# Patient Record
Sex: Female | Born: 1960 | Race: Black or African American | Hispanic: No | Marital: Married | Smoking: Never smoker
Health system: Southern US, Community
[De-identification: ages and names within clinical notes are randomized; demographics above are authoritative.]

## PROBLEM LIST (undated history)

## (undated) DIAGNOSIS — H269 Unspecified cataract: Secondary | ICD-10-CM

## (undated) DIAGNOSIS — R32 Unspecified urinary incontinence: Secondary | ICD-10-CM

## (undated) DIAGNOSIS — M1990 Unspecified osteoarthritis, unspecified site: Secondary | ICD-10-CM

## (undated) DIAGNOSIS — L509 Urticaria, unspecified: Secondary | ICD-10-CM

## (undated) DIAGNOSIS — R4789 Other speech disturbances: Secondary | ICD-10-CM

## (undated) DIAGNOSIS — T7840XA Allergy, unspecified, initial encounter: Secondary | ICD-10-CM

## (undated) DIAGNOSIS — N949 Unspecified condition associated with female genital organs and menstrual cycle: Secondary | ICD-10-CM

## (undated) DIAGNOSIS — E669 Obesity, unspecified: Secondary | ICD-10-CM

## (undated) DIAGNOSIS — N938 Other specified abnormal uterine and vaginal bleeding: Secondary | ICD-10-CM

## (undated) DIAGNOSIS — N63 Unspecified lump in unspecified breast: Secondary | ICD-10-CM

## (undated) DIAGNOSIS — F40240 Claustrophobia: Secondary | ICD-10-CM

## (undated) DIAGNOSIS — H532 Diplopia: Secondary | ICD-10-CM

## (undated) DIAGNOSIS — N925 Other specified irregular menstruation: Secondary | ICD-10-CM

## (undated) DIAGNOSIS — I10 Essential (primary) hypertension: Secondary | ICD-10-CM

## (undated) HISTORY — PX: WISDOM TOOTH EXTRACTION: SHX21

## (undated) HISTORY — DX: Unspecified osteoarthritis, unspecified site: M19.90

## (undated) HISTORY — DX: Allergy, unspecified, initial encounter: T78.40XA

## (undated) HISTORY — PX: COLONOSCOPY: SHX174

## (undated) HISTORY — DX: Claustrophobia: F40.240

## (undated) HISTORY — DX: Obesity, unspecified: E66.9

## (undated) HISTORY — DX: Unspecified condition associated with female genital organs and menstrual cycle: N94.9

## (undated) HISTORY — DX: Other specified irregular menstruation: N92.5

## (undated) HISTORY — DX: Other specified abnormal uterine and vaginal bleeding: N93.8

## (undated) HISTORY — DX: Urticaria, unspecified: L50.9

## (undated) HISTORY — DX: Unspecified cataract: H26.9

## (undated) HISTORY — DX: Essential (primary) hypertension: I10

## (undated) HISTORY — DX: Other speech disturbances: R47.89

## (undated) HISTORY — DX: Diplopia: H53.2

## (undated) HISTORY — DX: Unspecified lump in unspecified breast: N63.0

## (undated) HISTORY — PX: BREAST BIOPSY: SHX20

---

## 2000-08-11 ENCOUNTER — Other Ambulatory Visit: Admission: RE | Admit: 2000-08-11 | Discharge: 2000-08-11 | Payer: Self-pay | Admitting: Obstetrics and Gynecology

## 2000-08-18 ENCOUNTER — Encounter: Payer: Self-pay | Admitting: Obstetrics and Gynecology

## 2000-08-18 ENCOUNTER — Ambulatory Visit (HOSPITAL_COMMUNITY): Admission: RE | Admit: 2000-08-18 | Discharge: 2000-08-18 | Payer: Self-pay | Admitting: Obstetrics and Gynecology

## 2001-08-17 ENCOUNTER — Other Ambulatory Visit: Admission: RE | Admit: 2001-08-17 | Discharge: 2001-08-17 | Payer: Self-pay | Admitting: Obstetrics and Gynecology

## 2001-10-01 ENCOUNTER — Encounter: Admission: RE | Admit: 2001-10-01 | Discharge: 2001-10-01 | Payer: Self-pay | Admitting: Obstetrics and Gynecology

## 2001-10-01 ENCOUNTER — Encounter: Payer: Self-pay | Admitting: Obstetrics and Gynecology

## 2002-08-20 ENCOUNTER — Other Ambulatory Visit: Admission: RE | Admit: 2002-08-20 | Discharge: 2002-08-20 | Payer: Self-pay | Admitting: Obstetrics and Gynecology

## 2003-02-14 ENCOUNTER — Ambulatory Visit (HOSPITAL_COMMUNITY): Admission: RE | Admit: 2003-02-14 | Discharge: 2003-02-14 | Payer: Self-pay | Admitting: Cardiology

## 2003-06-12 ENCOUNTER — Encounter: Payer: Self-pay | Admitting: Obstetrics and Gynecology

## 2003-06-12 ENCOUNTER — Encounter: Admission: RE | Admit: 2003-06-12 | Discharge: 2003-06-12 | Payer: Self-pay | Admitting: Obstetrics and Gynecology

## 2003-12-22 ENCOUNTER — Other Ambulatory Visit: Admission: RE | Admit: 2003-12-22 | Discharge: 2003-12-22 | Payer: Self-pay | Admitting: Obstetrics and Gynecology

## 2005-07-01 ENCOUNTER — Encounter: Admission: RE | Admit: 2005-07-01 | Discharge: 2005-07-01 | Payer: Self-pay | Admitting: Obstetrics and Gynecology

## 2005-07-18 ENCOUNTER — Other Ambulatory Visit: Admission: RE | Admit: 2005-07-18 | Discharge: 2005-07-18 | Payer: Self-pay | Admitting: Obstetrics and Gynecology

## 2005-07-27 ENCOUNTER — Ambulatory Visit (HOSPITAL_COMMUNITY): Admission: RE | Admit: 2005-07-27 | Discharge: 2005-07-27 | Payer: Self-pay | Admitting: Obstetrics and Gynecology

## 2006-04-10 ENCOUNTER — Ambulatory Visit: Payer: Self-pay | Admitting: Family Medicine

## 2006-11-08 ENCOUNTER — Encounter: Admission: RE | Admit: 2006-11-08 | Discharge: 2006-11-08 | Payer: Self-pay | Admitting: Obstetrics and Gynecology

## 2007-05-09 ENCOUNTER — Ambulatory Visit: Payer: Self-pay | Admitting: Family Medicine

## 2007-05-09 LAB — CONVERTED CEMR LAB
ALT: 16 units/L (ref 0–40)
Basophils Absolute: 0 10*3/uL (ref 0.0–0.1)
Basophils Relative: 0.5 % (ref 0.0–1.0)
CO2: 26 meq/L (ref 19–32)
Calcium: 9.6 mg/dL (ref 8.4–10.5)
Cholesterol: 210 mg/dL (ref 0–200)
Eosinophils Absolute: 0.1 10*3/uL (ref 0.0–0.6)
Eosinophils Relative: 2.2 % (ref 0.0–5.0)
GFR calc Af Amer: 76 mL/min
GFR calc non Af Amer: 63 mL/min
HCT: 38.9 % (ref 36.0–46.0)
HDL: 92.5 mg/dL (ref >39.0)
Hemoglobin: 13 g/dL (ref 12.0–15.0)
Lymphocytes Relative: 33.1 % (ref 12.0–46.0)
MCHC: 33.5 g/dL (ref 30.0–36.0)
Monocytes Absolute: 0.5 10*3/uL (ref 0.2–0.7)
Neutro Abs: 3.4 10*3/uL (ref 1.4–7.7)
Platelets: 238 10*3/uL (ref 150–400)
RBC: 4.55 M/uL (ref 3.87–5.11)
RDW: 13.1 % (ref 11.5–14.6)
Sodium: 139 meq/L (ref 135–145)
TSH: 0.98 microintl units/mL (ref 0.35–5.50)
Triglycerides: 58 mg/dL (ref 0–149)

## 2007-05-17 ENCOUNTER — Ambulatory Visit: Payer: Self-pay | Admitting: Family Medicine

## 2007-11-07 ENCOUNTER — Encounter: Admission: RE | Admit: 2007-11-07 | Discharge: 2007-11-07 | Payer: Self-pay | Admitting: Obstetrics and Gynecology

## 2007-11-21 ENCOUNTER — Ambulatory Visit: Payer: Self-pay | Admitting: Family Medicine

## 2007-11-21 LAB — CONVERTED CEMR LAB
Mumps IgG: 3.44 — ABNORMAL HIGH
Rubeola IgG: 3.01 — ABNORMAL HIGH

## 2007-11-28 ENCOUNTER — Telehealth: Payer: Self-pay | Admitting: *Deleted

## 2007-12-17 ENCOUNTER — Telehealth: Payer: Self-pay | Admitting: Family Medicine

## 2008-03-26 ENCOUNTER — Telehealth: Payer: Self-pay | Admitting: Family Medicine

## 2008-04-04 ENCOUNTER — Ambulatory Visit: Payer: Self-pay | Admitting: Family Medicine

## 2008-05-13 ENCOUNTER — Ambulatory Visit: Payer: Self-pay | Admitting: Family Medicine

## 2008-05-27 ENCOUNTER — Ambulatory Visit: Payer: Self-pay | Admitting: Family Medicine

## 2008-05-27 LAB — CONVERTED CEMR LAB
Albumin: 3.7 g/dL (ref 3.5–5.2)
Alkaline Phosphatase: 44 units/L (ref 39–117)
Basophils Relative: 0.2 % (ref 0.0–1.0)
Bilirubin Urine: NEGATIVE
Chloride: 105 meq/L (ref 96–112)
Creatinine, Ser: 1 mg/dL (ref 0.4–1.2)
GFR calc Af Amer: 76 mL/min
Glucose, Urine, Semiquant: NEGATIVE
HDL: 78.6 mg/dL (ref >39.0)
Hemoglobin: 12.3 g/dL (ref 12.0–15.0)
Lymphocytes Relative: 37.3 % (ref 12.0–46.0)
MCHC: 33.5 g/dL (ref 30.0–36.0)
Monocytes Absolute: 0.5 10*3/uL (ref 0.1–1.0)
Monocytes Relative: 9.9 % (ref 3.0–12.0)
Neutro Abs: 2.6 10*3/uL (ref 1.4–7.7)
Neutrophils Relative %: 50.6 % (ref 43.0–77.0)
Nitrite: NEGATIVE
Platelets: 208 10*3/uL (ref 150–400)
Potassium: 4.1 meq/L (ref 3.5–5.1)
RBC: 4.22 M/uL (ref 3.87–5.11)
Specific Gravity, Urine: 1.02
Triglycerides: 35 mg/dL (ref 0–149)
VLDL: 7 mg/dL (ref 0–40)
WBC Urine, dipstick: NEGATIVE
WBC: 5.1 10*3/uL (ref 4.5–10.5)
pH: 6.5

## 2008-06-02 ENCOUNTER — Other Ambulatory Visit: Admission: RE | Admit: 2008-06-02 | Discharge: 2008-06-02 | Payer: Self-pay | Admitting: Family Medicine

## 2008-06-02 ENCOUNTER — Encounter: Payer: Self-pay | Admitting: Family Medicine

## 2008-06-02 ENCOUNTER — Ambulatory Visit: Payer: Self-pay | Admitting: Family Medicine

## 2009-02-17 ENCOUNTER — Ambulatory Visit: Payer: Self-pay | Admitting: Family Medicine

## 2009-05-20 ENCOUNTER — Telehealth: Payer: Self-pay | Admitting: *Deleted

## 2009-06-26 ENCOUNTER — Telehealth (INDEPENDENT_AMBULATORY_CARE_PROVIDER_SITE_OTHER): Payer: Self-pay | Admitting: *Deleted

## 2009-06-29 ENCOUNTER — Encounter: Admission: RE | Admit: 2009-06-29 | Discharge: 2009-06-29 | Payer: Self-pay | Admitting: Family Medicine

## 2009-06-29 ENCOUNTER — Ambulatory Visit: Payer: Self-pay | Admitting: Family Medicine

## 2009-06-29 LAB — CONVERTED CEMR LAB
ALT: 15 U/L (ref 0–35)
AST: 17 U/L (ref 0–37)
Albumin: 3.5 g/dL (ref 3.5–5.2)
Alkaline Phosphatase: 44 U/L (ref 39–117)
BUN: 19 mg/dL (ref 6–23)
Basophils Absolute: 0 K/uL (ref 0.0–0.1)
Basophils Relative: 0.3 % (ref 0.0–3.0)
Bilirubin Urine: NEGATIVE
Bilirubin, Direct: 0.1 mg/dL (ref 0.0–0.3)
CO2: 30 meq/L (ref 19–32)
Calcium: 9.1 mg/dL (ref 8.4–10.5)
Chloride: 110 meq/L (ref 96–112)
Cholesterol: 178 mg/dL (ref 0–200)
Creatinine, Ser: 1 mg/dL (ref 0.4–1.2)
Eosinophils Absolute: 0.1 K/uL (ref 0.0–0.7)
Eosinophils Relative: 2.3 % (ref 0.0–5.0)
Folate: 11.9 ng/mL
GFR calc non Af Amer: 75.44 mL/min (ref >60)
Glucose, Bld: 93 mg/dL (ref 70–99)
HCT: 37 % (ref 36.0–46.0)
HDL: 88.1 mg/dL (ref >39.00)
Hemoglobin, Urine: NEGATIVE
Hemoglobin: 12.4 g/dL (ref 12.0–15.0)
Iron: 97 ug/dL (ref 42–145)
Ketones, ur: NEGATIVE mg/dL
LDL Cholesterol: 85 mg/dL (ref 0–99)
Leukocytes, UA: NEGATIVE
Lymphocytes Relative: 35.7 % (ref 12.0–46.0)
Lymphs Abs: 2.3 K/uL (ref 0.7–4.0)
MCHC: 33.5 g/dL (ref 30.0–36.0)
MCV: 85.7 fL (ref 78.0–100.0)
Monocytes Absolute: 0.7 K/uL (ref 0.1–1.0)
Monocytes Relative: 11.2 % (ref 3.0–12.0)
Neutro Abs: 3.3 K/uL (ref 1.4–7.7)
Neutrophils Relative %: 50.5 % (ref 43.0–77.0)
Nitrite: NEGATIVE
Platelets: 198 K/uL (ref 150.0–400.0)
Potassium: 4.2 meq/L (ref 3.5–5.1)
RBC: 4.32 M/uL (ref 3.87–5.11)
RDW: 12.8 % (ref 11.5–14.6)
Saturation Ratios: 31.5 % (ref 20.0–50.0)
Sodium: 144 meq/L (ref 135–145)
Specific Gravity, Urine: 1.02 (ref 1.000–1.030)
TSH: 0.8 u[IU]/mL (ref 0.35–5.50)
Total Bilirubin: 0.7 mg/dL (ref 0.3–1.2)
Total CHOL/HDL Ratio: 2
Total Protein, Urine: NEGATIVE mg/dL
Total Protein: 6.8 g/dL (ref 6.0–8.3)
Transferrin: 220 mg/dL (ref 212.0–360.0)
Triglycerides: 26 mg/dL (ref 0.0–149.0)
Urine Glucose: NEGATIVE mg/dL
Urobilinogen, UA: 0.2 (ref 0.0–1.0)
VLDL: 5.2 mg/dL (ref 0.0–40.0)
Vitamin B-12: 529 pg/mL (ref 211–911)
WBC: 6.4 10*3/microliter (ref 4.5–10.5)
pH: 6.5 (ref 5.0–8.0)

## 2009-07-06 ENCOUNTER — Ambulatory Visit: Payer: Self-pay | Admitting: Family Medicine

## 2009-07-06 ENCOUNTER — Other Ambulatory Visit: Admission: RE | Admit: 2009-07-06 | Discharge: 2009-07-06 | Payer: Self-pay | Admitting: Family Medicine

## 2009-07-06 ENCOUNTER — Encounter: Payer: Self-pay | Admitting: Family Medicine

## 2009-07-06 DIAGNOSIS — N952 Postmenopausal atrophic vaginitis: Secondary | ICD-10-CM

## 2009-07-17 ENCOUNTER — Ambulatory Visit: Payer: Self-pay | Admitting: Gastroenterology

## 2009-07-29 ENCOUNTER — Ambulatory Visit: Payer: Self-pay | Admitting: Gastroenterology

## 2010-03-08 ENCOUNTER — Telehealth: Payer: Self-pay | Admitting: Family Medicine

## 2010-03-17 ENCOUNTER — Encounter: Payer: Self-pay | Admitting: Family Medicine

## 2010-03-30 ENCOUNTER — Encounter: Payer: Self-pay | Admitting: Family Medicine

## 2010-03-30 DIAGNOSIS — N852 Hypertrophy of uterus: Secondary | ICD-10-CM

## 2010-03-31 ENCOUNTER — Encounter: Admission: RE | Admit: 2010-03-31 | Discharge: 2010-03-31 | Payer: Self-pay | Admitting: Family Medicine

## 2010-04-01 ENCOUNTER — Ambulatory Visit: Payer: Self-pay | Admitting: Family Medicine

## 2010-10-20 ENCOUNTER — Telehealth: Payer: Self-pay | Admitting: Family Medicine

## 2010-10-25 ENCOUNTER — Encounter: Admission: RE | Admit: 2010-10-25 | Discharge: 2010-10-25 | Payer: Self-pay | Admitting: Family Medicine

## 2010-11-10 ENCOUNTER — Encounter: Payer: Self-pay | Admitting: Family Medicine

## 2010-11-25 ENCOUNTER — Ambulatory Visit: Payer: Self-pay | Admitting: Family Medicine

## 2010-11-25 ENCOUNTER — Other Ambulatory Visit
Admission: RE | Admit: 2010-11-25 | Discharge: 2010-11-25 | Payer: Self-pay | Source: Home / Self Care | Admitting: Family Medicine

## 2010-11-25 ENCOUNTER — Encounter: Payer: Self-pay | Admitting: Family Medicine

## 2010-11-25 LAB — CONVERTED CEMR LAB: Pap Smear: NEGATIVE

## 2011-01-11 NOTE — Miscellaneous (Signed)
Summary: Orders Update  Clinical Lists Changes  Problems: Added new problem of UTERINE ENLARGEMENT (ICD-621.2) Orders: Added new Referral order of Misc. Referral (Misc. Ref) - Signed

## 2011-01-11 NOTE — Progress Notes (Signed)
Summary: fluconazole rx  Phone Note From Pharmacy   Summary of Call: patient is requesting an rx for fluconazole is this okay to fill? Initial call taken by: Kern Reap CMA Duncan Dull),  March 08, 2010 12:55 PM  Follow-up for Phone Call        Fleet Contras please call....... what's the reason?????? Follow-up by: Roderick Pee MD,  March 08, 2010 1:48 PM  Additional Follow-up for Phone Call Additional follow up Details #1::        left message on machine for patient  Additional Follow-up by: Kern Reap CMA Duncan Dull),  March 08, 2010 3:15 PM    Additional Follow-up for Phone Call Additional follow up Details #2::    left message for pt to call back and faxed request to pharmacy Follow-up by: Kern Reap CMA Duncan Dull),  March 09, 2010 1:46 PM

## 2011-01-11 NOTE — Progress Notes (Signed)
Summary: Pt is req to get order for bone density at time of mammogram  Phone Note Call from Patient Call back at [phone] cell   Caller: Patient Summary of Call: Pt is req to get a bone density test done at time of mammogram on 10/25/10 at [facility] or pt can have it done at her job. Pt works for [employer].   Pt has sch a cpx with Dr. Tawanna Cooler on 11/25/10 and is wanting to know if she can get an order to have cpx labs done at her work? Pls advise.  Initial call taken by: Lucy Antigua,  October 20, 2010 4:40 PM  Follow-up for Phone Call        Fleet Contras please call bone density.  No problem..................  She wants to get it done at work........Marland Kitchen  Also ok to  get all her CPX lab [place] or Follow-up by: Roderick Pee MD,  October 21, 2010 3:09 PM  Additional Follow-up for Phone Call Additional follow up Details #1::        Phone Call Completed Additional Follow-up by: Kern Reap CMA Duncan Dull),  October 21, 2010 4:28 PM

## 2011-01-11 NOTE — Assessment & Plan Note (Signed)
Summary: 2 day rov/njr   Vital Signs:  Patient profile:   50 year old female Menstrual status:  irregular Height:      66 inches Weight:      187 pounds BMI:     30.29 Temp:     98.1 degrees F oral BP sitting:   110 / 80  (left arm) Cuff size:   regular  Vitals Entered By: Kern Reap CMA Duncan Dull) (April 01, 2010 1:59 PM) CC: follow-up visit   CC:  follow-up visit.  History of Present Illness: Karen Pope is a 50 year old female, who comes in today for follow-up of an abnormal ultrasound.  She was noted to have an enlarged uterus.  She is asymptomatic.  Ultrasound was done to document enlargement and to rule out cancer.  Her ultrasound shows an enlarged uterus, but normal ovaries.  We discussed various options,since she is asymptomatic.  She elects to watch for weight, which we will do.  Allergies: 1)  ! Mersol 2)  ! * Thrimrasol  Review of Systems      See HPI  Physical Exam  General:  Well-developed,well-nourished,in no acute distress; alert,appropriate and cooperative throughout examination   Impression & Recommendations:  Problem # 1:  UTERINE ENLARGEMENT (ICD-621.2) Assessment Unchanged  Complete Medication List: 1)  Zestoretic 20-25 Mg Tabs (Lisinopril-hydrochlorothiazide) .... Take 1 tablet by mouth every morning 2)  Centrum Ultra Womens Tabs (Multiple vitamins-minerals) .... Once daily 3)  Premarin 0.625 Mg/gm Crea (Estrogens, conjugated) .... Uad  Patient Instructions: 1)   returned in the summer for your annual exam.  Sooner if any symptoms of pain and bloating dysfunction uterine bleeding, etc.

## 2011-01-13 NOTE — Assessment & Plan Note (Signed)
Summary: cpx/pap/cjr   Vital Signs:  Patient profile:   50 year old female Menstrual status:  irregular Height:      66 inches Weight:      186 pounds Temp:     98.4 degrees F oral Pulse rate:   86 / minute BP sitting:   112 / 76  (left arm) Cuff size:   regular  Vitals Entered By: Kathlene November LPN (November 25, 2010 4:48 PM) CC: CPE   CC:  CPE.  History of Present Illness: Karen Pope is a 50 year old, married female, nonsmoker........ who just completed a nurse practitioner degree........ who comes in today for annual physical examination because of underlying hypertension.  She takes Tenoretic 50 -- 25 daily for hypertension.  BP 112/76.  She takes calcium and vitamin D.  She gets routine eye care, dental care, BSE monthly, and a mammography, colonoscopy, normal, tetanus, 2000, seasonal flu 2011  Review of systems her major concern is the persistent tinnitus.  She had an ENT evaluation.  A couple years ago, however, she feels like her hearing is getting worse, and it tended to [place] is getting louder.  Current Medications (verified): 1)  Zestoretic 20-25 Mg  Tabs (Lisinopril-Hydrochlorothiazide) .... Take 1 Tablet By Mouth Every Morning 2)  Centrum Ultra Womens  Tabs (Multiple Vitamins-Minerals) .... Once Daily  Allergies (verified): 1)  ! Mersol 2)  ! * Thrimrasol  Comments:  Nurse/Medical Assistant: The patient's medications and allergies were reviewed with the patient and were updated in the Medication and Allergy Lists. Kathlene November LPN (November 25, 2010 4:49 PM)  Past History:  Past medical, surgical, family and social histories (including risk factors) reviewed, and no changes noted (except as noted below).  Past Medical History: Reviewed history from 06/02/2008 and no changes required. tonsillectomy childbirth x 50, twins 50 years old, going to college D&C x 2 one hospitalization for preterm labor one miscarriage Hypertension  Family History: Reviewed history  from 04/04/2008 and no changes required. Family History Breast cancer 1st degree relative <50 Family History Diabetes 1st degree relative Family History Hypertension  Social History: Reviewed history from 06/02/2008 and no changes required. Occupation:RN Married Never Smoked Alcohol use-no Drug use-no Regular exercise-no  Review of Systems      See HPI  Physical Exam  General:  Well-developed,well-nourished,in no acute distress; alert,appropriate and cooperative throughout examination Head:  Normocephalic and atraumatic without obvious abnormalities. No apparent alopecia or balding. Eyes:  No corneal or conjunctival inflammation noted. EOMI. Perrla. Funduscopic exam benign, without hemorrhages, exudates or papilledema. Vision grossly normal. Ears:  External ear exam shows no significant lesions or deformities.  Otoscopic examination reveals clear canals, tympanic membranes are intact bilaterally without bulging, retraction, inflammation or discharge. Hearing is grossly normal bilaterally. Nose:  External nasal examination shows no deformity or inflammation. Nasal mucosa are pink and moist without lesions or exudates. Mouth:  Oral mucosa and oropharynx without lesions or exudates.  Teeth in good repair. Neck:  No deformities, masses, or tenderness noted. Chest Wall:  No deformities, masses, or tenderness noted. Breasts:  No mass, nodules, thickening, tenderness, bulging, retraction, inflamation, nipple discharge or skin changes noted.   Lungs:  Normal respiratory effort, chest expands symmetrically. Lungs are clear to auscultation, no crackles or wheezes. Heart:  Normal rate and regular rhythm. S1 and S2 normal without gallop, murmur, click, rub or other extra sounds. Abdomen:  Bowel sounds positive,abdomen soft and non-tender without masses, organomegaly or hernias noted. Rectal:  No external abnormalities noted. Normal sphincter  tone. No rectal masses or tenderness. Genitalia:   Pelvic Exam:        External: normal female genitalia without lesions or masses        Vagina: normal without lesions or masses        Cervix: normal without lesions or masses        Adnexa: normal bimanual exam without masses or fullness        Uterus: normal by palpation        Pap smear: performed Msk:  No deformity or scoliosis noted of thoracic or lumbar spine.   Pulses:  R and L carotid,radial,femoral,dorsalis pedis and posterior tibial pulses are full and equal bilaterally Extremities:  No clubbing, cyanosis, edema, or deformity noted with normal full range of motion of all joints.   Neurologic:  No cranial nerve deficits noted. Station and gait are normal. Plantar reflexes are down-going bilaterally. DTRs are symmetrical throughout. Sensory, motor and coordinative functions appear intact. Skin:  Intact without suspicious lesions or rashes Cervical Nodes:  No lymphadenopathy noted Axillary Nodes:  No palpable lymphadenopathy Inguinal Nodes:  No significant adenopathy Psych:  Cognition and judgment appear intact. Alert and cooperative with normal attention span and concentration. No apparent delusions, illusions, hallucinations   Impression & Recommendations:  Problem # 1:  HYPERTENSION (ICD-401.9) Assessment Improved  Her updated medication list for this problem includes:    Zestoretic 20-25 Mg Tabs (Lisinopril-hydrochlorothiazide) .Marland Kitchen... Take 1 tablet by mouth every morning    Lasix 20 Mg Tabs (Furosemide) .Marland Kitchen... Take 1 tablet by mouth every morning as needed  Her updated medication list for this problem includes:    Zestoretic 20-25 Mg Tabs (Lisinopril-hydrochlorothiazide) .Marland Kitchen... Take 1 tablet by mouth every morning  Orders: Prescription Created Electronically [id number])  Problem # 2:  Preventive Health Care (ICD-V70.0) Assessment: Unchanged  Complete Medication List: 1)  Zestoretic 20-25 Mg Tabs (Lisinopril-hydrochlorothiazide) .... Take 1 tablet by mouth every morning 2)   Centrum Ultra Womens Tabs (Multiple vitamins-minerals) .... Once daily 3)  Lasix 20 Mg Tabs (Furosemide) .... Take 1 tablet by mouth every morning as needed 4)  Premarin 0.625 Mg/gm Crea (Estrogens, conjugated) .... Apply 2 x week  Patient Instructions: 1)  Please schedule a follow-up appointment in 1 year. 2)  Please schedule a follow-up appointment as needed. 3)  It is important that you exercise regularly at least 20 minutes 5 times a week. If you develop chest pain, have severe difficulty breathing, or feel very tired , stop exercising immediately and seek medical attention. 4)  Schedule your mammogram. 5)  Schedule a colonoscopy/sigmoidoscopy to help detect colon cancer. 6)  Take calcium +Vitamin D daily. 7)  Take an Aspirin every day. Prescriptions: PREMARIN 0.625 MG/GM CREA (ESTROGENS, CONJUGATED) apply 2 x week  #2 tubes x 6   Entered and Authorized by:   Roderick Pee MD   Signed by:   Roderick Pee MD on 11/25/2010   Method used:   Electronically to        [facility] #[id number]* (retail)       [address]       [place], [place]  [address]       Ph: [phone]       Fax: [phone]   RxID:   [id number] ZESTORETIC 20-25 MG  TABS (LISINOPRIL-HYDROCHLOROTHIAZIDE) Take 1 tablet by mouth every morning  #100 x 3   Entered and Authorized by:   Tinnie Gens  Shawnie Dapper MD   Signed by:   Roderick Pee MD on 11/25/2010   Method used:   Electronically to        [facility] #[id number]* (retail)       [address]       [place], [place]  [address]       Ph: [phone]       Fax: [phone]   RxID:   [id number] LASIX 20 MG TABS (FUROSEMIDE) Take 1 tablet by mouth every morning as needed  #30 x 3   Entered and Authorized by:   Roderick Pee MD   Signed by:   Roderick Pee MD on 11/25/2010   Method used:   Electronically to        [facility] #[id number]* (retail)       [address]       [place], [place]  [address]       Ph: [phone]       Fax: [phone]   RxID:   [id number]    Orders Added: 1)  Prescription Created Electronically [[id number]] 2)  Est. Patient 40-64 years [phone]

## 2011-04-29 NOTE — Cardiovascular Report (Signed)
   NAME:  Karen Pope, Karen Pope                         ACCOUNT NO.:  [id number]   MEDICAL RECORD NO.:  [id number]                   PATIENT TYPE:  OIB   LOCATION:  2899                                 FACILITY:  [facility]   PHYSICIAN:  Madaline Savage, M.D.             DATE OF BIRTH:  [birth year]   DATE OF PROCEDURE:  02/14/2003  DATE OF DISCHARGE:                              CARDIAC CATHETERIZATION   PROCEDURES PERFORMED:  1. Selective coronary angiography by Judkins technique.  2. Retrograde left heart catheterization.  3. Left ventriculography.   COMPLICATIONS:  None.   ENTRY SITE:  Right femoral.   DYE USED:  Omnipaque.   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  The patient has had a negative stress test for ischemia by  Cardiolite and ejection fraction was 68%.  She does have a father who died  at age 66 with hypertension and congestive heart failure and kidney failure.  The patient was given informed consent and decided to go ahead with  outpatient cardiac catheterization to assess her chest pain.  This procedure  was performed using 5-French catheters.   RESULTS:  The left ventricular pressure was 145/15, end-diastolic pressure  15, central aortic pressure was 130/85, mean of 100.  No significant aortic  valve gradient by pullback technique.   ANGIOGRAPHIC RESULTS:  The left main coronary artery appeared normal.   The left anterior descending coronary artery coursed to the cardiac apex and  gave rise to a single large diagonal branch.  Both LAD and diagonal were  normal.   The circumflex gave rise to two obtuse marginal branches arising very  proximally on the circumflex itself, both normal.  A third obtuse marginal  branch arose from the mid-circumflex and it too was normal.  All three  obtuse marginal branches and circumflex itself were normal.   The right  coronary artery was medium-sized, was dominant and was entirely  normal.   The left ventricle showed excellent contractility without wall motion  abnormalities and ejection fraction was 60% without mitral regurgitation.    FINAL DIAGNOSES:  1. Angiographically patent coronary arteriography.  2. Normal left ventricular systolic function.   PLAN:  Reassurance.                                               Madaline Savage, M.D.    WHG/MEDQ  D:  02/14/2003  T:  02/15/2003  Job:  [id number]   cc:   Leanna Sato., M.D.   [facility]

## 2011-04-29 NOTE — H&P (Signed)
NAME:  Karen Pope, Karen Pope                         ACCOUNT NO.:  [id number]   MEDICAL RECORD NO.:  [id number]                   PATIENT TYPE:  OIB   LOCATION:  2899                                 FACILITY:  [facility]   PHYSICIAN:  Madaline Savage, M.D.             DATE OF BIRTH:  [birth year]   DATE OF ADMISSION:  02/14/2003  DATE OF DISCHARGE:                                HISTORY & PHYSICAL   CHIEF COMPLAINT:  Chest pain.   HISTORY OF PRESENT ILLNESS:  Karen Pope is a 50 year old female who was  referred to Karen Pope by Karen Pope, primary care physician, for  evaluation of chest pain.   Apparently, her primary care physician had scheduled her for a Cardiolite  scan prior to her office visit with Karen Pope.  She underwent Cardiolite  scan on 12/03/02 that showed normal perfusion and EF of 68%.   However, when she saw Karen Pope for follow up office evaluation on 01/13/03,  she was still complaining of chest pain.  She continued to experience  substernal chest pain which occasionally radiated to her back, associated  with shortness of breath, but no nausea, vomiting, or diaphoresis.  With  activity, the heaviness did increase, originally occurring 3-5 times a week,  but the starting in mid-January decreased to about once a week.  Prior to  November, she did not experience any chest pain like this.   PAST MEDICAL HISTORY:  1. History of tonsillectomy in 1979.  2. History of D&C in 1991.  3. History of D&C in 1994 secondary to miscarriage.  4. History of tubal ligation in 1996.  5. Occasional palpitation.  6. Irritable bowel syndrome.  7. Lactose intolerance.  8. Irregular menses.   ALLERGIES:  1. ________ SOLUTION.  2. Allergy to FISH.   SOCIAL HISTORY:  She is married for 18 years with three children.  She lives  with spouse and children.  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  She never uses any alcohol or  tobacco.   Her only exercise is at work and with normal daily activities.  No  formal exercise.   FAMILY HISTORY:  Mother is living at age 13 with hypertension, dementia, and  left breast cancer.  Father died at age 22 with history of hypertension and  diabetes and CHF and renal failure.  Maternal grandfather had an MI at age  46.  She has two brothers, ages 46 and 45, who are healthy.  She has two  sons, ages 46, and one daughter age 13.  All are healthy.   REVIEW OF SYSTEMS:  She has had no recent weight loss.  SKIN:  Negative.  RESPIRATIONS:  No asthma.  Some shortness of breath with activity.  CARDIOVASCULAR:  As above.  GI:  No indigestion, but sometimes feels as if  there is some food stuck in  her esophagus.  As long as she has no dairy  products, she has no significant diarrhea or constipation.  GU:  No dysuria,  no hematuria.  MUSCULOSKELETAL:  Negative.  ENDOCRINE:  No diabetes, no  thyroid disease.  HEME:  No anemias.  She has had a tubal ligation and  denies any pregnancy.   PHYSICAL EXAMINATION:  VITAL SIGNS:  Blood pressure is 138/84.  Weight is  174 pounds.  Height is 5'7.  Heart rate is 74.  GENERAL:  She is a well-nourished, well-developed African-American female  who appears in no acute distress.  NECK:  Supple.  No JVD.  Carotid pulses without bruits.  HEART:  A regular rhythm without murmur, rub, gallop, or click.  LUNGS:  Clear bilaterally.  ABDOMEN:  Soft without mass or organomegaly.  There is no lower extremity  edema.  She has 2+ peripheral pulses.   The last EKG shows normal sinus rhythm.  No significant ST-T change.   IMPRESSION AND PLAN:  1. Chest pain.  2. Palpitations.  3. Status post Cardiolite scan 12/03/02 that showed no ischemia and normal     left ventricular function.   Despite her past Cardiolite scan showing no significant ischemia, Karen Pope  chest pain was concerning, and she was continuing to have chest pain.  Therefore, Karen Pope felt that we needed  to proceed with definitive cardiac  catheterization.  Risks and benefits of the procedure were discussed with  the patient, and she was willing to proceed.  She now presents today for  diagnostic cardiac catheterization by Karen Pope.   Please note that Karen Pope did see and evaluate the patient as above.     Mary B. Easley, P.A.-C.                   Madaline Savage, M.D.    MBE/MEDQ  D:  02/14/2003  T:  02/15/2003  Job:  [id number]

## 2011-10-11 ENCOUNTER — Telehealth: Payer: Self-pay | Admitting: Family Medicine

## 2011-10-11 NOTE — Telephone Encounter (Signed)
ok 

## 2011-10-11 NOTE — Telephone Encounter (Signed)
Left message on machine for patient to call back with a fax number

## 2011-10-11 NOTE — Telephone Encounter (Signed)
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

## 2011-11-29 ENCOUNTER — Telehealth: Payer: Self-pay | Admitting: Family Medicine

## 2011-11-29 NOTE — Telephone Encounter (Addendum)
Pt would like to know whether doc todd needs to see her for depression. Pt is aware doc will request ov. Pt request to talk to rachel. Pt is NP unable to come.

## 2011-12-01 MED ORDER — PAROXETINE HCL 20 MG PO TABS: 20.0000 mg | ORAL_TABLET | ORAL | Status: DC

## 2011-12-01 NOTE — Telephone Encounter (Signed)
Spoke with patient and she is feeling "down, over whelmed, hard time concentrating, not sleeping well".  Her CPX is scheduled for next month.  She has taken Paxil in the past and it worked well but she is willing to try something else if suggested.

## 2011-12-01 NOTE — Telephone Encounter (Signed)
Okay to restart the Paxil, 20 mg one tablet nightly at bedtime give her 30 tabs and asked her to come see [place].  The first full weekend, January, for follow-up

## 2011-12-02 NOTE — Telephone Encounter (Signed)
Spoke with patient and Rx sent °

## 2011-12-26 ENCOUNTER — Encounter: Payer: Self-pay | Admitting: Family Medicine

## 2011-12-26 ENCOUNTER — Other Ambulatory Visit (HOSPITAL_COMMUNITY)
Admission: RE | Admit: 2011-12-26 | Discharge: 2011-12-26 | Disposition: A | Discharge status: Home / Self Care | Payer: Commercial Managed Care - PPO | Source: Ambulatory Visit | Attending: Family Medicine | Admitting: Family Medicine

## 2011-12-26 ENCOUNTER — Ambulatory Visit (INDEPENDENT_AMBULATORY_CARE_PROVIDER_SITE_OTHER): Payer: Commercial Managed Care - PPO | Admitting: Family Medicine

## 2011-12-26 DIAGNOSIS — Z01419 Encounter for gynecological examination (general) (routine) without abnormal findings: Secondary | ICD-10-CM | POA: Insufficient documentation

## 2011-12-26 DIAGNOSIS — F329 Major depressive disorder, single episode, unspecified: Secondary | ICD-10-CM

## 2011-12-26 DIAGNOSIS — Z0000 Encounter for general adult medical examination without abnormal findings: Secondary | ICD-10-CM

## 2011-12-26 DIAGNOSIS — I10 Essential (primary) hypertension: Secondary | ICD-10-CM

## 2011-12-26 MED ORDER — LISINOPRIL-HYDROCHLOROTHIAZIDE 20-25 MG PO TABS: 1.0000 | ORAL_TABLET | Freq: Every day | ORAL | Status: DC

## 2011-12-26 MED ORDER — PAROXETINE HCL 20 MG PO TABS: 20.0000 mg | ORAL_TABLET | ORAL | Status: DC

## 2011-12-26 NOTE — Progress Notes (Signed)
  Subjective:    Patient ID: Karen Pope, female    DOB: 1961-08-09, 51 y.o.   MRN: [id number]  HPI  S. 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  The stress of family job etc. Increased and we started her on Paxil 10 mg daily.  She took this about 15 years ago for short period time, and it helped.  Now she states she is about 60% better and sleeping better and is content to continue that dose.  LMP was December the 12th still regular.  She has 53 year old twins 53 year old at home prior to that had two D&Cs.  She does have an occasional fungal infection in her groin for which she uses over-the-counter anti-fungal cream.  She gets routine eye care, but not to dental care.  Advised to dental care of a 6 months, BSE monthly......... History of a fibrocystic pea-sized lesion, right breast at 6 o'clock at the periphery...Marland KitchenMarland KitchenMarland Kitchen Annual mammogram.  Normal, colonoscopy, 2010, normal, tetanus, 2010, seasonal flu shot 2012, recent TB skin test nonreactive.   Review of Systems  Constitutional: Negative.   HENT: Negative.   Eyes: Negative.   Respiratory: Negative.   Cardiovascular: Negative.   Gastrointestinal: Negative.   Genitourinary: Negative.   Musculoskeletal: Negative.   Neurological: Negative.   Hematological: Negative.   Psychiatric/Behavioral: Negative.        Objective:   Physical Exam  Constitutional: She appears well-developed and well-nourished.  HENT:  Head: Normocephalic and atraumatic.  Right Ear: External ear normal.  Left Ear: External ear normal.  Nose: Nose normal.  Mouth/Throat: Oropharynx is clear and moist.  Eyes: EOM are normal. Pupils are equal, round, and reactive to light.  Neck: Normal range of motion. Neck supple. No thyromegaly present.  Cardiovascular: Normal rate, regular rhythm, normal heart sounds and intact distal pulses.   Exam reveals no gallop and no friction rub.   No murmur heard. Pulmonary/Chest: Effort normal and breath sounds normal.  Abdominal: Soft. Bowel sounds are normal. She exhibits no distension and no mass. There is no tenderness. There is no rebound.  Genitourinary: Vagina normal and uterus normal. Guaiac negative stool. No vaginal discharge found.       Bilateral breast exam normal.  There is a small, tender cystic lesion in the right breast at the 6 o'clock position at the periphery.  It soft, rubbery, movable, and has been the lesion that we have both felt for many years  Musculoskeletal: Normal range of motion.  Lymphadenopathy:    She has no cervical adenopathy.  Neurological: She is alert. She has normal reflexes. No cranial nerve deficit. She exhibits normal muscle tone. Coordination normal.  Skin: Skin is warm and dry.  Psychiatric: She has a normal mood and affect. Her behavior is normal. Judgment and thought content normal.          Assessment & Plan:  Healthy female.  Perimenopausal observed.  Slight depression.  Plan continue Paxil 10 mg daily.  Return in one year, sooner if any problem.  Fungal infection in groin.  OTC anti-fungal cream

## 2011-12-26 NOTE — Patient Instructions (Signed)
Continue the Paxil, 10 mg daily for one year.  Return in one year, sooner if any problem.  Continued to do a thorough breast exam monthly

## 2012-01-18 ENCOUNTER — Telehealth: Payer: Self-pay | Admitting: *Deleted

## 2012-01-18 NOTE — Telephone Encounter (Signed)
Patient is calling because she has stopped taking Paxil and has not had a headache for 2 weeks.  She would like to know if she should try something else or wait?

## 2012-01-19 NOTE — Telephone Encounter (Signed)
please call I don't understand the message

## 2012-01-20 NOTE — Telephone Encounter (Signed)
Patient will wait and call back as needed

## 2012-07-06 ENCOUNTER — Encounter: Payer: Self-pay | Admitting: Family

## 2012-07-06 ENCOUNTER — Ambulatory Visit (INDEPENDENT_AMBULATORY_CARE_PROVIDER_SITE_OTHER): Payer: Commercial Managed Care - PPO | Admitting: Family

## 2012-07-06 VITALS — BP 138/88 | HR 86 | Temp 98.6°F | Wt 196.0 lb

## 2012-07-06 DIAGNOSIS — F411 Generalized anxiety disorder: Secondary | ICD-10-CM

## 2012-07-06 DIAGNOSIS — F419 Anxiety disorder, unspecified: Secondary | ICD-10-CM

## 2012-07-06 DIAGNOSIS — F329 Major depressive disorder, single episode, unspecified: Secondary | ICD-10-CM

## 2012-07-06 LAB — CBC WITH DIFFERENTIAL/PLATELET
Basophils Absolute: 0 10*3/uL (ref 0.0–0.1)
Basophils Relative: 0.5 % (ref 0.0–3.0)
Eosinophils Absolute: 0.1 10*3/uL (ref 0.0–0.7)
Eosinophils Relative: 1.4 % (ref 0.0–5.0)
HCT: 37.8 % (ref 36.0–46.0)
Lymphocytes Relative: 29.6 % (ref 12.0–46.0)
MCV: 87.1 fl (ref 78.0–100.0)
Neutro Abs: 3.7 10*3/uL (ref 1.4–7.7)

## 2012-07-06 MED ORDER — ESCITALOPRAM OXALATE 10 MG PO TABS: 10.0000 mg | ORAL_TABLET | Freq: Every day | ORAL | Status: DC

## 2012-07-06 NOTE — Patient Instructions (Addendum)
Depression, Adolescent and Adult Depression is a true and treatable medical condition. In general there are two kinds of depression:  Depression we all experience in some form. For example depression from the death of a loved one, financial distress or natural disasters will trigger or increase depression.   Clinical depression, on the other hand, appears without an apparent cause or reason. This depression is a disease. Depression may be caused by chemical imbalance in the body and brain or may come as a response to a physical illness. Alcohol and other drugs can cause depression.  DIAGNOSIS  The diagnosis of depression is usually based upon symptoms and medical history. TREATMENT  Treatments for depression fall into three categories. These are:  Drug therapy. There are many medicines that treat depression. Responses may vary and sometimes trial and error is necessary to determine the best medicines and dosage for a particular patient.   Psychotherapy, also called talking treatments, helps people resolve their problems by looking at them from a different point of view and by giving people insight into their own personal makeup. Traditional psychotherapy looks at a childhood source of a problem. Other psychotherapy will look at current conflicts and move toward solving those. If the cause of depression is drug use, counseling is available to help abstain. In time the depression will usually improve. If there were underlying causes for the chemical use, they can be addressed.   ECT (electroconvulsive therapy) or shock treatment is not as commonly used today. It is a very effective treatment for severe suicidal depression. During ECT electrical impulses are applied to the head. These impulses cause a generalized seizure. It can be effective but causes a loss of memory for recent events. Sometimes this loss of memory may include the last several months.  Treat all depression or suicide threats as  serious. Obtain professional help. Do not wait to see if serious depression will get better over time without help. Seek help for yourself or those around you. In the [place] the number to the National Suicide Help Lines With 24 Hour Help Are: 1-800-SUICIDE 1-800-784-2433 Document Released: 11/25/2000 Document Revised: 11/17/2011 Document Reviewed: 07/16/2008 ExitCare Patient Information 2012 [employer].    Anxiety and Panic Attacks Your caregiver has informed you that you are having an anxiety or panic attack. There may be many forms of this. Most of the time these attacks come suddenly and without warning. They come at any time of day, including periods of sleep, and at any time of life. They may be strong and unexplained. Although panic attacks are very scary, they are physically harmless. Sometimes the cause of your anxiety is not known. Anxiety is a protective mechanism of the body in its fight or flight mechanism. Most of these perceived danger situations are actually nonphysical situations (such as anxiety over losing a job). CAUSES  The causes of an anxiety or panic attack are many. Panic attacks may occur in otherwise healthy people given a certain set of circumstances. There may be a genetic cause for panic attacks. Some medications may also have anxiety as a side effect. SYMPTOMS  Some of the most common feelings are:  Intense terror.   Dizziness, feeling faint.   Hot and cold flashes.   Fear of going crazy.   Feelings that nothing is real.   Sweating.   Shaking.   Chest pain or a fast heartbeat (palpitations).   Smothering, choking sensations.   Feelings of impending doom and that death is near.     Tingling of extremities, this may be from over-breathing.   Altered reality (derealization).   Being detached from yourself (depersonalization).  Several symptoms can be present to make up anxiety or panic attacks. DIAGNOSIS  The evaluation by your caregiver will  depend on the type of symptoms you are experiencing. The diagnosis of anxiety or panic attack is made when no physical illness can be determined to be a cause of the symptoms. TREATMENT  Treatment to prevent anxiety and panic attacks may include:  Avoidance of circumstances that cause anxiety.   Reassurance and relaxation.   Regular exercise.   Relaxation therapies, such as yoga.   Psychotherapy with a psychiatrist or therapist.   Avoidance of caffeine, alcohol and illegal drugs.   Prescribed medication.  SEEK IMMEDIATE MEDICAL CARE IF:   You experience panic attack symptoms that are different than your usual symptoms.   You have any worsening or concerning symptoms.  Document Released: 11/28/2005 Document Revised: 11/17/2011 Document Reviewed: 04/01/2010 ExitCare Patient Information 2012 [employer]. 

## 2012-07-06 NOTE — Progress Notes (Signed)
  Subjective:    Patient ID: Karen Pope, female    DOB: [birth year], 51 y.o.   MRN: [id number]  HPI 51 year old [place] American female, smoker, patient of Dr. Tawanna Cooler is in today with complaints of feeling stressed and anxious. She has also been happen more crying spells. She feels more tired and agitated, decreased concentration. She's been on Paxil 20 mg in the past and has been effective however, it also to have headaches. Patient has 3 children living at home ages 95 and 87, a mother-in-law, and a husband who is unemployed. She denies any falls of helplessness or hopelessness, no thoughts of death or dying.   Review of Systems  Constitutional: Negative.   Respiratory: Negative.   Cardiovascular: Negative.   Neurological: Negative.   Psychiatric/Behavioral: Positive for confusion and disturbed wake/sleep cycle. The patient is nervous/anxious.    No past medical history on file.  History   Social History  . Marital Status: Married    Spouse Name: N/A    Number of Children: N/A  . Years of Education: N/A   Occupational History  . Not on file.   Social History Main Topics  . Smoking status: Never Smoker   . Smokeless tobacco: Not on file  . Alcohol Use: Yes  . Drug Use: No  . Sexually Active:    Other Topics Concern  . Not on file   Social History Narrative  . No narrative on file    No past surgical history on file.  No family history on file.  No Known Allergies  Current Outpatient Prescriptions on File Prior to Visit  Medication Sig Dispense Refill  . lisinopril-hydrochlorothiazide (PRINZIDE,ZESTORETIC) 20-25 MG per tablet Take 1 tablet by mouth daily.  100 tablet  3  . escitalopram (LEXAPRO) 10 MG tablet Take 1 tablet (10 mg total) by mouth daily.  30 tablet  3  . PARoxetine (PAXIL) 20 MG tablet Take 1 tablet (20 mg total) by mouth every morning.  100 tablet  2    BP 138/88  Pulse 86  Temp 98.6 F (37 C) (Oral)  Wt 196 lb (88.905 kg)  SpO2 99%chart      Objective:   Physical Exam  Constitutional: She is oriented to person, place, and time. She appears well-developed and well-nourished.  Neck: Normal range of motion. Neck supple. No thyromegaly present.  Cardiovascular: Normal rate, regular rhythm and normal heart sounds.   Pulmonary/Chest: Effort normal and breath sounds normal.  Musculoskeletal: Normal range of motion.  Neurological: She is alert and oriented to person, place, and time.  Skin: Skin is warm and dry.  Psychiatric: She has a normal mood and affect.          Assessment & Plan:  Assessment: Depression, anxiety  Plan: Lexapro 10 mg once daily. Exercise 30 minutes daily. For bring patient back for recheck in 2-3 weeks with Dr. Tawanna Cooler or myself. Recheck sooner when necessary. And

## 2012-08-14 ENCOUNTER — Ambulatory Visit: Payer: Commercial Managed Care - PPO | Admitting: Family Medicine

## 2012-08-20 ENCOUNTER — Ambulatory Visit (INDEPENDENT_AMBULATORY_CARE_PROVIDER_SITE_OTHER): Payer: Commercial Managed Care - PPO | Admitting: Family Medicine

## 2012-08-20 ENCOUNTER — Encounter: Payer: Self-pay | Admitting: Family Medicine

## 2012-08-20 VITALS — BP 130/80 | Temp 98.5°F | Wt 196.0 lb

## 2012-08-20 DIAGNOSIS — F3289 Other specified depressive episodes: Secondary | ICD-10-CM

## 2012-08-20 DIAGNOSIS — F329 Major depressive disorder, single episode, unspecified: Secondary | ICD-10-CM

## 2012-08-20 MED ORDER — ESCITALOPRAM OXALATE 10 MG PO TABS: ORAL_TABLET | ORAL | Status: DC

## 2012-08-20 NOTE — Progress Notes (Signed)
  Subjective:    Patient ID: Karen Pope, female    DOB: 1961-08-13, 51 y.o.   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 Her mother-in-law now lives with them her husband is having chronic back pain. He's also extremely obese. Recent neurologic evaluation so spinal stenosis and he's not able to work. They recommended diet exercise and weight loss but is not able to exercise because of spinal stenosis  She feels much better on the 10 mg of Lexapro. She says her moods better sleeps better focuses better   Review of Systems General and psychiatric review of systems otherwise negative    Objective:   Physical Exam Well-developed well-nourished female no acute distress and conversation she seems to be normal she's oriented and alert and has denies any suicidal ideation       Assessment & Plan:  Mild depression continue Lexapro 10 mg each bedtime return in January for annual exam

## 2012-08-20 NOTE — Patient Instructions (Signed)
Return in January for your annual exam sooner if any problems

## 2012-09-26 ENCOUNTER — Other Ambulatory Visit: Payer: Self-pay | Admitting: *Deleted

## 2012-09-26 DIAGNOSIS — I10 Essential (primary) hypertension: Secondary | ICD-10-CM

## 2012-09-26 MED ORDER — LISINOPRIL-HYDROCHLOROTHIAZIDE 20-25 MG PO TABS: 1.0000 | ORAL_TABLET | Freq: Every day | ORAL | Status: DC

## 2012-10-10 ENCOUNTER — Encounter: Payer: Self-pay | Admitting: Family Medicine

## 2012-10-10 ENCOUNTER — Ambulatory Visit (INDEPENDENT_AMBULATORY_CARE_PROVIDER_SITE_OTHER): Payer: Commercial Managed Care - PPO | Admitting: Family Medicine

## 2012-10-10 VITALS — BP 140/90 | Temp 98.4°F | Wt 198.0 lb

## 2012-10-10 DIAGNOSIS — L509 Urticaria, unspecified: Secondary | ICD-10-CM

## 2012-10-10 DIAGNOSIS — I10 Essential (primary) hypertension: Secondary | ICD-10-CM

## 2012-10-10 MED ORDER — PREDNISONE 20 MG PO TABS: ORAL_TABLET | ORAL | Status: DC

## 2012-10-10 MED ORDER — ATENOLOL-CHLORTHALIDONE 50-25 MG PO TABS: ORAL_TABLET | ORAL | Status: DC

## 2012-10-10 NOTE — Patient Instructions (Signed)
Prednisone 20 mg........ one half tab daily till Monday then starting next Monday one half tab Monday Wednesday Friday for a 3 week taper  Keep the EpiPen with you  Stop the Zestoretic  Tenoretic dose one half tab every morning  BP check every morning  Call  if blood pressure not at goal

## 2012-10-10 NOTE — Progress Notes (Signed)
  Subjective:    Patient ID: Karen Pope, female    DOB: August 13, 1961, 51 y.o.   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 and about an hour later she developed some swelling of her upper lip then she developed swelling of her lower lip and some tightness in her chest. She was at work and was given 80 mg of Depakote Medrol. She was also given albuterol with hand-held nebulizer 50 mg of Benadryl and an IV was started. Her symptoms persisted and then she was transported by EMS to the emergency room. In the emergency room she was given Solu-Medrol IV and Medrol dosepak and comes in today for followup.  The trail mix  seen to be generic. She has been on lisinopril for a long time for hypertension.  Today she feels well except she still has some tingling sensation in her lips.   Review of Systems Gen. review of systems otherwise negative    Objective:   Physical Exam Well-developed well-nourished female in no acute distress       Assessment & Plan:  Acute urticaria with wheezing question etiology ACE inhibitor versus food reaction plan Taper prednisone slowly  Switch to Tenoretic from lisinopril  BP check daily followup in one month  EpiPen with her at all times in the future

## 2012-12-03 ENCOUNTER — Telehealth: Payer: Self-pay | Admitting: Family Medicine

## 2012-12-03 NOTE — Telephone Encounter (Signed)
[facility] Triage Call Report Triage Record Num: [id number] Operator: Peri Jefferson Patient Name: Karen Pope Call Date & Time: 12/01/2012 11:34:13AM Patient Phone: [phone] PCP: Eugenio Hoes. Todd Patient Gender: Female PCP Fax : [phone] Patient DOB: [birth year] [facility] Name[facility]  Reason for Call: Caller: Tahisha/Patient; PCP: Kelle [facility]); CB#: [phone]; Call regarding Cough/Congestion; Kenetra developed a cough, nasal congestion and sore throat on 11/03/12. Afebrile. Cough is productive with green/brown phlegm. Denies difficulty breathing/wheezing. Utilized Cough Guideline. See PCP within 24 hrs disposition due to "productive cough with colored sputum". No appts at [facility] office. Advised UC. Parameters reviewed concerning when to call back. Protocol(s) Used: Cough - Adult Recommended Outcome per Protocol: See Provider within 24 hours Reason for Outcome: Productive cough with colored sputum (other than clear or white sputum)

## 2012-12-15 ENCOUNTER — Encounter: Payer: Self-pay | Admitting: Internal Medicine

## 2012-12-15 ENCOUNTER — Ambulatory Visit (INDEPENDENT_AMBULATORY_CARE_PROVIDER_SITE_OTHER): Payer: 59 | Admitting: Internal Medicine

## 2012-12-15 VITALS — BP 110/74 | HR 76 | Temp 99.5°F | Resp 20 | Wt 200.0 lb

## 2012-12-15 DIAGNOSIS — R05 Cough: Secondary | ICD-10-CM

## 2012-12-15 MED ORDER — DOXYCYCLINE HYCLATE 100 MG PO TABS: 100.0000 mg | ORAL_TABLET | Freq: Two times a day (BID) | ORAL | Status: DC

## 2012-12-15 NOTE — Progress Notes (Signed)
  Subjective:    Patient ID: Karen Pope, female    DOB: [birth year], 52 y.o.   MRN: [id number]  HPI  52 year old female complains of intermittent cough and congestion since second week of November. She was seen at urgent care 2 weeks ago and treated with amoxicillin 875 mg twice daily and Depo-Medrol. Patient reports despite antibiotics, she still has productive cough. Sputum is yellowish to green. She denies fever or chills or shortness of breath. She is nonsmoker.  Review of Systems Negative for shortness of breath  No past medical history on file.  History   Social History  . Marital Status: Married    Spouse Name: N/A    Number of Children: N/A  . Years of Education: N/A   Occupational History  . Not on file.   Social History Main Topics  . Smoking status: Never Smoker   . Smokeless tobacco: Not on file  . Alcohol Use: Yes  . Drug Use: No  . Sexually Active:    Other Topics Concern  . Not on file   Social History Narrative  . No narrative on file    No past surgical history on file.  No family history on file.  Allergies  Allergen Reactions  . Lisinopril     Current Outpatient Prescriptions on File Prior to Visit  Medication Sig Dispense Refill  . albuterol (PROVENTIL HFA;VENTOLIN HFA) 108 (90 BASE) MCG/ACT inhaler Inhale 2 puffs into the lungs every 6 (six) hours as needed.      Marland Kitchen atenolol-chlorthalidone (TENORETIC 50) 50-25 MG per tablet One half tab every morning  100 tablet  3  . escitalopram (LEXAPRO) 10 MG tablet 1 by mouth each bedtime  100 tablet  3  . predniSONE (DELTASONE) 20 MG tablet One half tablet x5 days then one half tablet Monday Wednesday Friday for a 3 week taper  30 tablet  1    BP 110/74  Pulse 76  Temp 99.5 F (37.5 C) (Oral)  Resp 20  Wt 200 lb (90.719 kg)  LMP 12/09/2012       Objective:   Physical Exam  Constitutional: She appears well-developed and well-nourished.  HENT:  Head: Normocephalic and atraumatic.  Right  Ear: External ear normal.  Mouth/Throat: No oropharyngeal exudate.       Oropharyngeal erythema  Neck:       No neck tenderness  Cardiovascular: Normal rate, regular rhythm and normal heart sounds.   Pulmonary/Chest: Effort normal and breath sounds normal. She has no wheezes.  Skin: Skin is warm and dry.  Psychiatric: She has a normal mood and affect. Her behavior is normal.          Assessment & Plan:

## 2012-12-15 NOTE — Patient Instructions (Addendum)
Gargle with warm salt water twice daily Use nasal saline as directed 3-4 times a day Use Mucinex twice daily over-the-counter Please call our office if your symptoms do not improve or gets worse.

## 2012-12-15 NOTE — Assessment & Plan Note (Signed)
52 year old female with persistent cough since second week in November. Patient treated 2 weeks ago at urgent care with steroids and Augmentin. Despite antibiotics and steroids her cough has not improved. I suspect symptoms triggered by postnasal drip. She may have chronic sinusitis. I suggest treatment with doxycycline 100 mg twice daily for 10 days. If persistent symptoms consider, chest x-ray.  Follow up with PCP within 2 weeks.

## 2012-12-17 ENCOUNTER — Other Ambulatory Visit: Payer: Self-pay | Admitting: Family Medicine

## 2012-12-17 DIAGNOSIS — R05 Cough: Secondary | ICD-10-CM

## 2012-12-20 ENCOUNTER — Other Ambulatory Visit: Payer: Commercial Managed Care - PPO

## 2012-12-26 ENCOUNTER — Other Ambulatory Visit (HOSPITAL_COMMUNITY)
Admission: RE | Admit: 2012-12-26 | Discharge: 2012-12-26 | Disposition: A | Discharge status: Home / Self Care | Payer: 59 | Source: Ambulatory Visit | Attending: Family Medicine | Admitting: Family Medicine

## 2012-12-26 ENCOUNTER — Ambulatory Visit (INDEPENDENT_AMBULATORY_CARE_PROVIDER_SITE_OTHER): Payer: 59 | Admitting: Family Medicine

## 2012-12-26 ENCOUNTER — Encounter: Payer: Self-pay | Admitting: Family Medicine

## 2012-12-26 VITALS — BP 102/70 | Temp 98.7°F | Ht 66.25 in | Wt 198.0 lb

## 2012-12-26 DIAGNOSIS — F329 Major depressive disorder, single episode, unspecified: Secondary | ICD-10-CM

## 2012-12-26 DIAGNOSIS — Z01419 Encounter for gynecological examination (general) (routine) without abnormal findings: Secondary | ICD-10-CM | POA: Insufficient documentation

## 2012-12-26 DIAGNOSIS — I10 Essential (primary) hypertension: Secondary | ICD-10-CM

## 2012-12-26 DIAGNOSIS — N852 Hypertrophy of uterus: Secondary | ICD-10-CM

## 2012-12-26 DIAGNOSIS — F32A Depression, unspecified: Secondary | ICD-10-CM

## 2012-12-26 DIAGNOSIS — L509 Urticaria, unspecified: Secondary | ICD-10-CM

## 2012-12-26 LAB — BASIC METABOLIC PANEL
BUN: 17 mg/dL (ref 6–23)
CO2: 30 mEq/L (ref 19–32)
Calcium: 9.3 mg/dL (ref 8.4–10.5)
GFR: 76.17 mL/min (ref >60.00)
Glucose, Bld: 91 mg/dL (ref 70–99)
Potassium: 4.1 mEq/L (ref 3.5–5.1)

## 2012-12-26 LAB — CBC WITH DIFFERENTIAL/PLATELET
Basophils Absolute: 0 10*3/uL (ref 0.0–0.1)
Eosinophils Absolute: 0.1 10*3/uL (ref 0.0–0.7)
HCT: 37.3 % (ref 36.0–46.0)
Hemoglobin: 12.3 g/dL (ref 12.0–15.0)
Lymphs Abs: 2.3 10*3/uL (ref 0.7–4.0)
MCHC: 32.8 g/dL (ref 30.0–36.0)
MCV: 84 fl (ref 78.0–100.0)
Neutro Abs: 3.9 10*3/uL (ref 1.4–7.7)
RDW: 15.3 % — ABNORMAL HIGH (ref 11.5–14.6)

## 2012-12-26 LAB — HEPATIC FUNCTION PANEL
Alkaline Phosphatase: 52 U/L (ref 39–117)
Bilirubin, Direct: 0.1 mg/dL (ref 0.0–0.3)

## 2012-12-26 LAB — LIPID PANEL
Cholesterol: 182 mg/dL (ref 0–200)
LDL Cholesterol: 88 mg/dL (ref 0–99)
Total CHOL/HDL Ratio: 2

## 2012-12-26 LAB — POCT URINALYSIS DIPSTICK
Blood, UA: NEGATIVE
Nitrite, UA: NEGATIVE
Urobilinogen, UA: 0.2
pH, UA: 7

## 2012-12-26 MED ORDER — ESCITALOPRAM OXALATE 10 MG PO TABS: ORAL_TABLET | ORAL | Status: DC

## 2012-12-26 MED ORDER — ATENOLOL-CHLORTHALIDONE 50-25 MG PO TABS: ORAL_TABLET | ORAL | Status: DC

## 2012-12-26 NOTE — Patient Instructions (Addendum)
Continue your current medications  Consider a diet and exercise program as we discussed  If you wish to take a short course of BCPs let me know  Return in one year for general physical examination sooner if any problems

## 2012-12-26 NOTE — Progress Notes (Signed)
  Subjective:    Patient ID: Karen Pope, female    DOB: Feb 26, 1961, 52 y.o.   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 She states she's not lightheaded when she stands up.  She also takes Lexapro 10 mg each bedtime for mild depression.  She has changed jobs and is now doing the home health care visits and likes is much better. She's thinking about her diet and exercise program  She gets routine eye care, not dental care, BSE monthly, annual  mammography,, colonoscopy normal, tetanus 2010, seasonal flu shot 2013.  Her LMP was 1229-15 it's very heavy. It's now every 4-6 weeks. We discussed various options including endometrial ablation,,,,,,,,,,, she's had her tubes tied,,,,,,,,,,, birth control pills or watchful waiting.  Her husband is home he is disabled now because of spinal stenosis    Review of Systems  Constitutional: Negative.   HENT: Negative.   Eyes: Negative.   Respiratory: Negative.   Cardiovascular: Negative.   Gastrointestinal: Negative.   Genitourinary: Negative.   Musculoskeletal: Negative.   Neurological: Negative.   Hematological: Negative.   Psychiatric/Behavioral: Negative.        Objective:   Physical Exam  Constitutional: She appears well-developed and well-nourished.  HENT:  Head: Normocephalic and atraumatic.  Right Ear: External ear normal.  Left Ear: External ear normal.  Nose: Nose normal.  Mouth/Throat: Oropharynx is clear and moist.  Eyes: EOM are normal. Pupils are equal, round, and reactive to light.  Neck: Normal range of motion. Neck supple. No thyromegaly present.  Cardiovascular: Normal rate, regular rhythm, normal heart sounds and intact distal  pulses.  Exam reveals no gallop and no friction rub.   No murmur heard. Pulmonary/Chest: Effort normal and breath sounds normal.  Abdominal: Soft. Bowel sounds are normal. She exhibits no distension and no mass. There is no tenderness. There is no rebound.  Genitourinary: Vagina normal and uterus normal. Guaiac negative stool. No vaginal discharge found.       Bilateral breast exam normal except for a pea-sized cystic lesion right breast 12:00 position at the periphery. It's soft rubbery movable and tender. This has been previously present it's not a new finding  Musculoskeletal: Normal range of motion.  Lymphadenopathy:    She has no cervical adenopathy.  Neurological: She is alert. She has normal reflexes. No cranial nerve deficit. She exhibits normal muscle tone. Coordination normal.  Skin: Skin is warm and dry.  Psychiatric: She has a normal mood and affect. Her behavior is normal. Judgment and thought content normal.          Assessment & Plan:  Healthy female  Hypertension continue current medication  Depression continue current medication  Obesity again discussed diet exercise program  Heavy periods,,,,,,,,,,,, she'll X. no therapy at this time

## 2012-12-28 ENCOUNTER — Encounter: Payer: Self-pay | Admitting: *Deleted

## 2013-02-05 ENCOUNTER — Telehealth: Payer: Self-pay | Admitting: Family Medicine

## 2013-02-05 NOTE — Telephone Encounter (Signed)
Spoke with patient and she will come in tomorrow at 815.

## 2013-02-05 NOTE — Telephone Encounter (Signed)
For your review

## 2013-02-05 NOTE — Telephone Encounter (Signed)
Patient Information:  Caller Name: Mikaela  Phone: [phone]  Patient: Karen Pope, Karen Pope  Gender: Female  DOB: [birth year]  Age: 52 Years  PCP: Kelle [facility])  Pregnant: No  Office Follow Up:  Does the office need to follow up with this patient?: No  Instructions For The Office: N/A  RN Note:  Used Breast Symptom protocol. Advised appointment 2-26 due to tender area on breast.  Symptoms  Reason For Call & Symptoms: Has new area on right breast that is tender for "several weeks".  Can feel knot size of "okra seed". Is similar to spot MD felt at last physical.  Reviewed Health History In EMR: Yes  Reviewed Medications In EMR: Yes  Reviewed Allergies In EMR: Yes  Reviewed Surgeries / Procedures: Yes  Date of Onset of Symptoms: 01/15/2013 OB / GYN:  LMP: 01/18/2013  Guideline(s) Used:  No Protocol Available - Sick Adult  Disposition Per Guideline:   See Today or Tomorrow in Office  Reason For Disposition Reached:   Nursing judgment  Advice Given:  N/A  Appointment Scheduled:  02/06/2013 13:15:00 Appointment Scheduled Provider:  Artist Pais, Doe-Hyun Molly Maduro) (Adults only)

## 2013-02-06 ENCOUNTER — Encounter: Payer: Self-pay | Admitting: Family Medicine

## 2013-02-06 ENCOUNTER — Ambulatory Visit: Payer: Self-pay | Admitting: Internal Medicine

## 2013-02-06 ENCOUNTER — Ambulatory Visit (INDEPENDENT_AMBULATORY_CARE_PROVIDER_SITE_OTHER): Payer: 59 | Admitting: Family Medicine

## 2013-02-06 ENCOUNTER — Other Ambulatory Visit: Payer: Self-pay | Admitting: Family Medicine

## 2013-02-06 VITALS — BP 130/90 | Temp 98.8°F | Wt 201.0 lb

## 2013-02-06 DIAGNOSIS — N63 Unspecified lump in unspecified breast: Secondary | ICD-10-CM

## 2013-02-06 DIAGNOSIS — N6311 Unspecified lump in the right breast, upper outer quadrant: Secondary | ICD-10-CM

## 2013-02-06 NOTE — Progress Notes (Signed)
  Subjective:    Patient ID: Karen Pope, female    DOB: 06-10-1961, 52 y.o.   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 This is been present for many years. About a month ago she noticed a Above that. It's rubbery tender and movable. She does not think it's increased in size   Review of Systems Review of systems otherwise negative recent mammogram at [facility] normal    Objective:   Physical Exam Well-developed well-nourished female no acute distress examination of the right breast appears normal there is no change in the skin no dimpling. There is a palpable lesion right breast at the 9:00 position is previously been there and a new pea-sized lesion about an inch above that at the 10:00 position at the rim of the breast tissue. It soft rubbery movable and tender       Assessment & Plan:  Newly lesion right breast ultrasound for evaluation

## 2013-02-06 NOTE — Patient Instructions (Addendum)
We will set up an ultrasound of that lesion ASAP

## 2013-02-15 ENCOUNTER — Other Ambulatory Visit: Payer: 59

## 2014-02-26 ENCOUNTER — Ambulatory Visit (INDEPENDENT_AMBULATORY_CARE_PROVIDER_SITE_OTHER): Payer: 59 | Admitting: Family Medicine

## 2014-02-26 ENCOUNTER — Other Ambulatory Visit: Payer: Self-pay | Admitting: Family Medicine

## 2014-02-26 ENCOUNTER — Encounter: Payer: Self-pay | Admitting: Family Medicine

## 2014-02-26 VITALS — BP 120/80 | Temp 98.8°F | Ht 66.0 in | Wt 205.0 lb

## 2014-02-26 DIAGNOSIS — N925 Other specified irregular menstruation: Secondary | ICD-10-CM

## 2014-02-26 DIAGNOSIS — E669 Obesity, unspecified: Secondary | ICD-10-CM

## 2014-02-26 DIAGNOSIS — I10 Essential (primary) hypertension: Secondary | ICD-10-CM

## 2014-02-26 DIAGNOSIS — L509 Urticaria, unspecified: Secondary | ICD-10-CM

## 2014-02-26 DIAGNOSIS — R32 Unspecified urinary incontinence: Secondary | ICD-10-CM

## 2014-02-26 DIAGNOSIS — N63 Unspecified lump in unspecified breast: Secondary | ICD-10-CM

## 2014-02-26 DIAGNOSIS — R4789 Other speech disturbances: Secondary | ICD-10-CM

## 2014-02-26 DIAGNOSIS — N949 Unspecified condition associated with female genital organs and menstrual cycle: Secondary | ICD-10-CM

## 2014-02-26 DIAGNOSIS — N938 Other specified abnormal uterine and vaginal bleeding: Secondary | ICD-10-CM

## 2014-02-26 DIAGNOSIS — R4702 Dysphasia: Secondary | ICD-10-CM

## 2014-02-26 DIAGNOSIS — N6311 Unspecified lump in the right breast, upper outer quadrant: Secondary | ICD-10-CM

## 2014-02-26 LAB — CBC WITH DIFFERENTIAL/PLATELET
BASOS ABS: 0 10*3/uL (ref 0.0–0.1)
Basophils Relative: 0.4 % (ref 0.0–3.0)
Eosinophils Absolute: 0.2 10*3/uL (ref 0.0–0.7)
Eosinophils Relative: 2.6 % (ref 0.0–5.0)
HCT: 38.9 % (ref 36.0–46.0)
HEMOGLOBIN: 12.5 g/dL (ref 12.0–15.0)
LYMPHS PCT: 35.5 % (ref 12.0–46.0)
Lymphs Abs: 2.2 10*3/uL (ref 0.7–4.0)
MCHC: 32 g/dL (ref 30.0–36.0)
MCV: 83.7 fl (ref 78.0–100.0)
MONOS PCT: 9.6 % (ref 3.0–12.0)
Monocytes Absolute: 0.6 10*3/uL (ref 0.1–1.0)
NEUTROS ABS: 3.2 10*3/uL (ref 1.4–7.7)
Neutrophils Relative %: 51.9 % (ref 43.0–77.0)
Platelets: 256 10*3/uL (ref 150.0–400.0)
RBC: 4.65 Mil/uL (ref 3.87–5.11)
RDW: 15.4 % — AB (ref 11.5–14.6)
WBC: 6.2 10*3/uL (ref 4.5–10.5)

## 2014-02-26 LAB — HEPATIC FUNCTION PANEL
ALBUMIN: 4 g/dL (ref 3.5–5.2)
ALT: 22 U/L (ref 0–35)
AST: 22 U/L (ref 0–37)
Alkaline Phosphatase: 59 U/L (ref 39–117)
Bilirubin, Direct: 0 mg/dL (ref 0.0–0.3)
TOTAL PROTEIN: 7.5 g/dL (ref 6.0–8.3)
Total Bilirubin: 0.6 mg/dL (ref 0.3–1.2)

## 2014-02-26 LAB — LIPID PANEL
CHOLESTEROL: 187 mg/dL (ref 0–200)
HDL: 84.6 mg/dL (ref >39.00)
LDL Cholesterol: 96 mg/dL (ref 0–99)
Total CHOL/HDL Ratio: 2
Triglycerides: 34 mg/dL (ref 0.0–149.0)
VLDL: 6.8 mg/dL (ref 0.0–40.0)

## 2014-02-26 LAB — BASIC METABOLIC PANEL
BUN: 17 mg/dL (ref 6–23)
CO2: 29 mEq/L (ref 19–32)
Calcium: 9.6 mg/dL (ref 8.4–10.5)
Chloride: 105 mEq/L (ref 96–112)
Creatinine, Ser: 1.1 mg/dL (ref 0.4–1.2)
GFR: 68.52 mL/min (ref >60.00)
GLUCOSE: 94 mg/dL (ref 70–99)
POTASSIUM: 4.3 meq/L (ref 3.5–5.1)
Sodium: 142 mEq/L (ref 135–145)

## 2014-02-26 LAB — POCT URINALYSIS DIPSTICK
BILIRUBIN UA: NEGATIVE
GLUCOSE UA: NEGATIVE
Ketones, UA: NEGATIVE
NITRITE UA: NEGATIVE
Protein, UA: NEGATIVE
Spec Grav, UA: 1.02
Urobilinogen, UA: 0.2
pH, UA: 6

## 2014-02-26 LAB — TSH: TSH: 1.01 u[IU]/mL (ref 0.35–5.50)

## 2014-02-26 MED ORDER — AMLODIPINE BESYLATE 5 MG PO TABS: 5.0000 mg | ORAL_TABLET | Freq: Every day | ORAL | Status: DC

## 2014-02-26 MED ORDER — EPINEPHRINE 0.3 MG/0.3ML IJ SOAJ: 0.3000 mg | Freq: Once | INTRAMUSCULAR | Status: DC

## 2014-02-26 MED ORDER — HYDROCHLOROTHIAZIDE 12.5 MG PO TABS: 12.5000 mg | ORAL_TABLET | Freq: Every day | ORAL | Status: DC

## 2014-02-26 NOTE — Patient Instructions (Signed)
Stop the Tenoretic  Norvasc 5 mg........ one tablet daily in the morning  Hydrochlorothiazide 12.5 mg.....Marland Kitchen one tablet daily in the morning when necessary  Check your blood pressure daily in the morning for 4 weeks. If at the end of 4 weeks your blood pressure is normal in continue the above. If your blood pressure is not normal and call me leave a voicemail with Apolonio Schneiders extension [phone]  Per our discussion let's start a walking program daily 30 minutes  Will call you about your lab work  Call Dr. Alinda Money for urology consult  Also call Dr. Leo Grosser  for a GYN consult

## 2014-02-26 NOTE — Progress Notes (Signed)
Pre visit review using our clinic review tool, if applicable. No additional management support is needed unless otherwise documented below in the visit note. 

## 2014-02-26 NOTE — Progress Notes (Signed)
   Subjective:    Patient ID: Karen Pope, female    DOB: 06/26/1961, 53 y.o.   MRN: [id number]  HPI Karen Pope is a 53 year old married female nonsmoker who comes in today for general physical examination  She's albuterol once or twice year when she wheezes from a bad cold  She's taken Tenoretic 50-25 one half tab daily for hypertension but she's having side effects to medication. 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 Sometimes she can't make it to the bathroom.  She's also having difficulty swallowing and feels food gets stuck in her esophagus.  She's also been dysfunction uterine bleeding. Her periods are regular about every 6 weeks but they're extremely heavy.  She gets routine eye care not dental care, BSE monthly, and you mammography, recent colonoscopy in the [facility].  She does not exercise on a regular basis she is a Designer, jewellery by trade    Review of Systems  Constitutional: Negative.   HENT: Negative.   Eyes: Negative.   Respiratory: Negative.   Cardiovascular: Negative.   Gastrointestinal: Negative.   Genitourinary: Negative.   Musculoskeletal: Negative.   Neurological: Negative.   Psychiatric/Behavioral: Negative.        Objective:   Physical Exam  Nursing note and vitals reviewed. Constitutional: She appears well-developed and well-nourished.  HENT:  Head: Normocephalic and atraumatic.  Right Ear: External ear normal.  Left Ear: External ear normal.  Nose: Nose normal.  Mouth/Throat: Oropharynx is clear and moist.  Eyes: EOM are normal. Pupils are equal, round, and reactive to light.  Neck: Normal range of motion. Neck supple. No thyromegaly present.  Cardiovascular: Normal rate, regular rhythm,  normal heart sounds and intact distal pulses.  Exam reveals no gallop and no friction rub.   No murmur heard. No carotid or aortic bruits peripheral pulses were 2+ and symmetrical  Pulmonary/Chest: Effort normal and breath sounds normal.  Abdominal: Soft. Bowel sounds are normal. She exhibits no distension and no mass. There is no tenderness. There is no rebound.  Genitourinary:  Bilateral breast exam normal except for a fibrocystic lesion 11:00 right breast at the periphery. It's been present for many years. It soft rubbery move a will.  Pelvic and Pap deferred to GYN because she's going there for evaluation of dysfunction uterine bleeding  Musculoskeletal: Normal range of motion.  Lymphadenopathy:    She has no cervical adenopathy.  Neurological: She is alert. She has normal reflexes. No cranial nerve deficit. She exhibits normal muscle tone. Coordination normal.  Skin: Skin is warm and dry.  Psychiatric: She has a normal mood and affect. Her behavior is normal. Judgment and thought content normal.          Assessment & Plan:  Healthy female  Hypertension switch to Norvasc  Occasional asthma albuterol when necessary  History of urticaria etiology unknown refill EpiPen  Urinary leakage discussed diet exercise weight loss and urologic consult with Dr. Alinda Money  Symptoms of esophageal stricture refer to GI  Persistent heavy periods at age 17 GYN consult  Overweight again discussed diet exercise and weight loss  Cystic lesion right breast continue BSE monthly and annual mammography.

## 2014-02-27 ENCOUNTER — Telehealth: Payer: Self-pay | Admitting: Family Medicine

## 2014-02-27 NOTE — Telephone Encounter (Signed)
Relevant patient education mailed to patient.  

## 2014-02-28 ENCOUNTER — Encounter: Payer: Self-pay | Admitting: Gastroenterology

## 2014-04-29 ENCOUNTER — Ambulatory Visit: Payer: 59 | Admitting: Gastroenterology

## 2014-07-30 ENCOUNTER — Other Ambulatory Visit: Payer: Self-pay | Admitting: Family Medicine

## 2014-07-30 ENCOUNTER — Telehealth: Payer: Self-pay | Admitting: Family Medicine

## 2014-07-30 MED ORDER — ESCITALOPRAM OXALATE 10 MG PO TABS: 10.0000 mg | ORAL_TABLET | Freq: Every day | ORAL | Status: DC

## 2014-07-30 NOTE — Telephone Encounter (Signed)
Pt request refill escitalopram (LEXAPRO) 10 MG tablet pt not taken this since 2013. Pt states she has had a lot of fatigue and thought this would help. [facility]/[address] Pt made appt for 9/15 at 9:15.

## 2014-07-30 NOTE — Telephone Encounter (Signed)
Rx sent 

## 2014-08-26 ENCOUNTER — Ambulatory Visit (INDEPENDENT_AMBULATORY_CARE_PROVIDER_SITE_OTHER): Payer: 59 | Admitting: Family Medicine

## 2014-08-26 ENCOUNTER — Encounter: Payer: Self-pay | Admitting: Family Medicine

## 2014-08-26 VITALS — BP 140/90 | Temp 98.4°F | Wt 199.0 lb

## 2014-08-26 DIAGNOSIS — E669 Obesity, unspecified: Secondary | ICD-10-CM

## 2014-08-26 DIAGNOSIS — N938 Other specified abnormal uterine and vaginal bleeding: Secondary | ICD-10-CM

## 2014-08-26 DIAGNOSIS — R5381 Other malaise: Secondary | ICD-10-CM | POA: Insufficient documentation

## 2014-08-26 DIAGNOSIS — I10 Essential (primary) hypertension: Secondary | ICD-10-CM

## 2014-08-26 DIAGNOSIS — N949 Unspecified condition associated with female genital organs and menstrual cycle: Secondary | ICD-10-CM

## 2014-08-26 DIAGNOSIS — R5383 Other fatigue: Secondary | ICD-10-CM

## 2014-08-26 LAB — BASIC METABOLIC PANEL
BUN: 15 mg/dL (ref 6–23)
CALCIUM: 9.7 mg/dL (ref 8.4–10.5)
CO2: 27 mEq/L (ref 19–32)
Chloride: 103 mEq/L (ref 96–112)
Creatinine, Ser: 1 mg/dL (ref 0.4–1.2)
GFR: 74.81 mL/min (ref >60.00)
GLUCOSE: 85 mg/dL (ref 70–99)
Potassium: 3.6 mEq/L (ref 3.5–5.1)
SODIUM: 140 meq/L (ref 135–145)

## 2014-08-26 LAB — HEMOGLOBIN A1C: HEMOGLOBIN A1C: 5.8 % (ref 4.6–6.5)

## 2014-08-26 NOTE — Patient Instructions (Signed)
Walk 30 minutes daily  Labs today  BP checked daily in the morning,,,,,,,,,,, fax me the data at [phone] in 3 weeks  Hydrochlorothiazide 12.5 mg,,,,,,,,, 1 daily in the morning  Hold the Norvasc for now

## 2014-08-26 NOTE — Progress Notes (Signed)
   Subjective:    Patient ID: Karen Pope, female    DOB: July 14, 1961, 53 y.o.   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 She takes a diuretic for 4 times per week. BP today 140/90  Her major concern is for the past 6 months she felt tired fatigue and no energy. She's taking care of her mother-in-law. She's also going through menopause. She continues at age 18 to have periods every 6 weeks however they're extremely heavy. She's working with her GYN to find a solution. She states her work is okay home is okay children are okay. She denies any history of depression. She did type try the Lexapro 10 mg for the cause vertigo therefore she stopped it.   Review of Systems Review of systems otherwise negative    Objective:   Physical Exam  Well-developed well-nourished female no acute distress vital signs stable she is afebrile and he negative thyroid normal      Assessment & Plan:  Fatigue,,,,,,,,,,,,,,, probably multifactorial including age home stress with mother-in-law and menopause,,,,,,,,, check labs  Hypertension,,,,,,,,,, Hydrocort thiazide daily BP check daily fact stayed in 3 weeks,

## 2014-08-26 NOTE — Progress Notes (Signed)
Pre visit review using our clinic review tool, if applicable. No additional management support is needed unless otherwise documented below in the visit note. 

## 2014-08-27 LAB — CBC WITH DIFFERENTIAL/PLATELET
Basophils Absolute: 0 10*3/uL (ref 0.0–0.1)
Basophils Relative: 0.5 % (ref 0.0–3.0)
EOS ABS: 0.1 10*3/uL (ref 0.0–0.7)
Eosinophils Relative: 2.4 % (ref 0.0–5.0)
HCT: 40.9 % (ref 36.0–46.0)
Hemoglobin: 12.9 g/dL (ref 12.0–15.0)
Lymphocytes Relative: 32.1 % (ref 12.0–46.0)
Lymphs Abs: 1.8 10*3/uL (ref 0.7–4.0)
MCHC: 31.6 g/dL (ref 30.0–36.0)
MCV: 83.9 fl (ref 78.0–100.0)
MONO ABS: 0.4 10*3/uL (ref 0.1–1.0)
Monocytes Relative: 7.8 % (ref 3.0–12.0)
NEUTROS PCT: 57.2 % (ref 43.0–77.0)
Neutro Abs: 3.2 10*3/uL (ref 1.4–7.7)
Platelets: 240 10*3/uL (ref 150.0–400.0)
RBC: 4.87 Mil/uL (ref 3.87–5.11)
RDW: 15.7 % — ABNORMAL HIGH (ref 11.5–15.5)
WBC: 5.6 10*3/uL (ref 4.0–10.5)

## 2014-11-26 ENCOUNTER — Other Ambulatory Visit: Payer: Self-pay

## 2014-11-28 ENCOUNTER — Telehealth: Payer: Self-pay

## 2014-11-28 DIAGNOSIS — I10 Essential (primary) hypertension: Secondary | ICD-10-CM

## 2014-11-28 MED ORDER — HYDROCHLOROTHIAZIDE 12.5 MG PO TABS
12.5000 mg | ORAL_TABLET | Freq: Every day | ORAL | Status: DC
Start: 2014-11-28 — End: 2015-04-01

## 2014-11-28 NOTE — Telephone Encounter (Signed)
rx sent

## 2014-11-28 NOTE — Telephone Encounter (Signed)
Rx request for HCTZ  Pharm:  [facility]  Pls advise.

## 2015-03-02 ENCOUNTER — Encounter: Payer: 59 | Admitting: Family Medicine

## 2015-03-27 LAB — HM MAMMOGRAPHY: HM Mammogram: NORMAL

## 2015-03-30 ENCOUNTER — Encounter: Payer: Self-pay | Admitting: Family Medicine

## 2015-04-01 ENCOUNTER — Ambulatory Visit (INDEPENDENT_AMBULATORY_CARE_PROVIDER_SITE_OTHER)
Admission: RE | Admit: 2015-04-01 | Discharge: 2015-04-01 | Disposition: A | Discharge status: Home / Self Care | Payer: 59 | Source: Ambulatory Visit | Attending: Family Medicine | Admitting: Family Medicine

## 2015-04-01 ENCOUNTER — Ambulatory Visit (INDEPENDENT_AMBULATORY_CARE_PROVIDER_SITE_OTHER): Payer: 59 | Admitting: Family Medicine

## 2015-04-01 ENCOUNTER — Encounter: Payer: Self-pay | Admitting: Family Medicine

## 2015-04-01 VITALS — Temp 98.1°F | Ht 65.75 in | Wt 205.7 lb

## 2015-04-01 DIAGNOSIS — N938 Other specified abnormal uterine and vaginal bleeding: Secondary | ICD-10-CM

## 2015-04-01 DIAGNOSIS — M79661 Pain in right lower leg: Secondary | ICD-10-CM | POA: Diagnosis not present

## 2015-04-01 DIAGNOSIS — E669 Obesity, unspecified: Secondary | ICD-10-CM | POA: Diagnosis not present

## 2015-04-01 DIAGNOSIS — M898X6 Other specified disorders of bone, lower leg: Secondary | ICD-10-CM

## 2015-04-01 DIAGNOSIS — Z0000 Encounter for general adult medical examination without abnormal findings: Secondary | ICD-10-CM

## 2015-04-01 DIAGNOSIS — L509 Urticaria, unspecified: Secondary | ICD-10-CM

## 2015-04-01 DIAGNOSIS — I10 Essential (primary) hypertension: Secondary | ICD-10-CM

## 2015-04-01 DIAGNOSIS — N3941 Urge incontinence: Secondary | ICD-10-CM

## 2015-04-01 LAB — BASIC METABOLIC PANEL
BUN: 20 mg/dL (ref 6–23)
CHLORIDE: 104 meq/L (ref 96–112)
CO2: 31 meq/L (ref 19–32)
Calcium: 10.4 mg/dL (ref 8.4–10.5)
Creatinine, Ser: 1.11 mg/dL (ref 0.40–1.20)
GFR: 65.42 mL/min (ref >60.00)
Glucose, Bld: 91 mg/dL (ref 70–99)
POTASSIUM: 4.4 meq/L (ref 3.5–5.1)
Sodium: 140 mEq/L (ref 135–145)

## 2015-04-01 LAB — LIPID PANEL
CHOL/HDL RATIO: 2
Cholesterol: 201 mg/dL — ABNORMAL HIGH (ref 0–200)
HDL: 88.1 mg/dL (ref >39.00)
LDL CALC: 103 mg/dL — AB (ref 0–99)
NonHDL: 112.9
Triglycerides: 49 mg/dL (ref 0.0–149.0)
VLDL: 9.8 mg/dL (ref 0.0–40.0)

## 2015-04-01 LAB — HEPATIC FUNCTION PANEL
ALBUMIN: 4.2 g/dL (ref 3.5–5.2)
ALK PHOS: 65 U/L (ref 39–117)
ALT: 14 U/L (ref 0–35)
AST: 20 U/L (ref 0–37)
Bilirubin, Direct: 0.1 mg/dL (ref 0.0–0.3)
Total Bilirubin: 0.5 mg/dL (ref 0.2–1.2)
Total Protein: 7.5 g/dL (ref 6.0–8.3)

## 2015-04-01 LAB — CBC WITH DIFFERENTIAL/PLATELET
Basophils Absolute: 0 10*3/uL (ref 0.0–0.1)
Basophils Relative: 0.3 % (ref 0.0–3.0)
EOS ABS: 0.2 10*3/uL (ref 0.0–0.7)
EOS PCT: 2.2 % (ref 0.0–5.0)
HCT: 39.9 % (ref 36.0–46.0)
Hemoglobin: 13 g/dL (ref 12.0–15.0)
Lymphocytes Relative: 34.4 % (ref 12.0–46.0)
Lymphs Abs: 2.5 10*3/uL (ref 0.7–4.0)
MCHC: 32.6 g/dL (ref 30.0–36.0)
MCV: 81.6 fl (ref 78.0–100.0)
Monocytes Absolute: 0.6 10*3/uL (ref 0.1–1.0)
Monocytes Relative: 8.5 % (ref 3.0–12.0)
NEUTROS ABS: 4 10*3/uL (ref 1.4–7.7)
Neutrophils Relative %: 54.6 % (ref 43.0–77.0)
Platelets: 254 10*3/uL (ref 150.0–400.0)
RBC: 4.89 Mil/uL (ref 3.87–5.11)
RDW: 15.7 % — ABNORMAL HIGH (ref 11.5–15.5)
WBC: 7.4 10*3/uL (ref 4.0–10.5)

## 2015-04-01 LAB — POCT URINALYSIS DIPSTICK
Bilirubin, UA: NEGATIVE
Blood, UA: NEGATIVE
GLUCOSE UA: NEGATIVE
Ketones, UA: NEGATIVE
Nitrite, UA: NEGATIVE
Protein, UA: NEGATIVE
Spec Grav, UA: 1.01
Urobilinogen, UA: 0.2
pH, UA: 7

## 2015-04-01 LAB — IRON: IRON: 84 ug/dL (ref 42–145)

## 2015-04-01 LAB — TSH: TSH: 1.28 u[IU]/mL (ref 0.35–4.50)

## 2015-04-01 MED ORDER — HYDROCHLOROTHIAZIDE 12.5 MG PO TABS: 12.5000 mg | ORAL_TABLET | Freq: Every day | ORAL | Status: DC

## 2015-04-01 MED ORDER — AMITRIPTYLINE HCL 10 MG PO TABS: 10.0000 mg | ORAL_TABLET | Freq: Every day | ORAL | Status: DC

## 2015-04-01 MED ORDER — AMLODIPINE BESYLATE 5 MG PO TABS: ORAL_TABLET | ORAL | Status: DC

## 2015-04-01 NOTE — Patient Instructions (Signed)
Norvasc 5 mg........... one half tablet twice daily  Hydrocort thiazide 12.5 mg..... One tablet daily in the morning  Check your blood pressure daily in the morning  Return in second week in May for follow-up  X-ray of your right lower leg at the main office today........Marland Kitchen we will call you the report  Carbohydrate free diet......... walk 20 minutes daily

## 2015-04-01 NOTE — Progress Notes (Signed)
   Subjective:    Patient ID: Karen Pope, female    DOB: 07-23-1961, 54 y.o.   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 Who comes in today for general physical examination because of a history of hypertension and obesity  She was on Norvasc 5 mg daily but it causes a headache so she cuts the dose in half. And she will he takes the Hydrocort thiazide 12.5 mg when necessary instead a daily. Her blood pressures 160/90 right arm sitting position initially repeat by me same  She gets routine eye care, dental care, BSE monthly, annual mammography, last 1 2016 normal, colonoscopy 2011 normal vaccinations up-to-date  She still is having a monthly.. She sees her GYN Dr. Raphael Gibney on a regular basis. She's posterior back for an ultrasound-guided evaluation of her uterus.  She's had pain in her right lower extremities for about 2-1/2 months. She points to lateral portion of her right tibia as a source of pain. No history of trauma.  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 She does not exercise on a regular basis   Review of Systems  Constitutional: Negative.   HENT: Negative.   Eyes: Negative.   Respiratory: Negative.   Cardiovascular: Negative.   Gastrointestinal: Negative.   Endocrine: Negative.   Genitourinary: Negative.   Musculoskeletal: Negative.   Skin: Negative.   Allergic/Immunologic: Negative.   Neurological: Negative.   Hematological: Negative.   Psychiatric/Behavioral: Negative.        Objective:   Physical Exam  Constitutional: She appears well-developed and well-nourished.  HENT:  Head: Normocephalic and atraumatic.  Right Ear: External ear normal.  Left Ear: External ear normal.  Nose: Nose normal.  Mouth/Throat: Oropharynx is clear and moist.  Eyes: EOM are normal. Pupils are equal, round, and reactive to light.  Neck: Normal range of motion. Neck supple. No JVD present.  No tracheal deviation present. No thyromegaly present.  Cardiovascular: Normal rate, regular rhythm, normal heart sounds and intact distal pulses.  Exam reveals no gallop and no friction rub.   No murmur heard. Pulmonary/Chest: Effort normal and breath sounds normal. No stridor. No respiratory distress. She has no wheezes. She has no rales. She exhibits no tenderness.  Abdominal: Soft. Bowel sounds are normal. She exhibits no distension and no mass. There is no tenderness. There is no rebound and no guarding.  Genitourinary:  Bilateral breast exam normal pelvic and rectal via GYN therefore not repeated  Musculoskeletal: Normal range of motion.  Examination of the right lower extremity the knee appears normal the ankles normal. Pulses are normal. A few small minor superficial varicosities. There is no palpable tenderness.  Lymphadenopathy:    She has no cervical adenopathy.  Neurological: She is alert. She has normal reflexes. No cranial nerve deficit. She exhibits normal muscle tone. Coordination normal.  Skin: Skin is warm and dry. No rash noted. No erythema. No pallor.  Total body skin exam normal  Psychiatric: She has a normal mood and affect. Her behavior is normal. Judgment and thought content normal.          Assessment & Plan:  Healthy female  Overweight........ once again discussed diet exercise and weight loss  Hypertension not at goal......... Norvasc 2.5 mg twice a day Hydrocort thiazide daily BP check daily follow-up in a week  Pain right lower extremity no history of trauma 2-1/2 months......... x-ray tibia  Dysfunction uterine bleeding......... followed by GYN

## 2015-07-31 ENCOUNTER — Encounter: Payer: Self-pay | Admitting: Adult Health

## 2015-07-31 ENCOUNTER — Ambulatory Visit (INDEPENDENT_AMBULATORY_CARE_PROVIDER_SITE_OTHER): Payer: 59 | Admitting: Adult Health

## 2015-07-31 VITALS — BP 132/78 | Temp 98.4°F | Ht 66.9 in | Wt 205.3 lb

## 2015-07-31 DIAGNOSIS — Z113 Encounter for screening for infections with a predominantly sexual mode of transmission: Secondary | ICD-10-CM | POA: Diagnosis not present

## 2015-07-31 LAB — POCT URINALYSIS DIPSTICK
Bilirubin, UA: NEGATIVE
Blood, UA: NEGATIVE
Glucose, UA: NEGATIVE
KETONES UA: NEGATIVE
LEUKOCYTES UA: NEGATIVE
Nitrite, UA: NEGATIVE
PH UA: 5.5
PROTEIN UA: NEGATIVE
Spec Grav, UA: 1.025
Urobilinogen, UA: 0.2

## 2015-07-31 NOTE — Progress Notes (Signed)
Subjective:    Patient ID: Karen Pope, female    DOB: 15-Feb-1961, 54 y.o.   MRN: [id number]  HPI 54 year old female who presents to the office today for intermittent odorous tan "slimy" vaginal discharge over the last 2 weeks with an occassional rash around vagina. She would also like to be checked for STD's. She has not had any sexual intercourse outside of her marriage, but believes that there might be something going on with her husband.   Denies any fevers, nausea, vomiting, diarrhea.   She denies any pelvic pain    Review of Systems  Constitutional: Negative for fever, chills and fatigue.  Genitourinary: Positive for vaginal discharge. Negative for frequency, hematuria, flank pain, vaginal bleeding, enuresis, difficulty urinating, genital sores, vaginal pain, pelvic pain and dyspareunia.  Musculoskeletal: Negative.   Allergic/Immunologic: Negative.   Neurological: Negative.   Hematological: Negative.   All other systems reviewed and are negative.  Past Medical History  Diagnosis Date  . Unspecified essential hypertension   . Lump or mass in breast   . Urticaria, unspecified   . Unspecified urinary incontinence   . Other disorder of menstruation and other abnormal bleeding from female genital tract   . Obesity, unspecified   . Unspecified essential hypertension   . Other speech disturbance(784.59)     Social History   Social History  . Marital Status: Married    Spouse Name: N/A  . Number of Children: N/A  . Years of Education: N/A   Occupational History  . Not on file.   Social History Main Topics  . Smoking status: Never Smoker   . Smokeless tobacco: Not on file  . Alcohol Use: Yes  . Drug Use: No  . Sexual Activity: Not on file   Other Topics Concern  . Not on file   Social History Narrative    No past surgical history on file.  No family history on file.  Allergies  Allergen Reactions  . Lisinopril     Current Outpatient Prescriptions on  File Prior to Visit  Medication Sig Dispense Refill  . albuterol (PROVENTIL HFA;VENTOLIN HFA) 108 (90 BASE) MCG/ACT inhaler Inhale 2 puffs into the lungs every 6 (six) hours as needed.    Marland Kitchen amLODipine (NORVASC) 5 MG tablet One half tab twice a day 100 tablet 3  . EPINEPHrine (EPI-PEN) 0.3 mg/0.3 mL DEVI Inject into the muscle once.    . hydrochlorothiazide (HYDRODIURIL) 12.5 MG tablet Take 1 tablet (12.5 mg total) by mouth daily. 90 tablet 3  . amitriptyline (ELAVIL) 10 MG tablet Take 1 tablet (10 mg total) by mouth at bedtime. (Patient not taking: Reported on 07/31/2015) 100 tablet 3  . escitalopram (LEXAPRO) 10 MG tablet TAKE 1 TABLET BY MOUTH EVERY DAY (Patient not taking: Reported on 07/31/2015) 90 tablet 5   No current facility-administered medications on file prior to visit.    BP 132/78 mmHg  Temp(Src) 98.4 F (36.9 C) (Oral)  Ht 5' 6.9" (1.699 m)  Wt 205 lb 4.8 oz (93.123 kg)  BMI 32.26 kg/m2  LMP 06/16/2015       Objective:   Physical Exam  Constitutional: She is oriented to person, place, and time. She appears well-developed and well-nourished. No distress.  Eyes: Right eye exhibits no discharge. Left eye exhibits no discharge.  Cardiovascular: Normal rate, regular rhythm, normal heart sounds and intact distal pulses.  Exam reveals no gallop and no friction rub.   No murmur heard. Pulmonary/Chest: Effort normal and  breath sounds normal. No respiratory distress. She has no wheezes. She has no rales. She exhibits no tenderness.  Abdominal: Soft. Bowel sounds are normal. She exhibits no distension. There is no tenderness. There is no rebound and no guarding.  Genitourinary: Vagina normal and uterus normal. No vaginal discharge found.  No abnormal discharge from vagina. No rash, lesions, or trauma noted in or around vagina.   Musculoskeletal: Normal range of motion. She exhibits no edema or tenderness.  Neurological: She is alert and oriented to person, place, and time.  Skin:  Skin is warm and dry. No rash noted. She is not diaphoretic. No erythema. No pallor.  Psychiatric: She has a normal mood and affect. Her behavior is normal. Judgment and thought content normal.  Vitals reviewed.      Assessment & Plan:  1. Screening for STD (sexually transmitted disease) - Cervicovaginal ancillary only - HIV antibody (with reflex) - Acute Hep Panel & Hep B Surface Ab - HSV(herpes smplx)abs-1+2(IgG+IgM)-bld - RPR - POC Urinalysis Dipstick - Will follow up with patient regarding labs.

## 2015-07-31 NOTE — Patient Instructions (Signed)
It was great meeting you!   I will follow up with you regarding your lab work. I did not see anything that appeared abnormal.   If you need anything in the meantime, please do not hesitate to let me know.

## 2015-07-31 NOTE — Progress Notes (Signed)
Pre visit review using our clinic review tool, if applicable. No additional management support is needed unless otherwise documented below in the visit note. 

## 2015-08-01 LAB — ACUTE HEP PANEL AND HEP B SURFACE AB
HCV AB: NEGATIVE
HEP B C IGM: NONREACTIVE
HEP B S AB: NEGATIVE
Hep A IgM: NONREACTIVE
Hepatitis B Surface Ag: NEGATIVE

## 2015-08-01 LAB — HIV ANTIBODY (ROUTINE TESTING W REFLEX): HIV 1&2 Ab, 4th Generation: NONREACTIVE

## 2015-08-01 LAB — RPR

## 2015-08-03 ENCOUNTER — Encounter: Payer: Self-pay | Admitting: Adult Health

## 2015-08-03 ENCOUNTER — Other Ambulatory Visit (HOSPITAL_COMMUNITY)
Admission: RE | Admit: 2015-08-03 | Discharge: 2015-08-03 | Disposition: A | Discharge status: Home / Self Care | Payer: 59 | Source: Ambulatory Visit | Attending: Adult Health | Admitting: Adult Health

## 2015-08-03 DIAGNOSIS — Z01419 Encounter for gynecological examination (general) (routine) without abnormal findings: Secondary | ICD-10-CM | POA: Insufficient documentation

## 2015-08-03 DIAGNOSIS — Z1151 Encounter for screening for human papillomavirus (HPV): Secondary | ICD-10-CM | POA: Diagnosis present

## 2015-08-03 DIAGNOSIS — Z113 Encounter for screening for infections with a predominantly sexual mode of transmission: Secondary | ICD-10-CM | POA: Insufficient documentation

## 2015-08-03 DIAGNOSIS — N760 Acute vaginitis: Secondary | ICD-10-CM | POA: Insufficient documentation

## 2015-08-03 LAB — HSV(HERPES SMPLX)ABS-I+II(IGG+IGM)-BLD
HSV 2 Glycoprotein G Ab, IgG: <0.1 IV
Herpes Simplex Vrs I&II-IgM Ab (EIA): 1.47 INDEX — ABNORMAL HIGH

## 2015-08-03 LAB — CERVICOVAGINAL ANCILLARY ONLY
BACTERIAL VAGINITIS: NEGATIVE
CANDIDA VAGINITIS: NEGATIVE

## 2015-08-03 LAB — CYTOLOGY - PAP

## 2015-08-03 NOTE — Addendum Note (Signed)
Addended by: Colleen Can on: 08/03/2015 10:53 AM   Modules accepted: Orders

## 2015-08-06 LAB — CERVICOVAGINAL ANCILLARY ONLY: Herpes: NEGATIVE

## 2015-09-02 ENCOUNTER — Encounter: Payer: Self-pay | Admitting: Gastroenterology

## 2016-02-09 ENCOUNTER — Telehealth: Payer: Self-pay | Admitting: Family Medicine

## 2016-02-09 NOTE — Telephone Encounter (Signed)
Pt states she would like to go back on escitalopram (LEXAPRO) 10 MG tablet,  But this med has not been filled since 07/2014.  Pt wanted a CPE, but told her not til October. Pt does not want to wait until then, so would like to come in to talk about going back on escitalopram (LEXAPRO) 10 MG tablet  No appointments until April , pt asked if she can see him sooner. Emphasized to pt this would be for med discussion only. Pt verbalized understanding.

## 2016-02-10 MED ORDER — ESCITALOPRAM OXALATE 10 MG PO TABS: 10.0000 mg | ORAL_TABLET | Freq: Every day | ORAL | Status: DC

## 2016-02-10 NOTE — Telephone Encounter (Signed)
Rx sent and Left message on machine for patient. 

## 2016-04-14 ENCOUNTER — Encounter: Payer: Self-pay | Admitting: Adult Health

## 2016-04-14 ENCOUNTER — Telehealth: Payer: Self-pay | Admitting: Adult Health

## 2016-04-14 ENCOUNTER — Ambulatory Visit (INDEPENDENT_AMBULATORY_CARE_PROVIDER_SITE_OTHER): Payer: 59 | Admitting: Adult Health

## 2016-04-14 VITALS — BP 128/70 | Temp 98.1°F | Wt 207.1 lb

## 2016-04-14 DIAGNOSIS — R5383 Other fatigue: Secondary | ICD-10-CM | POA: Diagnosis not present

## 2016-04-14 LAB — VITAMIN D 25 HYDROXY (VIT D DEFICIENCY, FRACTURES): VITD: 21.6 ng/mL — ABNORMAL LOW (ref 30.00–100.00)

## 2016-04-14 LAB — CBC WITH DIFFERENTIAL/PLATELET
BASOS ABS: 0 10*3/uL (ref 0.0–0.1)
BASOS PCT: 0.5 % (ref 0.0–3.0)
EOS ABS: 0.2 10*3/uL (ref 0.0–0.7)
Eosinophils Relative: 2.3 % (ref 0.0–5.0)
HCT: 37.8 % (ref 36.0–46.0)
Hemoglobin: 12.4 g/dL (ref 12.0–15.0)
LYMPHS ABS: 2.5 10*3/uL (ref 0.7–4.0)
LYMPHS PCT: 34.1 % (ref 12.0–46.0)
MCHC: 32.7 g/dL (ref 30.0–36.0)
MCV: 82 fl (ref 78.0–100.0)
Monocytes Absolute: 0.6 10*3/uL (ref 0.1–1.0)
Monocytes Relative: 8.4 % (ref 3.0–12.0)
NEUTROS ABS: 4 10*3/uL (ref 1.4–7.7)
Neutrophils Relative %: 54.7 % (ref 43.0–77.0)
PLATELETS: 285 10*3/uL (ref 150.0–400.0)
RBC: 4.61 Mil/uL (ref 3.87–5.11)
RDW: 15.4 % (ref 11.5–15.5)
WBC: 7.3 10*3/uL (ref 4.0–10.5)

## 2016-04-14 LAB — VITAMIN B12: VITAMIN B 12: 421 pg/mL (ref 211–911)

## 2016-04-14 LAB — TSH: TSH: 1.25 u[IU]/mL (ref 0.35–4.50)

## 2016-04-14 NOTE — Progress Notes (Signed)
Subjective:    Patient ID: Karen Pope, female    DOB: [birth year], 55 y.o.   MRN: [id number]  HPI 55 year old female who presents to the office today for fatigue " for months". She is currently taking care of her mother,and is having to work a lot. She works in [employer] Strategy [employer]. When she wakes up in the morning she feels tired, is able to go on throughout the day but when she gets home and has to do home work and then get caught up on work.   She is not exercising, she is not going out with friends, she feels like " I do not have a life outside of work and home"  She has been taking Lexapro 5 mg for some time and recently went up to 10 mg.   She feels as though this is a form of depression.  Review of Systems  Constitutional: Positive for activity change and fatigue.  Respiratory: Negative.   Cardiovascular: Negative.   Musculoskeletal: Negative.   Neurological: Negative.    Past Medical History  Diagnosis Date  . Unspecified essential hypertension   . Lump or mass in breast   . Urticaria, unspecified   . Unspecified urinary incontinence   . Other disorder of menstruation and other abnormal bleeding from female genital tract   . Obesity, unspecified   . Unspecified essential hypertension   . Other speech disturbance(784.59)     Social History   Social History  . Marital Status: Married    Spouse Name: N/A  . Number of Children: N/A  . Years of Education: N/A   Occupational History  . Not on file.   Social History Main Topics  . Smoking status: Never Smoker   . Smokeless tobacco: Not on file  . Alcohol Use: Yes  . Drug Use: No  . Sexual Activity: Not on file   Other Topics Concern  . Not on file   Social History Narrative    No past surgical history on file.  No family history on file.  Allergies  Allergen Reactions  . Lisinopril     Current Outpatient Prescriptions on File Prior to Visit  Medication Sig Dispense Refill  . albuterol (PROVENTIL  HFA;VENTOLIN HFA) 108 (90 BASE) MCG/ACT inhaler Inhale 2 puffs into the lungs every 6 (six) hours as needed.    Marland Kitchen amLODipine (NORVASC) 5 MG tablet One half tab twice a day 100 tablet 3  . EPINEPHrine (EPI-PEN) 0.3 mg/0.3 mL DEVI Inject into the muscle once.    . escitalopram (LEXAPRO) 10 MG tablet Take 1 tablet (10 mg total) by mouth daily. 90 tablet 5  . hydrochlorothiazide (HYDRODIURIL) 12.5 MG tablet Take 1 tablet (12.5 mg total) by mouth daily. 90 tablet 3  . amitriptyline (ELAVIL) 10 MG tablet Take 1 tablet (10 mg total) by mouth at bedtime. (Patient not taking: Reported on 07/31/2015) 100 tablet 3   No current facility-administered medications on file prior to visit.    BP 128/70 mmHg  Temp(Src) 98.1 F (36.7 C) (Oral)  Wt 207 lb 1.6 oz (93.94 kg)       Objective:   Physical Exam  Constitutional: She is oriented to person, place, and time. She appears well-developed and well-nourished. No distress.  Neck: Normal range of motion. Neck supple. No thyromegaly present.  Cardiovascular: Normal rate, regular rhythm, normal heart sounds and intact distal pulses.  Exam reveals no gallop and no friction rub.   No murmur heard. Pulmonary/Chest: Effort normal  and breath sounds normal. No respiratory distress. She has no wheezes. She has no rales. She exhibits no tenderness.  Lymphadenopathy:    She has no cervical adenopathy.  Neurological: She is alert and oriented to person, place, and time.  Skin: Skin is warm and dry. No rash noted. She is not diaphoretic. No erythema. No pallor.  Psychiatric: She has a normal mood and affect. Her behavior is normal. Judgment and thought content normal.  Nursing note and vitals reviewed.     Assessment & Plan:  1. Other fatigue - Apparent depression.  - CBC with Differential/Platelet - TSH - Vitamin B12 - Vitamin D, 25-hydroxy - Continue with 10 mg Lexapro  - List of local therapist given  - Advised exercise and getting outside.  - Follow up     Dorothyann Peng, NP

## 2016-04-14 NOTE — Patient Instructions (Signed)
It was great seeing you again!  I will follow up with you regarding your blood work.   Continue with 10 Lexapro for the next 3 weeks.   Follow up as needed

## 2016-04-14 NOTE — Telephone Encounter (Signed)
Pt is returning cory call °

## 2016-04-14 NOTE — Telephone Encounter (Signed)
Left vm to call back regarding labs

## 2016-04-14 NOTE — Telephone Encounter (Signed)
Notified patient of lab results.  Patient verbalized understanding.  

## 2016-04-26 ENCOUNTER — Other Ambulatory Visit: Payer: Self-pay | Admitting: Family Medicine

## 2016-05-17 LAB — HM MAMMOGRAPHY

## 2016-05-18 ENCOUNTER — Telehealth: Payer: Self-pay | Admitting: Family Medicine

## 2016-05-18 ENCOUNTER — Encounter: Payer: Self-pay | Admitting: Adult Health

## 2016-05-18 NOTE — Telephone Encounter (Signed)
Care Advice Given Per Guideline HOME CARE: You should be able to treat this at home. REASSURANCE: Most tick bites are harmless and can be treated at home. The spread of disease by ticks is rare. WOOD TICK REMOVAL - BEST METHOD: * Use a tweezers and grasp the wood tick close to the skin (on its head). * Pull the wood tick straight upward without twisting or crushing it. * Maintain a steady pressure until it releases its grip. * If tweezers aren't available, use fingers, a loop of thread around the jaws, or a needle between the jaws for traction. * Note: covering the tick with petroleum jelly, nail polish or rubbing alcohol doesn't work. Neither does touching the tick with a hot or cold object. * Wash your hands. TINY DEER TICK REMOVAL: * Deer ticks are very small and need to be scraped off with a credit card edge or the edge of a knife blade. * Wash your hands. TICK'S HEAD REMOVAL: If the wood tick's head breaks off in the skin, remove it. Clean the skin. Then use a sterile needle to uncover the head and lift it out or scrape it off. If a very small piece of the head remains, the skin will eventually slough it off. ANTIBIOTIC OINTMENT: Wash the wound and your hands with soap and water after removal to prevent catching any tick disease. Apply antibiotic ointment (OTC) to the bite once. EXPECTED COURSE: Tick bites normally don't itch or hurt. That's why they often go unnoticed. TETANUS BOOSTER: If last tetanus shot was given over 10 years ago, needs a booster. Call PCP during regular office hours (within 3 days) [place]: * Place tick in a sealed container (e.g., glass jar, zip lock plastic bag). CALL BACK IF: * You can't remove the tick or the tick's head * Fever or rash occur in the next 2 weeks * Bite begins to look infected * You become worse. CARE ADVICE given per Tick Bites (Adult) guideline.

## 2016-05-18 NOTE — Telephone Encounter (Signed)
Final Disposition User [place], RN, Levada Dy Disagree/Comply: Comply  No directions for homecare included in note.

## 2016-05-18 NOTE — Telephone Encounter (Signed)
Patient Name: Karen Pope DOB: [birth year] Initial Comment Caller states she had a tick bite on Saturday and she has been watching it, has diffused redness around it. Nurse Assessment Nurse: Christel Mormon, RN, Levada Dy Date/Time (Eastern Time): 05/18/2016 3:24:29 PM Confirm and document reason for call. If symptomatic, describe symptoms. You must click the next button to save text entered. ---Caller states she removed a tick Saturday and there was a little mark on her medial right calf. Sunday it was fine. Late Sunday and early Monday it started itching and got a little blister. By Tuesday, there is a little diffuse redness going away from the bite. No soreness, fever, chills. No red streaks. Has the patient traveled out of the country within the last 30 days? ---No Does the patient have any new or worsening symptoms? ---Yes Will a triage be completed? ---Yes Related visit to physician within the last 2 weeks? ---No Does the PT have any chronic conditions? (i.e. diabetes, asthma, etc.) ---No Is this a behavioral health or substance abuse call? ---No Guidelines Guideline Title Affirmed Question Affirmed Notes Tick Bite Tick bite with no complications (all triage questions negative) Final Disposition [place], RN, Levada Dy Disagree/Comply: Comply

## 2016-05-19 NOTE — Telephone Encounter (Signed)
Karen Pope agrees on home advice. If not better then patient needs to be seen (part of home advice).

## 2016-05-21 ENCOUNTER — Other Ambulatory Visit: Payer: Self-pay | Admitting: Adult Health

## 2016-05-21 ENCOUNTER — Other Ambulatory Visit: Payer: Self-pay | Admitting: Family Medicine

## 2016-05-23 NOTE — Telephone Encounter (Signed)
Rx refill sent to pharmacy. 

## 2016-05-28 ENCOUNTER — Ambulatory Visit (INDEPENDENT_AMBULATORY_CARE_PROVIDER_SITE_OTHER): Payer: 59 | Admitting: Family Medicine

## 2016-05-28 ENCOUNTER — Encounter: Payer: Self-pay | Admitting: Family Medicine

## 2016-05-28 VITALS — BP 110/80 | HR 85 | Temp 98.3°F | Resp 18 | Ht 66.9 in | Wt 207.5 lb

## 2016-05-28 DIAGNOSIS — R1031 Right lower quadrant pain: Secondary | ICD-10-CM | POA: Diagnosis not present

## 2016-05-28 DIAGNOSIS — R102 Pelvic and perineal pain: Secondary | ICD-10-CM

## 2016-05-28 DIAGNOSIS — G8929 Other chronic pain: Secondary | ICD-10-CM | POA: Diagnosis not present

## 2016-05-28 LAB — POCT URINALYSIS DIPSTICK
Bilirubin, UA: NEGATIVE
Blood, UA: NEGATIVE
Glucose, UA: NEGATIVE
KETONES UA: NEGATIVE
Nitrite, UA: NEGATIVE
PROTEIN UA: NEGATIVE
SPEC GRAV UA: 1.025
Urobilinogen, UA: 0.2
pH, UA: 6

## 2016-05-28 NOTE — Assessment & Plan Note (Signed)
New.  Pt has had sxs for 'at least a month'.  Does not have an acute abdomen on exam today and TTP over R pelvis is mild.  No rebound or guarding.  Given her heavy periods and location of pain, I feel this may be ovarian in nature.  She will need to have a pelvic US done but unfortunately, I am not able to order this today.  I told her that depending on results of [place], she may need to f/u w/ Dr Raphael Gibney.  Pt is in agreement.  Will follow.

## 2016-05-28 NOTE — Patient Instructions (Signed)
Follow up as needed I'll send a message to your primary and we'll order a pelvic ultrasound Drink plenty of fluids Tylenol or ibuprofen for pain Call with any questions or concerns Hang in there!!!

## 2016-05-28 NOTE — Progress Notes (Signed)
Pre-visit discussion using our clinic review tool. No additional management support is needed unless otherwise documented below in the visit note.  

## 2016-05-28 NOTE — Progress Notes (Signed)
   Subjective:    Patient ID: Karen Pope, female    DOB: 12-23-1960, 55 y.o.   MRN: [id number]  HPI RLQ pain- 'for at least a month' pt has point tenderness in RLQ/pelvis.  Occuring daily, 'getting a little bit more progressive, a little more sharp'.  Pain is worse w/ coughing, bending.  Pain is typically a dull ache w/ episodes of sharp, stabbing pain.  No changes to bladder or bowel habits.   No fevers.  Still having heavy menstrual cycles- irregular.  GYN- String[facility], due to heavy menses, he wanted to do a sonohystagram last summer but this was never done due to deductible costs.  No associated N/V.  No change in activity level or recent heavy lifting.  No radiation or change in location of pain.   Review of Systems For ROS see HPI     Objective:   Physical Exam  Constitutional: She is oriented to person, place, and time. She appears well-developed and well-nourished. No distress.  HENT:  Head: Normocephalic and atraumatic.  Abdominal: Soft. Bowel sounds are normal. She exhibits no distension. There is tenderness (mild TTP over R pelvis.  no TTP over McBurney's point.). There is no rebound and no guarding.  Neurological: She is alert and oriented to person, place, and time.  Skin: Skin is warm and dry.  Psychiatric: She has a normal mood and affect. Her behavior is normal. Thought content normal.  Vitals reviewed.         Assessment & Plan:

## 2016-05-29 ENCOUNTER — Other Ambulatory Visit: Payer: Self-pay | Admitting: Adult Health

## 2016-06-02 ENCOUNTER — Other Ambulatory Visit: Payer: Self-pay | Admitting: Adult Health

## 2016-06-02 DIAGNOSIS — R102 Pelvic and perineal pain: Secondary | ICD-10-CM

## 2016-06-07 ENCOUNTER — Other Ambulatory Visit: Payer: Self-pay | Admitting: Adult Health

## 2016-06-07 DIAGNOSIS — R1031 Right lower quadrant pain: Secondary | ICD-10-CM

## 2016-06-17 ENCOUNTER — Ambulatory Visit
Admission: RE | Admit: 2016-06-17 | Discharge: 2016-06-17 | Disposition: A | Discharge status: Home / Self Care | Payer: 59 | Source: Ambulatory Visit | Attending: Adult Health | Admitting: Adult Health

## 2016-06-17 DIAGNOSIS — R1031 Right lower quadrant pain: Secondary | ICD-10-CM

## 2016-06-17 DIAGNOSIS — R102 Pelvic and perineal pain: Secondary | ICD-10-CM

## 2016-06-23 ENCOUNTER — Telehealth: Payer: Self-pay | Admitting: Adult Health

## 2016-06-23 NOTE — Telephone Encounter (Signed)
Spoke to Karen Pope on the phone. She continues to have a " twinge" in her RLQ from time to time. The pain does not stop her from doing her ADL's. I brought up the notion of doing a CT of the abdomen. She is going to follow up with GYN first.

## 2016-07-14 ENCOUNTER — Other Ambulatory Visit: Payer: Self-pay | Admitting: Adult Health

## 2016-07-16 ENCOUNTER — Other Ambulatory Visit: Payer: Self-pay | Admitting: Family Medicine

## 2016-07-18 NOTE — Telephone Encounter (Signed)
Ok to refill, 90 + 3 refills

## 2016-07-18 NOTE — Telephone Encounter (Signed)
Pt. Has changed to you

## 2017-02-24 ENCOUNTER — Other Ambulatory Visit: Payer: Self-pay | Admitting: Adult Health

## 2017-02-24 ENCOUNTER — Other Ambulatory Visit: Payer: Self-pay | Admitting: Family Medicine

## 2017-02-24 MED ORDER — ESCITALOPRAM OXALATE 10 MG PO TABS: 10.0000 mg | ORAL_TABLET | Freq: Every day | ORAL | 1 refills | Status: DC

## 2017-02-24 NOTE — Telephone Encounter (Signed)
Prescription sent in  

## 2017-02-24 NOTE — Telephone Encounter (Signed)
Ok to refill 

## 2017-03-08 ENCOUNTER — Ambulatory Visit (INDEPENDENT_AMBULATORY_CARE_PROVIDER_SITE_OTHER): Payer: 59 | Admitting: Adult Health

## 2017-03-08 VITALS — BP 124/68 | Temp 97.8°F | Ht 66.75 in | Wt 209.0 lb

## 2017-03-08 DIAGNOSIS — I10 Essential (primary) hypertension: Secondary | ICD-10-CM | POA: Diagnosis not present

## 2017-03-08 DIAGNOSIS — E559 Vitamin D deficiency, unspecified: Secondary | ICD-10-CM

## 2017-03-08 DIAGNOSIS — R1031 Right lower quadrant pain: Secondary | ICD-10-CM

## 2017-03-08 DIAGNOSIS — Z0000 Encounter for general adult medical examination without abnormal findings: Secondary | ICD-10-CM

## 2017-03-08 DIAGNOSIS — H9313 Tinnitus, bilateral: Secondary | ICD-10-CM

## 2017-03-08 LAB — LIPID PANEL
CHOL/HDL RATIO: 2
Cholesterol: 211 mg/dL — ABNORMAL HIGH (ref 0–200)
HDL: 87.1 mg/dL (ref >39.00)
LDL Cholesterol: 114 mg/dL — ABNORMAL HIGH (ref 0–99)
NonHDL: 124.08
TRIGLYCERIDES: 48 mg/dL (ref 0.0–149.0)
VLDL: 9.6 mg/dL (ref 0.0–40.0)

## 2017-03-08 LAB — HEPATIC FUNCTION PANEL
ALBUMIN: 4.3 g/dL (ref 3.5–5.2)
ALT: 13 U/L (ref 0–35)
AST: 16 U/L (ref 0–37)
Alkaline Phosphatase: 61 U/L (ref 39–117)
BILIRUBIN DIRECT: 0.1 mg/dL (ref 0.0–0.3)
Total Bilirubin: 0.4 mg/dL (ref 0.2–1.2)
Total Protein: 6.9 g/dL (ref 6.0–8.3)

## 2017-03-08 LAB — CBC WITH DIFFERENTIAL/PLATELET
BASOS PCT: 0.3 % (ref 0.0–3.0)
Basophils Absolute: 0 10*3/uL (ref 0.0–0.1)
Eosinophils Absolute: 0.2 10*3/uL (ref 0.0–0.7)
Eosinophils Relative: 2.6 % (ref 0.0–5.0)
HCT: 38.9 % (ref 36.0–46.0)
Hemoglobin: 12.5 g/dL (ref 12.0–15.0)
Lymphocytes Relative: 33.6 % (ref 12.0–46.0)
Lymphs Abs: 2.1 10*3/uL (ref 0.7–4.0)
MCHC: 32.1 g/dL (ref 30.0–36.0)
MCV: 83.4 fl (ref 78.0–100.0)
MONO ABS: 0.5 10*3/uL (ref 0.1–1.0)
Monocytes Relative: 8.8 % (ref 3.0–12.0)
Neutro Abs: 3.4 10*3/uL (ref 1.4–7.7)
Neutrophils Relative %: 54.7 % (ref 43.0–77.0)
PLATELETS: 233 10*3/uL (ref 150.0–400.0)
RBC: 4.67 Mil/uL (ref 3.87–5.11)
RDW: 15.4 % (ref 11.5–15.5)
WBC: 6.1 10*3/uL (ref 4.0–10.5)

## 2017-03-08 LAB — BASIC METABOLIC PANEL
BUN: 19 mg/dL (ref 6–23)
CHLORIDE: 105 meq/L (ref 96–112)
CO2: 31 mEq/L (ref 19–32)
CREATININE: 0.96 mg/dL (ref 0.40–1.20)
Calcium: 10.2 mg/dL (ref 8.4–10.5)
GFR: 76.82 mL/min (ref >60.00)
GLUCOSE: 95 mg/dL (ref 70–99)
Potassium: 4.5 mEq/L (ref 3.5–5.1)
Sodium: 142 mEq/L (ref 135–145)

## 2017-03-08 LAB — VITAMIN D 25 HYDROXY (VIT D DEFICIENCY, FRACTURES): VITD: 29.37 ng/mL — ABNORMAL LOW (ref 30.00–100.00)

## 2017-03-08 LAB — TSH: TSH: 1.29 u[IU]/mL (ref 0.35–4.50)

## 2017-03-08 MED ORDER — EPINEPHRINE 0.3 MG/0.3ML IJ SOAJ: 0.3000 mg | Freq: Once | INTRAMUSCULAR | 1 refills | Status: AC

## 2017-03-08 MED ORDER — ALBUTEROL SULFATE HFA 108 (90 BASE) MCG/ACT IN AERS: 2.0000 | INHALATION_SPRAY | Freq: Four times a day (QID) | RESPIRATORY_TRACT | 0 refills | Status: DC | PRN

## 2017-03-08 NOTE — Patient Instructions (Signed)
It was great seeing you today   I will follow up with you regarding your labs.   Please let me know if you need anything.   Good luck on the new job !

## 2017-03-08 NOTE — Progress Notes (Signed)
Subjective:    Patient ID: Karen Pope, female    DOB: 06/08/1961, 56 y.o.   MRN: [id number]  HPI  Patient presents for yearly preventative medicine examination.She is a pleasant 56 year old female who  has a past medical history of Lump or mass in breast; Obesity, unspecified; Other disorder of menstruation and other abnormal bleeding from female genital tract; Other speech disturbance(784.59); Unspecified essential hypertension; Unspecified essential hypertension; Unspecified urinary incontinence; and Urticaria, unspecified.  All immunizations and health maintenance protocols were reviewed with the patient and needed orders were placed. She is up to date on vaccinations.   Appropriate screening laboratory values were ordered for the patient including screening of hyperlipidemia, renal function and hepatic function.  Medication reconciliation,  past medical history, social history, problem list and allergies were reviewed in detail with the patient  Goals were established with regard to weight loss, exercise, and  diet in compliance with medications. She is not working out and is eating healthy  End of life planning was discussed.  She is happy to report that she will be changing jobs and will be working with [employer]   Her last colonoscopy was in 2010, she is do for repeat in 2020. She is up to date on her vision and dental screens. She is up to date on her mammograms  She takes Amlodipine 2.5 mg and HCTZ 12.5 PRN mg for hypertension.   She continues to have a " twinge" in her right lower quadrant and continues to have her period. She was supposed to follow up with GYN but never did.   She continues to complain of Tinnitus. Would like to see audiology but does not want to until after her insurance changes.   Review of Systems  Constitutional: Negative.   HENT: Positive for tinnitus.   Eyes: Negative.   Respiratory: Negative.   Cardiovascular: Negative.     Gastrointestinal: Positive for abdominal pain.  Endocrine: Negative.   Genitourinary: Negative.   Musculoskeletal: Negative.   Skin: Negative.   Allergic/Immunologic: Negative.   Neurological: Negative.   Hematological: Negative.   Psychiatric/Behavioral: Negative.   All other systems reviewed and are negative.  Past Medical History:  Diagnosis Date  . Lump or mass in breast   . Obesity, unspecified   . Other disorder of menstruation and other abnormal bleeding from female genital tract   . Other speech disturbance(784.59)   . Unspecified essential hypertension   . Unspecified essential hypertension   . Unspecified urinary incontinence   . Urticaria, unspecified     Social History   Social History  . Marital status: Married    Spouse name: N/A  . Number of children: N/A  . Years of education: N/A   Occupational History  . Not on file.   Social History Main Topics  . Smoking status: Never Smoker  . Smokeless tobacco: Not on file  . Alcohol use Yes  . Drug use: No  . Sexual activity: Not on file   Other Topics Concern  . Not on file   Social History Narrative  . No narrative on file    No past surgical history on file.  No family history on file.  Allergies  Allergen Reactions  . Lisinopril     Current Outpatient Prescriptions on File Prior to Visit  Medication Sig Dispense Refill  . albuterol (PROVENTIL HFA;VENTOLIN HFA) 108 (90 BASE) MCG/ACT inhaler Inhale 2 puffs into the lungs every 6 (six) hours as needed.    Marland Kitchen  amitriptyline (ELAVIL) 10 MG tablet Take 1 tablet (10 mg total) by mouth at bedtime. (Patient not taking: Reported on 07/31/2015) 100 tablet 3  . amLODipine (NORVASC) 5 MG tablet Take one-half tablet by  mouth twice a day 90 tablet 3  . Cholecalciferol (VITAMIN D3) 2000 units TABS Take by mouth.    . EPINEPHrine (EPI-PEN) 0.3 mg/0.3 mL DEVI Inject into the muscle once.    . escitalopram (LEXAPRO) 10 MG tablet Take 1 tablet (10 mg total) by  mouth daily. 90 tablet 1  . hydrochlorothiazide (HYDRODIURIL) 12.5 MG tablet Take 1 tablet by mouth  daily 90 tablet 1   No current facility-administered medications on file prior to visit.     There were no vitals taken for this visit.      Objective:   Physical Exam  Constitutional: She is oriented to person, place, and time. She appears well-developed and well-nourished. No distress.  HENT:  Head: Normocephalic and atraumatic.  Right Ear: External ear normal.  Left Ear: External ear normal.  Nose: Nose normal.  Mouth/Throat: Oropharynx is clear and moist. No oropharyngeal exudate.  Eyes: Conjunctivae and EOM are normal. Pupils are equal, round, and reactive to light. Right eye exhibits no discharge. Left eye exhibits no discharge. No scleral icterus.  Neck: Normal range of motion. Neck supple. No JVD present. Carotid bruit is not present. No tracheal deviation present. No thyromegaly present.  Cardiovascular: Normal rate, regular rhythm, normal heart sounds and intact distal pulses.  Exam reveals no gallop and no friction rub.   No murmur heard. Pulmonary/Chest: Effort normal and breath sounds normal. No stridor. No respiratory distress. She has no wheezes. She has no rales. She exhibits no tenderness.  Abdominal: Soft. Bowel sounds are normal. She exhibits no distension and no mass. There is no tenderness. There is no rebound and no guarding.  Genitourinary:  Genitourinary Comments: Breast Exam: No masses, lumps, dimpling or discharge    Musculoskeletal: Normal range of motion. She exhibits no edema, tenderness or deformity.  Lymphadenopathy:    She has no cervical adenopathy.  Neurological: She is alert and oriented to person, place, and time. She has normal reflexes. She displays normal reflexes. No cranial nerve deficit. She exhibits normal muscle tone. Coordination normal.  Skin: Skin is warm and dry. No rash noted. She is not diaphoretic. No erythema. No pallor.    Psychiatric: She has a normal mood and affect. Her behavior is normal. Judgment and thought content normal.  Nursing note and vitals reviewed.     Assessment & Plan:  1. Routine general medical examination at a health care facility  - Basic metabolic panel - CBC with Differential/Platelet - Hepatic function panel - Lipid panel - TSH - Follow up in one year or sooner - Work on incorporating regular exercise into daily regimen  - Continue to eat healthy  2. Essential hypertension - Well controlled. No change in medications  - Basic metabolic panel - CBC with Differential/Platelet - Hepatic function panel - Lipid panel - TSH  3. Vitamin D deficiency  - Vitamin D, 25-hydroxy  4. Tinnitus of both ears - She will let me know when she is ready to see Audiology   5. Right lower quadrant pain - Follow up with GYN   Dorothyann Peng, NP

## 2017-06-03 ENCOUNTER — Other Ambulatory Visit: Payer: Self-pay | Admitting: Adult Health

## 2017-08-04 ENCOUNTER — Ambulatory Visit (INDEPENDENT_AMBULATORY_CARE_PROVIDER_SITE_OTHER)
Admission: RE | Admit: 2017-08-04 | Discharge: 2017-08-04 | Disposition: A | Discharge status: Home / Self Care | Payer: 59 | Source: Ambulatory Visit | Attending: Adult Health | Admitting: Adult Health

## 2017-08-04 ENCOUNTER — Ambulatory Visit (INDEPENDENT_AMBULATORY_CARE_PROVIDER_SITE_OTHER): Payer: 59 | Admitting: Adult Health

## 2017-08-04 ENCOUNTER — Encounter: Payer: Self-pay | Admitting: Adult Health

## 2017-08-04 VITALS — BP 146/82 | Temp 98.3°F | Wt 213.0 lb

## 2017-08-04 DIAGNOSIS — M79671 Pain in right foot: Secondary | ICD-10-CM | POA: Diagnosis not present

## 2017-08-04 NOTE — Progress Notes (Signed)
Subjective:    Patient ID: Karen Pope, female    DOB: [birth year], 56 y.o.   MRN: [id number]  HPI  56 year old female who  has a past medical history of Lump or mass in breast; Obesity, unspecified; Other disorder of menstruation and other abnormal bleeding from female genital tract; Other speech disturbance(784.59); Unspecified essential hypertension; Unspecified essential hypertension; Unspecified urinary incontinence; and Urticaria, unspecified. She presents to the office today for an acute issue of right foot pain. She reports that last night she was moving a 10 pound power supply at home and lost her grip. The corner of the power supply landed on her foot. She reports pain, bruising and swelling to the top of her right foot. Pain is worse with dorsiflexion. She denies any numbness or tingling in her right foot and does not feel as though she has has any loss of ROM.   She has not used any medications for pain   Review of Systems See HPI   Past Medical History:  Diagnosis Date  . Lump or mass in breast   . Obesity, unspecified   . Other disorder of menstruation and other abnormal bleeding from female genital tract   . Other speech disturbance(784.59)   . Unspecified essential hypertension   . Unspecified essential hypertension   . Unspecified urinary incontinence   . Urticaria, unspecified     Social History   Social History  . Marital status: Married    Spouse name: N/A  . Number of children: N/A  . Years of education: N/A   Occupational History  . Not on file.   Social History Main Topics  . Smoking status: Never Smoker  . Smokeless tobacco: Never Used  . Alcohol use Yes  . Drug use: No  . Sexual activity: Not on file   Other Topics Concern  . Not on file   Social History Narrative  . No narrative on file    No past surgical history on file.  No family history on file.  Allergies  Allergen Reactions  . Lisinopril     Current Outpatient  Prescriptions on File Prior to Visit  Medication Sig Dispense Refill  . amLODipine (NORVASC) 5 MG tablet Take one-half tablet by  mouth twice a day 90 tablet 3  . Cholecalciferol (VITAMIN D3) 2000 units TABS Take by mouth.    . escitalopram (LEXAPRO) 10 MG tablet Take 1 tablet (10 mg total) by mouth daily. 90 tablet 1  . hydrochlorothiazide (HYDRODIURIL) 12.5 MG tablet TAKE 1 TABLET BY MOUTH  DAILY 90 tablet 2  . albuterol (PROVENTIL HFA;VENTOLIN HFA) 108 (90 Base) MCG/ACT inhaler Inhale 2 puffs into the lungs every 6 (six) hours as needed. (Patient not taking: Reported on 08/04/2017) 8 g 0   No current facility-administered medications on file prior to visit.     BP (!) 146/82 (BP Location: Left Arm)   Temp 98.3 F (36.8 C) (Oral)   Wt 213 lb (96.6 kg)   BMI 33.61 kg/m       Objective:   Physical Exam  Constitutional: She is oriented to person, place, and time. She appears well-developed and well-nourished. No distress.  Musculoskeletal: Normal range of motion. She exhibits edema and tenderness.  Bruising, swelling and pain noted on dorsal aspect of right foot. Small superficial laceration noted   Neurological: She is alert and oriented to person, place, and time.  Skin: Skin is warm and dry. No rash noted. She is not diaphoretic. No  erythema. No pallor.  Psychiatric: She has a normal mood and affect. Her behavior is normal. Judgment and thought content normal.  Vitals reviewed.     Assessment & Plan:  1. Right foot pain - Likely bruise. But will get x ray to r/o fracture do to mechanism of injury  - DG Foot Complete Right; Future - Motrin or tylenol for pain control, ice and elevate   Dorothyann Peng, NP

## 2017-08-31 ENCOUNTER — Other Ambulatory Visit: Payer: Self-pay | Admitting: Family Medicine

## 2017-09-01 ENCOUNTER — Encounter: Payer: Self-pay | Admitting: Adult Health

## 2017-09-01 NOTE — Telephone Encounter (Signed)
Karen Pope pt 

## 2017-09-20 ENCOUNTER — Ambulatory Visit (INDEPENDENT_AMBULATORY_CARE_PROVIDER_SITE_OTHER): Payer: 59 | Admitting: Family Medicine

## 2017-09-20 ENCOUNTER — Encounter: Payer: Self-pay | Admitting: Family Medicine

## 2017-09-20 ENCOUNTER — Ambulatory Visit: Payer: 59 | Admitting: Adult Health

## 2017-09-20 VITALS — BP 138/78 | HR 83 | Temp 98.7°F | Ht 66.75 in | Wt 214.4 lb

## 2017-09-20 DIAGNOSIS — S93505A Unspecified sprain of left lesser toe(s), initial encounter: Secondary | ICD-10-CM

## 2017-09-20 NOTE — Progress Notes (Signed)
Subjective:     Patient ID: Karen Pope, female   DOB: 08-Dec-1961, 55 y.o.   MRN: [id number]  HPI Patient seen with injury to left fifth toe. This occurred around yesterday at 4 AM. She got up and tripped over a box and the floor. She basically hyper abducted the left fifth toe. She had pain immediately afterwards. Was able ambulate but with significant amount of soreness. She's had some mild bruising today. She acquired a walking shoe at a medical supply place and that has helped. Denies any metatarsal pain. No ankle pain. Also tried some icing  Past Medical History:  Diagnosis Date  . Lump or mass in breast   . Obesity, unspecified   . Other disorder of menstruation and other abnormal bleeding from female genital tract   . Other speech disturbance(784.59)   . Unspecified essential hypertension   . Unspecified essential hypertension   . Unspecified urinary incontinence   . Urticaria, unspecified    No past surgical history on file.  reports that she has never smoked. She has never used smokeless tobacco. She reports that she drinks alcohol. She reports that she does not use drugs. family history is not on file. Allergies  Allergen Reactions  . Lisinopril      Review of Systems  Neurological: Negative for weakness and numbness.       Objective:   Physical Exam  Constitutional: She appears well-developed and well-nourished.  Cardiovascular: Normal rate and regular rhythm.   Pulmonary/Chest: Effort normal and breath sounds normal. No respiratory distress. She has no wheezes. She has no rales.  Musculoskeletal:  Patient has some mild bruising on the dorsum of the left foot near the base of the third fourth and fifth toes. She has no significant bony tenderness except for the around the base of the fifth phalanx. No metatarsal tenderness.       Assessment:     Left fifth toe injury. Suspect sprain.    Plan:     -Offered x-ray but explained this would not really change  treatment -Continue walking shoe and buddy taping for comfort measures -Continue icing 15-20 minutes 2-3 times daily as needed -Follow-up as needed  Eulas Post MD [facility]

## 2017-09-20 NOTE — Patient Instructions (Signed)
Continue with ice 15-20 minutes few times daily as needed for swelling. Continue with walking shoe as needed.

## 2017-10-18 ENCOUNTER — Encounter: Payer: Self-pay | Admitting: Adult Health

## 2017-10-18 ENCOUNTER — Ambulatory Visit: Payer: 59 | Admitting: Adult Health

## 2017-10-18 VITALS — BP 136/80 | Temp 98.4°F | Wt 214.0 lb

## 2017-10-18 DIAGNOSIS — N644 Mastodynia: Secondary | ICD-10-CM

## 2017-10-18 NOTE — Progress Notes (Signed)
Subjective:    Patient ID: Karen Pope, female    DOB: Feb 11, 1961, 56 y.o.   MRN: [id number]  HPI 56 year old female who  has a past medical history of Lump or mass in breast, Obesity, unspecified, Other disorder of menstruation and other abnormal bleeding from female genital tract, Other speech disturbance(784.59), Unspecified essential hypertension, Unspecified essential hypertension, Unspecified urinary incontinence, and Urticaria, unspecified.   She presents to the office today with the complaint of pain and mass behind right nipple. She has a history of breast mass but these were found to be fibrotic in nature. She reports that she noticed this mass about 2 weeks ago. She has pain with palpation and this pain is described as " sharp" and "achy" . She denies any drainage or discharge from her breast. Has not noticed and bruising or redness    Review of Systems See HPI   Past Medical History:  Diagnosis Date  . Lump or mass in breast   . Obesity, unspecified   . Other disorder of menstruation and other abnormal bleeding from female genital tract   . Other speech disturbance(784.59)   . Unspecified essential hypertension   . Unspecified essential hypertension   . Unspecified urinary incontinence   . Urticaria, unspecified     Social History   Socioeconomic History  . Marital status: Married    Spouse name: Not on file  . Number of children: Not on file  . Years of education: Not on file  . Highest education level: Not on file  Social Needs  . Financial resource strain: Not on file  . Food insecurity - worry: Not on file  . Food insecurity - inability: Not on file  . Transportation needs - medical: Not on file  . Transportation needs - non-medical: Not on file  Occupational History  . Not on file  Tobacco Use  . Smoking status: Never Smoker  . Smokeless tobacco: Never Used  Substance and Sexual Activity  . Alcohol use: Yes  . Drug use: No  . Sexual activity: Not  on file  Other Topics Concern  . Not on file  Social History Narrative  . Not on file    History reviewed. No pertinent surgical history.  History reviewed. No pertinent family history.  Allergies  Allergen Reactions  . Lisinopril     Current Outpatient Medications on File Prior to Visit  Medication Sig Dispense Refill  . amLODipine (NORVASC) 5 MG tablet TAKE 1/2 TABLET BY MOUTH TWICE DAILY 90 tablet 4  . Cholecalciferol (VITAMIN D3) 2000 units TABS Take by mouth.    . escitalopram (LEXAPRO) 10 MG tablet Take 1 tablet (10 mg total) by mouth daily. 90 tablet 1  . hydrochlorothiazide (HYDRODIURIL) 12.5 MG tablet TAKE 1 TABLET BY MOUTH EVERY DAY 90 tablet 4  . albuterol (PROVENTIL HFA;VENTOLIN HFA) 108 (90 Base) MCG/ACT inhaler Inhale 2 puffs into the lungs every 6 (six) hours as needed. (Patient not taking: Reported on 10/18/2017) 8 g 0   No current facility-administered medications on file prior to visit.     BP 136/80 (BP Location: Left Arm)   Temp 98.4 F (36.9 C) (Oral)   Wt 214 lb (97.1 kg)   BMI 33.77 kg/m       Objective:   Physical Exam  Constitutional: She is oriented to person, place, and time. She appears well-developed and well-nourished. No distress.  Cardiovascular: Normal rate, regular rhythm and normal heart sounds. Exam reveals no gallop and  no friction rub.  No murmur heard. Pulmonary/Chest: Effort normal and breath sounds normal. No respiratory distress. She has no wheezes. She has no rales. She exhibits no tenderness.  Genitourinary:  Genitourinary Comments: Breast mass noted under right nipple. This is more apparent when sitting up. Appears to be felt better at 3 o clock position. She does have fibrotic breasts. No asymmetry noted. No drainage or dimpling.   Neurological: She is alert and oriented to person, place, and time.  Skin: Skin is warm and dry. No rash noted. She is not diaphoretic. No erythema. No pallor.  Psychiatric: She has a normal mood  and affect. Her behavior is normal. Judgment and thought content normal.  Nursing note and vitals reviewed.     Assessment & Plan:  1. Breast pain, right - Likely fibrotic tissue but need to get diagnostic mammogram since this is a new mass  - Will get diagnotic mammogram of both breasts.  - MM Digital Diagnostic Bilat; Future   Dorothyann Peng, NP

## 2017-10-25 DIAGNOSIS — N6320 Unspecified lump in the left breast, unspecified quadrant: Secondary | ICD-10-CM | POA: Diagnosis not present

## 2017-10-25 DIAGNOSIS — N6489 Other specified disorders of breast: Secondary | ICD-10-CM | POA: Diagnosis not present

## 2017-10-25 DIAGNOSIS — N6310 Unspecified lump in the right breast, unspecified quadrant: Secondary | ICD-10-CM | POA: Diagnosis not present

## 2017-10-25 DIAGNOSIS — N6313 Unspecified lump in the right breast, lower outer quadrant: Secondary | ICD-10-CM | POA: Diagnosis not present

## 2017-10-25 DIAGNOSIS — R928 Other abnormal and inconclusive findings on diagnostic imaging of breast: Secondary | ICD-10-CM | POA: Diagnosis not present

## 2017-10-25 LAB — HM MAMMOGRAPHY

## 2017-10-26 ENCOUNTER — Encounter: Payer: Self-pay | Admitting: Adult Health

## 2017-10-27 ENCOUNTER — Telehealth: Payer: Self-pay | Admitting: Family Medicine

## 2017-10-27 NOTE — Telephone Encounter (Signed)
Copied from [place] [id number]. Topic: Referral - Status >> Oct 27, 2017  9:05 AM Arletha Grippe wrote: Reason for [id number] with [facility] called - she is looking for a fax that needs to be signed by [facility] for this pt. Please call her when the form is signed, [phone], Deland Pretty with [facility] >> Oct 27, 2017 11:52 AM Ether Griffins B wrote: Ivin Booty calling again wanting the order signed asap. Pt is wanting an appt for 11/21. Ivin Booty is trying to insure the pt the time slot for that day.  >> Oct 27, 2017  3:28 PM Carolyn Stare wrote:    Pt call to follow up on peprwork that was requested from [facility] in [place]  >> Oct 27, 2017  4:59 PM Bea Graff, NT wrote: Ivin Booty is calling again from [facility] needing this form signed so she can schedule the pt for an appt. CB# [phone] Fax#: [phone]

## 2017-10-30 ENCOUNTER — Other Ambulatory Visit: Payer: Self-pay | Admitting: Adult Health

## 2017-10-30 ENCOUNTER — Encounter: Payer: Self-pay | Admitting: Family Medicine

## 2017-10-30 DIAGNOSIS — N63 Unspecified lump in unspecified breast: Secondary | ICD-10-CM

## 2017-10-30 NOTE — Telephone Encounter (Signed)
Form received and faxed.  Received confirmation the fax was successful.

## 2017-11-01 ENCOUNTER — Ambulatory Visit
Admission: RE | Admit: 2017-11-01 | Discharge: 2017-11-01 | Disposition: A | Discharge status: Home / Self Care | Payer: 59 | Source: Ambulatory Visit | Attending: Adult Health | Admitting: Adult Health

## 2017-11-01 DIAGNOSIS — N6313 Unspecified lump in the right breast, lower outer quadrant: Secondary | ICD-10-CM | POA: Diagnosis not present

## 2017-11-01 DIAGNOSIS — N63 Unspecified lump in unspecified breast: Secondary | ICD-10-CM

## 2017-12-08 ENCOUNTER — Other Ambulatory Visit: Payer: Self-pay | Admitting: Adult Health

## 2017-12-11 NOTE — Telephone Encounter (Signed)
Sent to the pharmacy by e-scribe. 

## 2018-02-16 ENCOUNTER — Encounter: Payer: Self-pay | Admitting: Emergency Medicine

## 2018-02-16 ENCOUNTER — Ambulatory Visit: Payer: 59 | Admitting: Family Medicine

## 2018-02-16 ENCOUNTER — Encounter: Payer: Self-pay | Admitting: Family Medicine

## 2018-02-16 VITALS — BP 140/80 | HR 79 | Temp 98.9°F | Wt 214.2 lb

## 2018-02-16 DIAGNOSIS — T887XXA Unspecified adverse effect of drug or medicament, initial encounter: Secondary | ICD-10-CM

## 2018-02-16 DIAGNOSIS — J0100 Acute maxillary sinusitis, unspecified: Secondary | ICD-10-CM | POA: Insufficient documentation

## 2018-02-16 MED ORDER — AMOXICILLIN 500 MG PO CAPS: 500.0000 mg | ORAL_CAPSULE | Freq: Three times a day (TID) | ORAL | 0 refills | Status: DC

## 2018-02-16 MED ORDER — FLUCONAZOLE 150 MG PO TABS: 150.0000 mg | ORAL_TABLET | Freq: Once | ORAL | 0 refills | Status: AC

## 2018-02-16 NOTE — Patient Instructions (Addendum)

## 2018-02-16 NOTE — Progress Notes (Signed)
Subjective:  Patient ID: Karen Pope, female    DOB: Mar 06, 1961  Age: 57 y.o. MRN: [id number]  CC: Acute Visit   HPI Karen Pope presents for evaluation of a 7-10-day history of URI that has included cough, nasal congestion, postnasal drip, malaise and fatigue.  She has been chilled without fevers.  In the last day or so she has developed facial pressure in her cheekbones with upper gum pain.  She has been using Robitussin cough and cold formula with some benefit.  She has no history of asthma but is wheezed with prior upper respiratory tract infections.  She does not smoke and never has.  She has no history of asthma.  She does not feel wheezy at this time.  Outpatient Medications Prior to Visit  Medication Sig Dispense Refill  . albuterol (PROVENTIL HFA;VENTOLIN HFA) 108 (90 Base) MCG/ACT inhaler Inhale 2 puffs into the lungs every 6 (six) hours as needed. 8 g 0  . amLODipine (NORVASC) 5 MG tablet TAKE 1/2 TABLET BY MOUTH TWICE DAILY 90 tablet 4  . Cholecalciferol (VITAMIN D3) 2000 units TABS Take by mouth.    . escitalopram (LEXAPRO) 10 MG tablet TAKE 1 TABLET BY MOUTH EVERY DAY 90 tablet 0  . hydrochlorothiazide (HYDRODIURIL) 12.5 MG tablet TAKE 1 TABLET BY MOUTH EVERY DAY 90 tablet 4   No facility-administered medications prior to visit.     ROS Review of Systems  Constitutional: Positive for chills and fatigue. Negative for fever.  HENT: Positive for congestion, dental problem, postnasal drip, rhinorrhea, sinus pressure and sinus pain. Negative for ear pain.   Eyes: Negative for photophobia and visual disturbance.  Respiratory: Positive for cough. Negative for shortness of breath and wheezing.   Cardiovascular: Negative.   Gastrointestinal: Negative.   Genitourinary: Negative.   Musculoskeletal: Negative for arthralgias and myalgias.  Skin: Negative for color change and rash.  Allergic/Immunologic: Negative for immunocompromised state.  Neurological: Negative for  weakness and headaches.  Hematological: Does not bruise/bleed easily.  Psychiatric/Behavioral: Negative.     Objective:  BP 140/80 (BP Location: Left Arm, Patient Position: Sitting, Cuff Size: Normal)   Pulse 79   Temp 98.9 F (37.2 C) (Oral)   Wt 214 lb 3.2 oz (97.2 kg)   SpO2 96%   BMI 33.80 kg/m   BP Readings from Last 3 Encounters:  02/16/18 140/80  10/18/17 136/80  09/20/17 138/78    Wt Readings from Last 3 Encounters:  02/16/18 214 lb 3.2 oz (97.2 kg)  10/18/17 214 lb (97.1 kg)  09/20/17 214 lb 6 oz (97.2 kg)    Physical Exam  Constitutional: She is oriented to person, place, and time. She appears well-developed and well-nourished. No distress.  HENT:  Head: Normocephalic and atraumatic.  Right Ear: Tympanic membrane, external ear and ear canal normal.  Left Ear: Tympanic membrane, external ear and ear canal normal.  Mouth/Throat: Oropharynx is clear and moist. No oropharyngeal exudate.  Eyes: Conjunctivae are normal. Pupils are equal, round, and reactive to light. Right eye exhibits no discharge. Left eye exhibits no discharge. No scleral icterus.  Neck: Neck supple. No JVD present. No tracheal deviation present. No thyromegaly present.  Cardiovascular: Normal rate, regular rhythm and normal heart sounds.  Pulmonary/Chest: Effort normal and breath sounds normal. No stridor.  Abdominal: Bowel sounds are normal.  Lymphadenopathy:    She has no cervical adenopathy.  Neurological: She is alert and oriented to person, place, and time.  Skin: Skin is warm and dry. She  is not diaphoretic.  Psychiatric: She has a normal mood and affect.    Lab Results  Component Value Date   WBC 6.1 03/08/2017   HGB 12.5 03/08/2017   HCT 38.9 03/08/2017   PLT 233.0 03/08/2017   GLUCOSE 95 03/08/2017   CHOL 211 (H) 03/08/2017   TRIG 48.0 03/08/2017   HDL 87.10 03/08/2017   LDLDIRECT 94.9 05/09/2007   LDLCALC 114 (H) 03/08/2017   ALT 13 03/08/2017   AST 16 03/08/2017   NA 142  03/08/2017   K 4.5 03/08/2017   CL 105 03/08/2017   CREATININE 0.96 03/08/2017   BUN 19 03/08/2017   CO2 31 03/08/2017   TSH 1.29 03/08/2017   HGBA1C 5.8 08/26/2014    Mm Clip Placement Right  Result Date: 11/01/2017 CLINICAL DATA:  57 year old female status post ultrasound-guided biopsy of a right breast mass. EXAM: DIAGNOSTIC RIGHT MAMMOGRAM POST ULTRASOUND BIOPSY COMPARISON:  Previous exam(s). FINDINGS: Mammographic images were obtained following ultrasound guided biopsy of of a right subareolar mass. A ribbon shaped clip is identified in the slightly inferior subareolar aspect. This is in the expected location status post ultrasound-guided biopsy. IMPRESSION: Ribbon shaped clip in the expected location status post ultrasound-guided biopsy of a right breast mass. Final Assessment: Post Procedure Mammograms for Marker Placement Electronically Signed   By: Kristopher Oppenheim M.D.   On: 11/01/2017 12:43   [place] Rt Breast Bx W Loc Dev 1st Lesion Img Bx Spec US Guide  Addendum Date: 11/07/2017   ADDENDUM REPORT: 11/06/2017 11:18 ADDENDUM: Pathology revealed hyalinized breast tissue in the RIGHT BREAST. This was found to be concordant by Dr. Kristopher Oppenheim. Pathology results were discussed with the patient by telephone. The patient reported doing well after the biopsy with some blistering at the site of the steri strips. Post biopsy instructions and care were reviewed and questions were answered. The patient was encouraged to call [facility] for any additional concerns. The patient was instructed to return for RIGHT diagnostic mammography and RIGHT BREAST ultrasound in 6 months and informed that a reminder letter would be sent regarding this appointment. Pathology results reported by Susa Raring RN, BSN on 11/06/2017. Electronically Signed   By: Kristopher Oppenheim M.D.   On: 11/06/2017 11:18   Result Date: 11/07/2017 CLINICAL DATA:  57 year old female with indeterminate right  breast mass. EXAM: ULTRASOUND GUIDED RIGHT BREAST CORE NEEDLE BIOPSY COMPARISON:  Previous exam(s). FINDINGS: I met with the patient and we discussed the procedure of ultrasound-guided biopsy, including benefits and alternatives. We discussed the high likelihood of a successful procedure. We discussed the risks of the procedure, including infection, bleeding, tissue injury, clip migration, and inadequate sampling. Informed written consent was given. The usual time-out protocol was performed immediately prior to the procedure. Lesion quadrant: Lower outer quadrant. Using sterile technique and 1% Lidocaine as local anesthetic, under direct ultrasound visualization, a 12 gauge spring-loaded device was used to perform biopsy of a right breast mass using a lateral approach. At the conclusion of the procedure a ribbon shaped tissue marker clip was deployed into the biopsy cavity. Follow up 2 view mammogram was performed and dictated separately. IMPRESSION: Ultrasound guided biopsy of an indeterminate right breast mass. No apparent complications. Electronically Signed: By: Kristopher Oppenheim M.D. On: 11/01/2017 12:42    Assessment & Plan:   Seth was seen today for acute visit.  Diagnoses and all orders for this visit:  Acute non-recurrent maxillary sinusitis -     amoxicillin (AMOXIL)  500 MG capsule; Take 1 capsule (500 mg total) by mouth 3 (three) times daily.  Medication side effect -     fluconazole (DIFLUCAN) 150 MG tablet; Take 1 tablet (150 mg total) by mouth once for 1 dose.   I am having Jerene Pitch. Dunivan start on amoxicillin and fluconazole. I am also having her maintain her Vitamin D3, albuterol, hydrochlorothiazide, amLODipine, and escitalopram.  Meds ordered this encounter  Medications  . amoxicillin (AMOXIL) 500 MG capsule    Sig: Take 1 capsule (500 mg total) by mouth 3 (three) times daily.    Dispense:  30 capsule    Refill:  0  . fluconazole (DIFLUCAN) 150 MG tablet    Sig: Take 1  tablet (150 mg total) by mouth once for 1 dose.    Dispense:  1 tablet    Refill:  0     Follow-up: Return if symptoms worsen or fail to improve.  Libby Maw, MD

## 2018-03-13 ENCOUNTER — Other Ambulatory Visit: Payer: Self-pay | Admitting: Adult Health

## 2018-03-13 NOTE — Telephone Encounter (Signed)
Needs annual visit

## 2018-03-14 NOTE — Telephone Encounter (Signed)
Sent to the pharmacy by e-scribe for 90 days.  Pt now scheduled for 03/23/18 for annual.  Instructed to come fasting.

## 2018-03-23 ENCOUNTER — Ambulatory Visit (INDEPENDENT_AMBULATORY_CARE_PROVIDER_SITE_OTHER): Payer: 59 | Admitting: Adult Health

## 2018-03-23 ENCOUNTER — Encounter: Payer: Self-pay | Admitting: Adult Health

## 2018-03-23 VITALS — BP 140/86 | Temp 98.3°F | Ht 66.0 in | Wt 216.0 lb

## 2018-03-23 DIAGNOSIS — E559 Vitamin D deficiency, unspecified: Secondary | ICD-10-CM

## 2018-03-23 DIAGNOSIS — Z0000 Encounter for general adult medical examination without abnormal findings: Secondary | ICD-10-CM | POA: Diagnosis not present

## 2018-03-23 DIAGNOSIS — I10 Essential (primary) hypertension: Secondary | ICD-10-CM

## 2018-03-23 LAB — POCT URINALYSIS DIPSTICK
Bilirubin, UA: NEGATIVE
Blood, UA: NEGATIVE
GLUCOSE UA: NEGATIVE
KETONES UA: NEGATIVE
Nitrite, UA: NEGATIVE
ODOR: NEGATIVE
PROTEIN UA: NEGATIVE
Urobilinogen, UA: 0.2 E.U./dL
pH, UA: 5.5 (ref 5.0–8.0)

## 2018-03-23 LAB — CBC WITH DIFFERENTIAL/PLATELET
BASOS ABS: 0 10*3/uL (ref 0.0–0.1)
Basophils Relative: 0.3 % (ref 0.0–3.0)
EOS ABS: 0.2 10*3/uL (ref 0.0–0.7)
Eosinophils Relative: 3.2 % (ref 0.0–5.0)
HCT: 40.4 % (ref 36.0–46.0)
Hemoglobin: 12.9 g/dL (ref 12.0–15.0)
LYMPHS ABS: 2.2 10*3/uL (ref 0.7–4.0)
Lymphocytes Relative: 33.2 % (ref 12.0–46.0)
MCHC: 32 g/dL (ref 30.0–36.0)
MCV: 82.9 fl (ref 78.0–100.0)
MONO ABS: 0.6 10*3/uL (ref 0.1–1.0)
Monocytes Relative: 8.2 % (ref 3.0–12.0)
NEUTROS PCT: 55.1 % (ref 43.0–77.0)
Neutro Abs: 3.7 10*3/uL (ref 1.4–7.7)
PLATELETS: 237 10*3/uL (ref 150.0–400.0)
RBC: 4.88 Mil/uL (ref 3.87–5.11)
RDW: 16.4 % — ABNORMAL HIGH (ref 11.5–15.5)
WBC: 6.8 10*3/uL (ref 4.0–10.5)

## 2018-03-23 LAB — BASIC METABOLIC PANEL
BUN: 22 mg/dL (ref 6–23)
CO2: 28 meq/L (ref 19–32)
Calcium: 10 mg/dL (ref 8.4–10.5)
Chloride: 106 mEq/L (ref 96–112)
Creatinine, Ser: 1.1 mg/dL (ref 0.40–1.20)
GFR: 65.41 mL/min (ref >60.00)
Glucose, Bld: 92 mg/dL (ref 70–99)
POTASSIUM: 4.3 meq/L (ref 3.5–5.1)
Sodium: 142 mEq/L (ref 135–145)

## 2018-03-23 LAB — LIPID PANEL
CHOL/HDL RATIO: 2
Cholesterol: 200 mg/dL (ref 0–200)
HDL: 82.5 mg/dL (ref >39.00)
LDL CALC: 105 mg/dL — AB (ref 0–99)
NONHDL: 117.36
Triglycerides: 61 mg/dL (ref 0.0–149.0)
VLDL: 12.2 mg/dL (ref 0.0–40.0)

## 2018-03-23 LAB — HEPATIC FUNCTION PANEL
ALK PHOS: 70 U/L (ref 39–117)
ALT: 13 U/L (ref 0–35)
AST: 13 U/L (ref 0–37)
Albumin: 4.2 g/dL (ref 3.5–5.2)
BILIRUBIN DIRECT: 0.1 mg/dL (ref 0.0–0.3)
BILIRUBIN TOTAL: 0.3 mg/dL (ref 0.2–1.2)
Total Protein: 7 g/dL (ref 6.0–8.3)

## 2018-03-23 LAB — TSH: TSH: 1.26 u[IU]/mL (ref 0.35–4.50)

## 2018-03-23 LAB — VITAMIN D 25 HYDROXY (VIT D DEFICIENCY, FRACTURES): VITD: 26.85 ng/mL — ABNORMAL LOW (ref 30.00–100.00)

## 2018-03-23 NOTE — Progress Notes (Signed)
Subjective:    Patient ID: Karen Pope, female    DOB: Jun 07, 1961, 57 y.o.   MRN: [id number]  HPI  Patient presents for yearly preventative medicine examination. She is a pleasant 57 year old female who  has a past medical history of Lump or mass in breast, Obesity, unspecified, Other disorder of menstruation and other abnormal bleeding from female genital tract, Other speech disturbance(784.59), Unspecified essential hypertension, Unspecified essential hypertension, Unspecified urinary incontinence, and Urticaria, unspecified. 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  She is up-to-date on vaccinations  Appropriate screening laboratory values were ordered for the patient including screening of hyperlipidemia, renal function and hepatic function.  Medication reconciliation,  past medical history, social history, problem list and allergies were reviewed in detail with the patient  Goals were established with regard to weight loss, exercise, and  diet in compliance with medications. She is not exercising. Going to see a dietician with her husband   End of life planning was discussed. She does not have an advanced directive or living will.  Wt Readings from Last 3 Encounters:  03/23/18 216 lb (98 kg)  02/16/18 214 lb 3.2 oz (97.2 kg)  10/18/17 214 lb (97.1 kg)     He is up-to-date on health maintenance screening such as colonoscopy, mammogram, Pap smear, vision and dental exams.  She does do self breast exams at home has not noticed any changes in  her breasts   Review of Systems  Constitutional: Negative.   HENT: Negative.   Eyes: Negative.   Respiratory: Negative.   Cardiovascular: Negative.   Gastrointestinal: Negative.   Endocrine: Negative.   Genitourinary: Negative.   Musculoskeletal: Negative.   Skin: Negative.   Allergic/Immunologic: Negative.   Neurological: Negative.   Hematological: Negative.   Psychiatric/Behavioral: Negative.    Past Medical History:  Diagnosis Date  . Lump or mass in breast   . Obesity, unspecified   . Other disorder of menstruation and other abnormal bleeding from female genital tract   . Other speech disturbance(784.59)   . Unspecified essential hypertension   . Unspecified essential hypertension   . Unspecified urinary incontinence   . Urticaria, unspecified     Social History   Socioeconomic History  . Marital status: Married    Spouse name: Not on file  . Number of children: Not on file  . Years of education: Not on file  . Highest education level: Not on file  Occupational History  . Not on file  Social Needs  . Financial resource strain: Not on file  . Food insecurity:    Worry: Not on file    Inability: Not on file  . Transportation needs:    Medical: Not on file    Non-medical: Not on file  Tobacco Use  . Smoking status: Never Smoker  . Smokeless tobacco: Never Used  Substance and Sexual Activity  . Alcohol use: Yes  . Drug use: No  . Sexual activity: Not on file  Lifestyle  . Physical activity:    Days per  week: Not on file    Minutes per session: Not on file  . Stress: Not on file  Relationships  . Social connections:    Talks on phone: Not on file    Gets together: Not on file    Attends religious service: Not on file    Active member of club or organization: Not on file    Attends meetings of clubs or organizations: Not on file    Relationship status: Not on file  . Intimate partner violence:    Fear of current or ex partner: Not on file     Emotionally abused: Not on file    Physically abused: Not on file    Forced sexual activity: Not on file  Other Topics Concern  . Not on file  Social History Narrative  . Not on file    History reviewed. No pertinent surgical history.  History reviewed. No pertinent family history.  Allergies  Allergen Reactions  . Lisinopril     Current Outpatient Medications on File Prior to Visit  Medication Sig Dispense Refill  . albuterol (PROVENTIL HFA;VENTOLIN HFA) 108 (90 Base) MCG/ACT inhaler Inhale 2 puffs into the lungs every 6 (six) hours as needed. 8 g 0  . amLODipine (NORVASC) 5 MG tablet TAKE 1/2 TABLET BY MOUTH TWICE DAILY 90 tablet 4  . Cholecalciferol (VITAMIN D3) 2000 units TABS Take by mouth.    . escitalopram (LEXAPRO) 10 MG tablet TAKE 1 TABLET BY MOUTH EVERY DAY 90 tablet 0  . hydrochlorothiazide (HYDRODIURIL) 12.5 MG tablet TAKE 1 TABLET BY MOUTH EVERY DAY 90 tablet 4   No current facility-administered medications on file prior to visit.     BP 140/86   Temp 98.3 F (36.8 C) (Oral)   Ht 5\' 6"  (1.676 m) Comment: WITHOUT SHOES  Wt 216 lb (98 kg)   BMI 34.86 kg/m      Objective:   Physical Exam  Constitutional: She is oriented to person, place, and time. She appears well-developed and well-nourished. No distress.  HENT:  Head: Normocephalic and atraumatic.  Right Ear: External ear normal.  Left Ear: External ear normal.  Nose: Nose normal.  Mouth/Throat: Oropharynx is clear and moist. No oropharyngeal exudate.  Eyes: Pupils are equal, round, and reactive to light. Conjunctivae and EOM are normal. Right eye exhibits no discharge. Left eye exhibits no discharge. No scleral icterus.  Neck: Normal range of motion. Neck supple. No JVD present. No tracheal deviation present. No thyromegaly present.  Cardiovascular: Normal rate, regular rhythm, normal heart sounds and intact distal pulses. Exam reveals no gallop and no friction rub.  No murmur heard. Pulmonary/Chest:  Effort normal and breath sounds normal. No stridor. No respiratory distress. She has no wheezes. She has no rales. She exhibits no tenderness.  Abdominal: Soft. Bowel sounds are normal. She exhibits no distension and no mass. There is no tenderness. There is no rebound and no guarding.  Genitourinary:  Genitourinary Comments: Will be done by GYN    Musculoskeletal: Normal range of motion. She exhibits edema. She exhibits no tenderness or deformity.  Lymphadenopathy:    She has no cervical adenopathy.  Neurological: She is alert and oriented to person, place, and time. She has normal reflexes. She displays normal reflexes. No cranial nerve deficit. She exhibits normal muscle tone. Coordination normal.  Skin: Skin is warm and dry. No rash noted. She is not diaphoretic. No erythema. No pallor.  Psychiatric: She has a normal mood and affect. Her behavior is  normal. Judgment and thought content normal.  Nursing note and vitals reviewed.     Assessment & Plan:  1. Routine general medical examination at a health care facility - encouraged diet and exercise  - Follow up in one year or sooner if needed - Vitamin D, [id number] - Basic metabolic panel - CBC with Differential/Platelet - Hepatic function panel - Lipid panel - TSH - POC Urinalysis Dipstick  2. Essential hypertension - Increase HCTZ to daily and Norvasc to 5 mg  - Vitamin D, [id number] - Basic metabolic panel - CBC with Differential/Platelet - Hepatic function panel - Lipid panel - TSH  3. Vitamin D deficiency  - Vitamin D, 25-hydroxy   Dorothyann Peng, NP

## 2018-04-19 ENCOUNTER — Other Ambulatory Visit: Payer: Self-pay | Admitting: Adult Health

## 2018-04-19 ENCOUNTER — Encounter: Payer: Self-pay | Admitting: Adult Health

## 2018-04-19 MED ORDER — DOXYCYCLINE HYCLATE 100 MG PO CAPS: 100.0000 mg | ORAL_CAPSULE | Freq: Two times a day (BID) | ORAL | 0 refills | Status: DC

## 2018-06-19 ENCOUNTER — Other Ambulatory Visit: Payer: Self-pay | Admitting: Adult Health

## 2018-06-21 NOTE — Telephone Encounter (Signed)
Sent to the pharmacy by e-scribe. 

## 2018-08-30 DIAGNOSIS — Z23 Encounter for immunization: Secondary | ICD-10-CM | POA: Diagnosis not present

## 2018-10-05 ENCOUNTER — Other Ambulatory Visit: Payer: Self-pay

## 2018-10-05 ENCOUNTER — Encounter (HOSPITAL_COMMUNITY): Payer: Self-pay

## 2018-10-05 ENCOUNTER — Emergency Department (HOSPITAL_COMMUNITY)
Admission: EM | Admit: 2018-10-05 | Discharge: 2018-10-06 | Disposition: A | Discharge status: Home / Self Care | Payer: 59 | Attending: Emergency Medicine | Admitting: Emergency Medicine

## 2018-10-05 DIAGNOSIS — I10 Essential (primary) hypertension: Secondary | ICD-10-CM | POA: Insufficient documentation

## 2018-10-05 DIAGNOSIS — Z79899 Other long term (current) drug therapy: Secondary | ICD-10-CM | POA: Insufficient documentation

## 2018-10-05 DIAGNOSIS — R1011 Right upper quadrant pain: Secondary | ICD-10-CM | POA: Diagnosis not present

## 2018-10-05 DIAGNOSIS — R110 Nausea: Secondary | ICD-10-CM | POA: Diagnosis not present

## 2018-10-05 HISTORY — DX: Unspecified urinary incontinence: R32

## 2018-10-05 LAB — URINALYSIS, ROUTINE W REFLEX MICROSCOPIC
BACTERIA UA: NONE SEEN
BILIRUBIN URINE: NEGATIVE
GLUCOSE, UA: NEGATIVE mg/dL
HGB URINE DIPSTICK: NEGATIVE
KETONES UR: NEGATIVE mg/dL
Nitrite: NEGATIVE
PH: 8 (ref 5.0–8.0)
Protein, ur: NEGATIVE mg/dL
Specific Gravity, Urine: 1.006 (ref 1.005–1.030)

## 2018-10-05 MED ORDER — ONDANSETRON HCL 4 MG/2ML IJ SOLN
4.0000 mg | Freq: Once | INTRAMUSCULAR | Status: AC
  Administered 2018-10-05: 4 mg via INTRAVENOUS
  Filled 2018-10-05: qty 2

## 2018-10-05 MED ORDER — FENTANYL CITRATE (PF) 100 MCG/2ML IJ SOLN
100.0000 ug | Freq: Once | INTRAMUSCULAR | Status: AC
  Administered 2018-10-05: 100 ug via INTRAVENOUS
  Filled 2018-10-05: qty 2

## 2018-10-05 NOTE — ED Notes (Signed)
Attempted to place PIV x2 and sites blew both times. 2ND to attempt to place PIV at this time.

## 2018-10-05 NOTE — ED Provider Notes (Signed)
[facility] DEPT Provider Note: Georgena Spurling, MD, FACEP  CSN: [id number] MRN: [id number] ARRIVAL: 10/05/18 at 2316 ROOM: [place]  Abdominal Pain   HISTORY OF PRESENT ILLNESS  10/05/18 11:26 PM Karen Pope is a 57 y.o. female who developed pain across her upper abdomen about 9:30 PM after eating.  The pain is now localized to the right upper quadrant.  She rates her pain is "greater than a 10", worse with movement, palpation or deep breathing.  She had nausea but no vomiting or diarrhea.  She had a similar episode yesterday evening that was not as severe or as prolonged.  She still has her gallbladder.    Past Medical History:  Diagnosis Date  . essential hypertension   . Lump or mass in breast   . Obesity, unspecified   . Other disorder of menstruation and other abnormal bleeding from female genital tract   . Other speech disturbance(784.59)   . Urinary incontinence   . Urticaria, unspecified     History reviewed. No pertinent surgical history.  History reviewed. No pertinent family history.  Social History   Tobacco Use  . Smoking status: Never Smoker  . Smokeless tobacco: Never Used  Substance Use Topics  . Alcohol use: Yes  . Drug use: No    Prior to Admission medications   Medication Sig Start Date End Date Taking? Authorizing Provider  acetaminophen (TYLENOL) 500 MG tablet Take 1,000 mg by mouth every 6 (six) hours as needed for moderate pain.   Yes [provider]  amLODipine (NORVASC) 5 MG tablet TAKE 1/2 TABLET BY MOUTH TWICE DAILY Patient taking differently: Take 2.5 mg by mouth daily.  09/04/17  Yes Dorena Cookey, MD  Cholecalciferol (VITAMIN D3) 2000 units TABS Take 2,000 Units by mouth every other day.    Yes [provider]  escitalopram (LEXAPRO) 10 MG tablet TAKE 1 TABLET BY MOUTH EVERY DAY Patient taking differently: Take 10 mg by mouth daily.  06/21/18  Yes Nafziger, Tommi Rumps, NP  hydrochlorothiazide  (HYDRODIURIL) 12.5 MG tablet TAKE 1 TABLET BY MOUTH EVERY DAY Patient taking differently: Take 12.5 mg by mouth daily as needed (fluid).  09/04/17  Yes Dorena Cookey, MD  hydroxypropyl methylcellulose / hypromellose (ISOPTO TEARS / GONIOVISC) 2.5 % ophthalmic solution Place 1 drop into both eyes 3 (three) times daily as needed for dry eyes.   Yes [provider]  ibuprofen (ADVIL,MOTRIN) 200 MG tablet Take 200-400 mg by mouth every 6 (six) hours as needed for moderate pain.   Yes [provider]  ranitidine (ZANTAC) 150 MG tablet Take 150 mg by mouth 2 (two) times daily as needed for heartburn.   Yes [provider]  albuterol (PROVENTIL HFA;VENTOLIN HFA) 108 (90 Base) MCG/ACT inhaler Inhale 2 puffs into the lungs every 6 (six) hours as needed. Patient taking differently: Inhale 2 puffs into the lungs every 6 (six) hours as needed for shortness of breath.  03/08/17   Nafziger, Tommi Rumps, NP  doxycycline (VIBRAMYCIN) 100 MG capsule Take 1 capsule (100 mg total) by mouth 2 (two) times daily. Patient not taking: Reported on 10/06/2018 04/19/18   Dorothyann Peng, NP    Allergies Lisinopril and Thimerosal   REVIEW OF SYSTEMS  Negative except as noted here or in the History of Present Illness.   PHYSICAL EXAMINATION  Initial Vital Signs Blood pressure (!) 192/90, pulse 89, temperature 97.8 F (36.6 C), temperature source Oral, resp. rate 20, height 5' 6.5" (1.689  m), weight 93 kg, SpO2 100 %.  Examination General: Well-developed, well-nourished female in no acute distress; appearance consistent with age of record HENT: normocephalic; atraumatic Eyes: pupils equal, round and reactive to light; extraocular muscles intact Neck: supple Heart: regular rate and rhythm Lungs: clear to auscultation bilaterally Abdomen: soft; nondistended; right upper quadrant tenderness; bowel sounds present Extremities: No deformity; full range of motion Neurologic: Awake, alert and oriented;  motor function intact in all extremities and symmetric; no facial droop Skin: Warm and dry Psychiatric: Flat affect   RESULTS  Summary of this visit's results, reviewed by myself:   EKG Interpretation  Date/Time:    Ventricular Rate:    PR Interval:    QRS Duration:   QT Interval:    QTC Calculation:   R Axis:     Text Interpretation:        Laboratory Studies: Results for orders placed or performed during the hospital encounter of 10/05/18 (from the past 24 hour(s))  Urinalysis, Routine w reflex microscopic     Status: Abnormal   Collection Time: 10/05/18 11:35 PM  Result Value Ref Range   Color, Urine STRAW (A) YELLOW   APPearance CLEAR CLEAR   Specific Gravity, Urine 1.006 1.005 - 1.030   pH 8.0 5.0 - 8.0   Glucose, UA NEGATIVE NEGATIVE mg/dL   Hgb urine dipstick NEGATIVE NEGATIVE   Bilirubin Urine NEGATIVE NEGATIVE   Ketones, ur NEGATIVE NEGATIVE mg/dL   Protein, ur NEGATIVE NEGATIVE mg/dL   Nitrite NEGATIVE NEGATIVE   Leukocytes, UA SMALL (A) NEGATIVE   RBC / HPF 0-5 0 - 5 RBC/hpf   WBC, UA 0-5 0 - 5 WBC/hpf   Bacteria, UA NONE SEEN NONE SEEN   Squamous Epithelial / LPF 0-5 0 - 5  Lipase, blood     Status: None   Collection Time: 10/05/18 11:57 PM  Result Value Ref Range   Lipase 33 11 - 51 U/L  Comprehensive metabolic panel     Status: Abnormal   Collection Time: 10/05/18 11:57 PM  Result Value Ref Range   Sodium 140 135 - 145 mmol/L   Potassium 3.4 (L) 3.5 - 5.1 mmol/L   Chloride 107 98 - 111 mmol/L   CO2 24 22 - 32 mmol/L   Glucose, Bld 132 (H) 70 - 99 mg/dL   BUN 18 6 - 20 mg/dL   Creatinine, Ser 1.14 (H) 0.44 - 1.00 mg/dL   Calcium 9.9 8.9 - 10.3 mg/dL   Total Protein 7.6 6.5 - 8.1 g/dL   Albumin 4.2 3.5 - 5.0 g/dL   AST 21 15 - 41 U/L   ALT 16 0 - 44 U/L   Alkaline Phosphatase 71 38 - 126 U/L   Total Bilirubin 0.6 0.3 - 1.2 mg/dL   GFR calc non Af Amer 52 (L) >60 mL/min   GFR calc Af Amer 60 (L) >60 mL/min   Anion gap 9 5 - 15  CBC with  Differential/Platelet     Status: None   Collection Time: 10/05/18 11:57 PM  Result Value Ref Range   WBC 8.1 4.0 - 10.5 K/uL   RBC 4.88 3.87 - 5.11 MIL/uL   Hemoglobin 13.0 12.0 - 15.0 g/dL   HCT 42.3 36.0 - 46.0 %   MCV 86.7 80.0 - 100.0 fL   MCH 26.6 26.0 - 34.0 pg   MCHC 30.7 30.0 - 36.0 g/dL   RDW 15.2 11.5 - 15.5 %   Platelets 230 150 - 400 K/uL  nRBC 0.0 0.0 - 0.2 %   Neutrophils Relative % 52 %   Neutro Abs 4.1 1.7 - 7.7 K/uL   Lymphocytes Relative 36 %   Lymphs Abs 2.9 0.7 - 4.0 K/uL   Monocytes Relative 9 %   Monocytes Absolute 0.8 0.1 - 1.0 K/uL   Eosinophils Relative 3 %   Eosinophils Absolute 0.2 0.0 - 0.5 K/uL   Basophils Relative 0 %   Basophils Absolute 0.0 0.0 - 0.1 K/uL   Immature Granulocytes 0 %   Abs Immature Granulocytes 0.02 0.00 - 0.07 K/uL  I-Stat beta hCG blood, ED     Status: None   Collection Time: 10/06/18 12:09 AM  Result Value Ref Range   I-stat hCG, quantitative <5.0 <5 mIU/mL   Comment 3           Imaging Studies: US Abdomen Complete  Result Date: 10/06/2018 CLINICAL DATA:  57 year old female with right upper quadrant abdominal pain. EXAM: ABDOMEN ULTRASOUND COMPLETE COMPARISON:  None. FINDINGS: Gallbladder: There is small amount of sludge within the gallbladder. The gallbladder wall is within upper limits of normal. Trace pericholecystic fluid may be present. Positive sonographic Murphy's sign reported. Common bile duct: Diameter: 6 mm Liver: Mildly increased liver echogenicity may represent mild fatty infiltration. Portal vein is patent on color Doppler imaging with normal direction of blood flow towards the liver. IVC: No abnormality visualized. Pancreas: Visualized portion unremarkable. Spleen: Size and appearance within normal limits. Right Kidney: Length: 11.5 cm. Echogenicity within normal limits. No mass or hydronephrosis visualized. Left Kidney: Length: 11.9 cm. Echogenicity within normal limits. No mass or hydronephrosis visualized.  Abdominal aorta: No aneurysm visualized. Other findings: None. IMPRESSION: 1. Gallbladder sludge with equivocal findings for acute cholecystitis. A hepatobiliary scintigraphy may provide better evaluation of the gallbladder if there is a high clinical concern for acute cholecystitis . 2. Probable mild fatty liver. Electronically Signed   By: Anner Crete M.D.   On: 10/06/2018 01:21    ED COURSE and MDM  Nursing notes and initial vitals signs, including pulse oximetry, reviewed.  Vitals:   10/05/18 2324 10/06/18 0141  BP: (!) 192/90 (!) 172/85  Pulse: 89 83  Resp: 20 16  Temp: 97.8 F (36.6 C)   TempSrc: Oral   SpO2: 100% 99%  Weight: 93 kg   Height: 5' 6.5" (1.689 m)    1:48 AM Given equivocal findings we will arrange for the patient to have a HIDA scan later today.  Consultation with the [facility] state controlled substances database reveals the patient has received no opioid prescriptions in the past 2 years.  PROCEDURES    ED DIAGNOSES     ICD-10-CM   1. Right upper quadrant abdominal pain R10.11        Jabreel Chimento, Jenny Reichmann, MD 10/06/18 0150

## 2018-10-05 NOTE — ED Notes (Signed)
ED Provider at bedside. 

## 2018-10-05 NOTE — ED Triage Notes (Signed)
Pt reports a sudden onset of RUQ abdominal pain that started at 930p after dinner. States that the pain radiates across her upper abdomen. She is belching and endorses taking a Zantac PTA. Endorses nausea, but denies vomiting or diarrhea. States a similar, less severe, episode happened last night after dinner, but resolved.

## 2018-10-06 ENCOUNTER — Encounter (HOSPITAL_COMMUNITY): Payer: Self-pay

## 2018-10-06 ENCOUNTER — Observation Stay (HOSPITAL_COMMUNITY)
Admission: EM | Admit: 2018-10-06 | Discharge: 2018-10-08 | Disposition: A | Discharge status: Home / Self Care | Payer: 59 | Attending: Surgery | Admitting: Surgery

## 2018-10-06 ENCOUNTER — Emergency Department (HOSPITAL_COMMUNITY): Payer: 59

## 2018-10-06 ENCOUNTER — Other Ambulatory Visit: Payer: Self-pay

## 2018-10-06 ENCOUNTER — Ambulatory Visit (HOSPITAL_COMMUNITY)
Admission: RE | Admit: 2018-10-06 | Discharge: 2018-10-06 | Disposition: A | Discharge status: Home / Self Care | Payer: 59 | Source: Ambulatory Visit | Attending: Emergency Medicine | Admitting: Emergency Medicine

## 2018-10-06 DIAGNOSIS — K8066 Calculus of gallbladder and bile duct with acute and chronic cholecystitis without obstruction: Secondary | ICD-10-CM | POA: Diagnosis not present

## 2018-10-06 DIAGNOSIS — R1011 Right upper quadrant pain: Secondary | ICD-10-CM | POA: Diagnosis present

## 2018-10-06 DIAGNOSIS — K219 Gastro-esophageal reflux disease without esophagitis: Secondary | ICD-10-CM | POA: Diagnosis not present

## 2018-10-06 DIAGNOSIS — E669 Obesity, unspecified: Secondary | ICD-10-CM | POA: Diagnosis not present

## 2018-10-06 DIAGNOSIS — K810 Acute cholecystitis: Secondary | ICD-10-CM | POA: Diagnosis present

## 2018-10-06 DIAGNOSIS — K824 Cholesterolosis of gallbladder: Secondary | ICD-10-CM | POA: Diagnosis not present

## 2018-10-06 DIAGNOSIS — I10 Essential (primary) hypertension: Secondary | ICD-10-CM | POA: Diagnosis not present

## 2018-10-06 DIAGNOSIS — K8012 Calculus of gallbladder with acute and chronic cholecystitis without obstruction: Principal | ICD-10-CM | POA: Insufficient documentation

## 2018-10-06 DIAGNOSIS — Z79899 Other long term (current) drug therapy: Secondary | ICD-10-CM | POA: Insufficient documentation

## 2018-10-06 DIAGNOSIS — K828 Other specified diseases of gallbladder: Secondary | ICD-10-CM | POA: Diagnosis present

## 2018-10-06 DIAGNOSIS — Z6832 Body mass index (BMI) 32.0-32.9, adult: Secondary | ICD-10-CM | POA: Insufficient documentation

## 2018-10-06 DIAGNOSIS — K819 Cholecystitis, unspecified: Secondary | ICD-10-CM

## 2018-10-06 DIAGNOSIS — F329 Major depressive disorder, single episode, unspecified: Secondary | ICD-10-CM | POA: Insufficient documentation

## 2018-10-06 DIAGNOSIS — R69 Illness, unspecified: Secondary | ICD-10-CM | POA: Diagnosis not present

## 2018-10-06 LAB — CBC WITH DIFFERENTIAL/PLATELET
Abs Immature Granulocytes: 0.02 10*3/uL (ref 0.00–0.07)
BASOS ABS: 0 10*3/uL (ref 0.0–0.1)
Basophils Relative: 0 %
EOS PCT: 3 %
Eosinophils Absolute: 0.2 10*3/uL (ref 0.0–0.5)
HEMATOCRIT: 42.3 % (ref 36.0–46.0)
Hemoglobin: 13 g/dL (ref 12.0–15.0)
Immature Granulocytes: 0 %
LYMPHS ABS: 2.9 10*3/uL (ref 0.7–4.0)
Lymphocytes Relative: 36 %
MCH: 26.6 pg (ref 26.0–34.0)
MCHC: 30.7 g/dL (ref 30.0–36.0)
MCV: 86.7 fL (ref 80.0–100.0)
MONO ABS: 0.8 10*3/uL (ref 0.1–1.0)
MONOS PCT: 9 %
NRBC: 0 % (ref 0.0–0.2)
Neutro Abs: 4.1 10*3/uL (ref 1.7–7.7)
Neutrophils Relative %: 52 %
Platelets: 230 10*3/uL (ref 150–400)
RBC: 4.88 MIL/uL (ref 3.87–5.11)
RDW: 15.2 % (ref 11.5–15.5)
WBC: 8.1 10*3/uL (ref 4.0–10.5)

## 2018-10-06 LAB — COMPREHENSIVE METABOLIC PANEL
ALT: 16 U/L (ref 0–44)
AST: 21 U/L (ref 15–41)
Albumin: 4.2 g/dL (ref 3.5–5.0)
Alkaline Phosphatase: 71 U/L (ref 38–126)
Anion gap: 9 (ref 5–15)
BILIRUBIN TOTAL: 0.6 mg/dL (ref 0.3–1.2)
BUN: 18 mg/dL (ref 6–20)
CALCIUM: 9.9 mg/dL (ref 8.9–10.3)
CO2: 24 mmol/L (ref 22–32)
Chloride: 107 mmol/L (ref 98–111)
Creatinine, Ser: 1.14 mg/dL — ABNORMAL HIGH (ref 0.44–1.00)
GFR calc Af Amer: 60 mL/min — ABNORMAL LOW (ref >60)
GFR, EST NON AFRICAN AMERICAN: 52 mL/min — AB (ref >60)
Glucose, Bld: 132 mg/dL — ABNORMAL HIGH (ref 70–99)
POTASSIUM: 3.4 mmol/L — AB (ref 3.5–5.1)
Sodium: 140 mmol/L (ref 135–145)
TOTAL PROTEIN: 7.6 g/dL (ref 6.5–8.1)

## 2018-10-06 LAB — I-STAT BETA HCG BLOOD, ED (MC, WL, AP ONLY)

## 2018-10-06 LAB — LIPASE, BLOOD: Lipase: 33 U/L (ref 11–51)

## 2018-10-06 MED ORDER — ONDANSETRON 4 MG PO TBDP: 4.0000 mg | ORAL_TABLET | Freq: Four times a day (QID) | ORAL | Status: DC | PRN

## 2018-10-06 MED ORDER — OXYCODONE-ACETAMINOPHEN 10-325 MG PO TABS: 0.5000 | ORAL_TABLET | ORAL | 0 refills | Status: DC | PRN

## 2018-10-06 MED ORDER — TECHNETIUM TC 99M MEBROFENIN IV KIT
5.4000 | PACK | Freq: Once | INTRAVENOUS | Status: AC | PRN
  Administered 2018-10-06: 5.4 via INTRAVENOUS

## 2018-10-06 MED ORDER — HYDROCODONE-ACETAMINOPHEN 5-325 MG PO TABS
1.0000 | ORAL_TABLET | ORAL | Status: DC | PRN
  Administered 2018-10-07: 1 via ORAL
  Administered 2018-10-07: 2 via ORAL
  Administered 2018-10-07: 1 via ORAL
  Administered 2018-10-08: 2 via ORAL
  Filled 2018-10-06: qty 1
  Filled 2018-10-06: qty 2
  Filled 2018-10-06: qty 1
  Filled 2018-10-06: qty 2

## 2018-10-06 MED ORDER — MORPHINE SULFATE (PF) 4 MG/ML IV SOLN
INTRAVENOUS | Status: AC
  Filled 2018-10-06: qty 1

## 2018-10-06 MED ORDER — ONDANSETRON HCL 4 MG/2ML IJ SOLN: 4.0000 mg | Freq: Three times a day (TID) | INTRAMUSCULAR | Status: DC | PRN

## 2018-10-06 MED ORDER — KCL IN DEXTROSE-NACL 20-5-0.45 MEQ/L-%-% IV SOLN
INTRAVENOUS | Status: DC
  Administered 2018-10-07 (×3): via INTRAVENOUS
  Filled 2018-10-06 (×3): qty 1000

## 2018-10-06 MED ORDER — ESCITALOPRAM OXALATE 10 MG PO TABS
10.0000 mg | ORAL_TABLET | Freq: Every day | ORAL | Status: DC
  Administered 2018-10-07 – 2018-10-08 (×2): 10 mg via ORAL
  Filled 2018-10-06 (×2): qty 1

## 2018-10-06 MED ORDER — SODIUM CHLORIDE 0.9 % IV SOLN
2.0000 g | Freq: Once | INTRAVENOUS | Status: AC
  Administered 2018-10-06: 2 g via INTRAVENOUS
  Filled 2018-10-06: qty 2

## 2018-10-06 MED ORDER — ONDANSETRON HCL 4 MG/2ML IJ SOLN
INTRAMUSCULAR | Status: AC
  Filled 2018-10-06: qty 2

## 2018-10-06 MED ORDER — SODIUM CHLORIDE 0.9 % IV BOLUS
1000.0000 mL | Freq: Once | INTRAVENOUS | Status: AC
  Administered 2018-10-06: 1000 mL via INTRAVENOUS

## 2018-10-06 MED ORDER — SODIUM CHLORIDE 0.9 % IV SOLN
2.0000 g | INTRAVENOUS | Status: DC
  Administered 2018-10-07: 2 g via INTRAVENOUS
  Filled 2018-10-06: qty 2

## 2018-10-06 MED ORDER — ALBUTEROL SULFATE HFA 108 (90 BASE) MCG/ACT IN AERS: 2.0000 | INHALATION_SPRAY | Freq: Four times a day (QID) | RESPIRATORY_TRACT | Status: DC | PRN

## 2018-10-06 MED ORDER — ACETAMINOPHEN 325 MG PO TABS: 650.0000 mg | ORAL_TABLET | Freq: Four times a day (QID) | ORAL | Status: DC | PRN

## 2018-10-06 MED ORDER — HYDROMORPHONE HCL 1 MG/ML IJ SOLN
1.0000 mg | INTRAMUSCULAR | Status: DC | PRN
  Administered 2018-10-07: 1 mg via INTRAVENOUS
  Filled 2018-10-06: qty 1

## 2018-10-06 MED ORDER — ALBUTEROL SULFATE (2.5 MG/3ML) 0.083% IN NEBU: 2.5000 mg | INHALATION_SOLUTION | Freq: Four times a day (QID) | RESPIRATORY_TRACT | Status: DC | PRN

## 2018-10-06 MED ORDER — FENTANYL CITRATE (PF) 100 MCG/2ML IJ SOLN
100.0000 ug | Freq: Once | INTRAMUSCULAR | Status: AC
  Administered 2018-10-06: 100 ug via INTRAVENOUS
  Filled 2018-10-06: qty 2

## 2018-10-06 MED ORDER — ONDANSETRON 8 MG PO TBDP: 8.0000 mg | ORAL_TABLET | Freq: Three times a day (TID) | ORAL | 0 refills | Status: DC | PRN

## 2018-10-06 MED ORDER — AMLODIPINE BESYLATE 5 MG PO TABS
2.5000 mg | ORAL_TABLET | Freq: Every day | ORAL | Status: DC
  Administered 2018-10-07 – 2018-10-08 (×2): 2.5 mg via ORAL
  Filled 2018-10-06 (×2): qty 1

## 2018-10-06 MED ORDER — MORPHINE SULFATE (PF) 4 MG/ML IV SOLN
4.0000 mg | INTRAVENOUS | Status: DC | PRN
  Administered 2018-10-07: 4 mg via INTRAVENOUS
  Filled 2018-10-06: qty 1

## 2018-10-06 MED ORDER — ONDANSETRON HCL 4 MG/2ML IJ SOLN
4.0000 mg | Freq: Four times a day (QID) | INTRAMUSCULAR | Status: DC | PRN
  Administered 2018-10-07: 4 mg via INTRAVENOUS
  Filled 2018-10-06: qty 2

## 2018-10-06 MED ORDER — ACETAMINOPHEN 650 MG RE SUPP: 650.0000 mg | Freq: Four times a day (QID) | RECTAL | Status: DC | PRN

## 2018-10-06 NOTE — Progress Notes (Signed)
Pt present in [facility] for HIDA scan. Pt given 3.7mg  Morphine IV and 4mg  Zofran IV via verbal order from Dr. Candise Che while in scan. Pt's pain decreased to 2/10 and nausea relieved with medication.

## 2018-10-06 NOTE — ED Provider Notes (Signed)
[facility] DEPT Provider Note   CSN: [id number] Arrival date & time: 10/06/18  1843     History   Chief Complaint Chief Complaint  Patient presents with  . Abnormal Labs    HPI KYNEDI PROFITT is a 57 y.o. female.  The history is provided by the patient and medical records. No language interpreter was used.   CAY KATH is a 57 y.o. female  with a PMH of HTN who presents to the Emergency Department after outpatient HIDA scan was performed today.  In the emergency department for acute onset of right upper quadrant pain which began last night.  Associated with nausea.  Had an equivocal ultrasound, therefore HIDA scan was set up to be done as an outpatient this morning.  She does report persistent right upper quadrant pain, however it is somewhat better than it was last night.  She has not had anything to eat or drink other than a few crackers when she got home and is worse in her pain.  Denies any known fever or chills.   Past Medical History:  Diagnosis Date  . essential hypertension   . Lump or mass in breast   . Obesity, unspecified   . Other disorder of menstruation and other abnormal bleeding from female genital tract   . Other speech disturbance(784.59)   . Urinary incontinence   . Urticaria, unspecified     Patient Active Problem List   Diagnosis Date Noted  . RUQ pain 10/06/2018  . Pelvic pain in female 05/28/2016  . Other malaise and fatigue 08/26/2014  . Urinary incontinence 02/26/2014  . DUB (dysfunctional uterine bleeding) 02/26/2014  . Obesity 02/26/2014  . Breast lump on right side at 10 o'clock position 02/06/2013  . HTN (hypertension) 12/26/2011    History reviewed. No pertinent surgical history.   OB History   None      Home Medications    Prior to Admission medications   Medication Sig Start Date End Date Taking? Authorizing Provider  acetaminophen (TYLENOL) 500 MG tablet Take 1,000 mg by mouth every 6  (six) hours as needed for moderate pain.   Yes [provider]  albuterol (PROVENTIL HFA;VENTOLIN HFA) 108 (90 Base) MCG/ACT inhaler Inhale 2 puffs into the lungs every 6 (six) hours as needed. Patient taking differently: Inhale 2 puffs into the lungs every 6 (six) hours as needed for shortness of breath.  03/08/17  Yes Nafziger, Tommi Rumps, NP  amLODipine (NORVASC) 5 MG tablet TAKE 1/2 TABLET BY MOUTH TWICE DAILY Patient taking differently: Take 2.5 mg by mouth daily.  09/04/17  Yes Dorena Cookey, MD  Cholecalciferol (VITAMIN D3) 2000 units TABS Take 2,000 Units by mouth every other day.    Yes [provider]  escitalopram (LEXAPRO) 10 MG tablet TAKE 1 TABLET BY MOUTH EVERY DAY Patient taking differently: Take 10 mg by mouth daily.  06/21/18  Yes Nafziger, Tommi Rumps, NP  hydrochlorothiazide (HYDRODIURIL) 12.5 MG tablet TAKE 1 TABLET BY MOUTH EVERY DAY Patient taking differently: Take 12.5 mg by mouth daily as needed (fluid).  09/04/17  Yes Dorena Cookey, MD  hydroxypropyl methylcellulose / hypromellose (ISOPTO TEARS / GONIOVISC) 2.5 % ophthalmic solution Place 1 drop into both eyes 3 (three) times daily as needed for dry eyes.   Yes [provider]  ibuprofen (ADVIL,MOTRIN) 200 MG tablet Take 200-400 mg by mouth every 6 (six) hours as needed for moderate pain.   Yes [provider]  ondansetron (ZOFRAN ODT)  8 MG disintegrating tablet Take 1 tablet (8 mg total) by mouth every 8 (eight) hours as needed for nausea or vomiting. 10/06/18  Yes Molpus, John, MD  oxyCODONE-acetaminophen (PERCOCET) 10-325 MG tablet Take 0.5-1 tablets by mouth every 4 (four) hours as needed (for pain). 10/06/18  Yes Molpus, John, MD  ranitidine (ZANTAC) 150 MG tablet Take 150 mg by mouth 2 (two) times daily as needed for heartburn.   Yes [provider]    Family History History reviewed. No pertinent family history.  Social History Social History   Tobacco Use  . Smoking status:  Never Smoker  . Smokeless tobacco: Never Used  Substance Use Topics  . Alcohol use: Yes  . Drug use: No     Allergies   Lisinopril and Thimerosal   Review of Systems Review of Systems  Gastrointestinal: Positive for abdominal pain and nausea.  All other systems reviewed and are negative.    Physical Exam Updated Vital Signs BP 132/69 (BP Location: Left Arm)   Pulse 71   Resp 15   SpO2 94%   Physical Exam  Constitutional: She is oriented to person, place, and time. She appears well-developed and well-nourished. No distress.  HENT:  Head: Normocephalic and atraumatic.  Cardiovascular: Normal rate, regular rhythm and normal heart sounds.  No murmur heard. Pulmonary/Chest: Effort normal and breath sounds normal. No respiratory distress.  Abdominal: Soft. She exhibits no distension. There is tenderness (Right upper quadrant).  Musculoskeletal: She exhibits no edema.  Neurological: She is alert and oriented to person, place, and time.  Skin: Skin is warm and dry.  Nursing note and vitals reviewed.    ED Treatments / Results  Labs (all labs ordered are listed, but only abnormal results are displayed) Labs Reviewed - No data to display  EKG None  Radiology Nm Hepatobiliary Including Gb  Result Date: 10/06/2018 CLINICAL DATA:  Suspected cholecystitis. EXAM: NUCLEAR MEDICINE HEPATOBILIARY IMAGING TECHNIQUE: Sequential images of the abdomen were obtained out to 60 minutes following intravenous administration of radiopharmaceutical. RADIOPHARMACEUTICALS:  5.4 mCi Tc-12m  Choletec IV COMPARISON:  None. FINDINGS: There is prompt uptake of radiotracer within the liver and normal excretion into the small bowel. No filling of the gallbladder seen, despite the administration of morphine. IMPRESSION: Lack of gallbladder filling suggests acute cholecystitis given history. Recommend clinical correlation. Electronically Signed   By: Dorise Bullion III M.D   On: 10/06/2018 18:15   US  Abdomen Complete  Result Date: 10/06/2018 CLINICAL DATA:  57 year old female with right upper quadrant abdominal pain. EXAM: ABDOMEN ULTRASOUND COMPLETE COMPARISON:  None. FINDINGS: Gallbladder: There is small amount of sludge within the gallbladder. The gallbladder wall is within upper limits of normal. Trace pericholecystic fluid may be present. Positive sonographic Murphy's sign reported. Common bile duct: Diameter: 6 mm Liver: Mildly increased liver echogenicity may represent mild fatty infiltration. Portal vein is patent on color Doppler imaging with normal direction of blood flow towards the liver. IVC: No abnormality visualized. Pancreas: Visualized portion unremarkable. Spleen: Size and appearance within normal limits. Right Kidney: Length: 11.5 cm. Echogenicity within normal limits. No mass or hydronephrosis visualized. Left Kidney: Length: 11.9 cm. Echogenicity within normal limits. No mass or hydronephrosis visualized. Abdominal aorta: No aneurysm visualized. Other findings: None. IMPRESSION: 1. Gallbladder sludge with equivocal findings for acute cholecystitis. A hepatobiliary scintigraphy may provide better evaluation of the gallbladder if there is a high clinical concern for acute cholecystitis . 2. Probable mild fatty liver. Electronically Signed   By: Milas Hock  Radparvar M.D.   On: 10/06/2018 01:21    Procedures Procedures (including critical care time)  Medications Ordered in ED Medications  cefTRIAXone (ROCEPHIN) 2 g in sodium chloride 0.9 % 100 mL IVPB (has no administration in time range)  ondansetron (ZOFRAN) injection 4 mg (has no administration in time range)  morphine 4 MG/ML injection 4 mg (has no administration in time range)  sodium chloride 0.9 % bolus 1,000 mL (has no administration in time range)     Initial Impression / Assessment and Plan / ED Course  I have reviewed the triage vital signs and the nursing notes.  Pertinent labs & imaging results that were available  during my care of the patient were reviewed by me and considered in my medical decision making (see chart for details).    DAILEY ALBERSON is a 57 y.o. female who presents to ED after HIDA scan for results showing findings suggestive of acute cholecystitis. She is afebrile. White count last night was normal.  She does have significant tenderness to the right upper quadrant and still having ongoing right upper quadrant pain. Started on Rocephin. Discussed with general surgery, Dr. Harlow Asa, who will admit.      Final Clinical Impressions(s) / ED Diagnoses   Final diagnoses:  RUQ pain    ED Discharge Orders    None       Ward, Ozella Almond, PA-C 10/06/18 1954    Virgel Manifold, MD 10/07/18 1546

## 2018-10-06 NOTE — ED Notes (Signed)
Bed: WA22 Expected date:  Expected time:  Means of arrival:  Comments: 

## 2018-10-06 NOTE — ED Triage Notes (Signed)
Pt returns after ordered scan for admission.

## 2018-10-06 NOTE — H&P (Signed)
Karen Pope is an 57 y.o. female.    General Surgery [facility]  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  She has had nausea and one episode of emesis.  No fever or chills.  Patient presented to ER.  Labs were normal.  Abd USN demonstrated thickened gallbladder wall with sludge.  Nuclear med HIDA scan had non-visualization of the gallbladder consistent with acute cholecystitis.  Patient had persistent pain.  Patient is now admitted to general surgery service for management.  No hx of hepatobiliary or pancreatic disease.  Denies jaundice or acholic stools.  Only prior abdominal surgery was BTL.  Medical hx notable for HTN.  Past Medical History:  Diagnosis Date  . essential hypertension   . Lump or mass in breast   . Obesity, unspecified   . Other disorder of menstruation and other abnormal bleeding from female genital tract   . Other speech disturbance(784.59)   . Urinary incontinence   . Urticaria, unspecified     History reviewed. No pertinent surgical history.  History reviewed. No pertinent family history. Social History:  reports that she has never smoked. She has never used smokeless tobacco. She reports that she drinks alcohol. She reports that she does not use drugs.  Allergies:  Allergies  Allergen Reactions  . Lisinopril Swelling  . Thimerosal Itching    Medications Prior to Admission  Medication Sig Dispense Refill  . acetaminophen (TYLENOL) 500 MG tablet Take 1,000 mg by mouth every 6 (six) hours as needed for moderate pain.    Marland Kitchen albuterol (PROVENTIL HFA;VENTOLIN HFA) 108 (90 Base) MCG/ACT inhaler Inhale 2 puffs into the lungs every 6 (six) hours as needed. (Patient taking differently: Inhale 2 puffs into the lungs every 6 (six) hours as needed for shortness of  breath. ) 8 g 0  . amLODipine (NORVASC) 5 MG tablet TAKE 1/2 TABLET BY MOUTH TWICE DAILY (Patient taking differently: Take 2.5 mg by mouth daily. ) 90 tablet 4  . Cholecalciferol (VITAMIN D3) 2000 units TABS Take 2,000 Units by mouth every other day.     . escitalopram (LEXAPRO) 10 MG tablet TAKE 1 TABLET BY MOUTH EVERY DAY (Patient taking differently: Take 10 mg by mouth daily. ) 90 tablet 2  . hydrochlorothiazide (HYDRODIURIL) 12.5 MG tablet TAKE 1 TABLET BY MOUTH EVERY DAY (Patient taking differently: Take 12.5 mg by mouth daily as needed (fluid). ) 90 tablet 4  . hydroxypropyl methylcellulose / hypromellose (ISOPTO TEARS / GONIOVISC) 2.5 % ophthalmic solution Place 1 drop into both eyes 3 (three) times daily as needed for dry eyes.    Marland Kitchen ibuprofen (ADVIL,MOTRIN) 200 MG tablet Take 200-400 mg by mouth every 6 (six) hours as needed for moderate pain.    Marland Kitchen ondansetron (ZOFRAN ODT) 8 MG disintegrating tablet Take 1 tablet (8 mg total) by mouth every 8 (eight) hours as needed for nausea or vomiting. 10 tablet 0  . oxyCODONE-acetaminophen (PERCOCET) 10-325 MG tablet Take 0.5-1 tablets by mouth every 4 (four) hours as needed (for pain). 12 tablet 0  . ranitidine (ZANTAC) 150 MG tablet Take 150 mg by mouth 2 (two) times daily as needed for heartburn.      Results for orders placed or performed during the hospital encounter of 10/05/18 (from the past 48 hour(s))  Urinalysis, Routine  w reflex microscopic     Status: Abnormal   Collection Time: 10/05/18 11:35 PM  Result Value Ref Range   Color, Urine STRAW (A) YELLOW   APPearance CLEAR CLEAR   Specific Gravity, Urine 1.006 1.005 - 1.030   pH 8.0 5.0 - 8.0   Glucose, UA NEGATIVE NEGATIVE mg/dL   Hgb urine dipstick NEGATIVE NEGATIVE   Bilirubin Urine NEGATIVE NEGATIVE   Ketones, ur NEGATIVE NEGATIVE mg/dL   Protein, ur NEGATIVE NEGATIVE mg/dL   Nitrite NEGATIVE NEGATIVE   Leukocytes, UA SMALL (A) NEGATIVE   RBC / HPF 0-5 0 - 5 RBC/hpf   WBC, UA 0-5  0 - 5 WBC/hpf   Bacteria, UA NONE SEEN NONE SEEN   Squamous Epithelial / LPF 0-5 0 - 5    Comment: Performed at [facility], [place] [address] [place], [place] [address]  Lipase, blood     Status: None   Collection Time: 10/05/18 11:57 PM  Result Value Ref Range   Lipase 33 11 - 51 U/L    Comment: Performed at [facility], [place] [address] [place], [place] [address]  Comprehensive metabolic panel     Status: Abnormal   Collection Time: 10/05/18 11:57 PM  Result Value Ref Range   Sodium 140 135 - 145 mmol/L   Potassium 3.4 (L) 3.5 - 5.1 mmol/L   Chloride 107 98 - 111 mmol/L   CO2 24 22 - 32 mmol/L   Glucose, Bld 132 (H) 70 - 99 mg/dL   BUN 18 6 - 20 mg/dL   Creatinine, Ser 1.14 (H) 0.44 - 1.00 mg/dL   Calcium 9.9 8.9 - 10.3 mg/dL   Total Protein 7.6 6.5 - 8.1 g/dL   Albumin 4.2 3.5 - 5.0 g/dL   AST 21 15 - 41 U/L   ALT 16 0 - 44 U/L   Alkaline Phosphatase 71 38 - 126 U/L   Total Bilirubin 0.6 0.3 - 1.2 mg/dL   GFR calc non Af Amer 52 (L) >60 mL/min   GFR calc Af Amer 60 (L) >60 mL/min    Comment: (NOTE) The eGFR has been calculated using the CKD EPI equation. This calculation has not been validated in all clinical situations. eGFR's persistently <60 mL/min signify possible Chronic Kidney Disease.    Anion gap 9 5 - 15    Comment: Performed at [facility], [place] [address], [place], [place] [address]  CBC with Differential/Platelet     Status: None   Collection Time: 10/05/18 11:57 PM  Result Value Ref Range   WBC 8.1 4.0 - 10.5 K/uL   RBC 4.88 3.87 - 5.11 MIL/uL   Hemoglobin 13.0 12.0 - 15.0 g/dL   HCT 42.3 36.0 - 46.0 %   MCV 86.7 80.0 - 100.0 fL   MCH 26.6 26.0 - 34.0 pg   MCHC 30.7 30.0 - 36.0 g/dL   RDW 15.2 11.5 - 15.5 %   Platelets 230 150 - 400 K/uL   nRBC 0.0 0.0 - 0.2 %   Neutrophils Relative % 52 %   Neutro Abs 4.1 1.7 - 7.7 K/uL   Lymphocytes Relative 36 %   Lymphs Abs 2.9 0.7 - 4.0 K/uL   Monocytes  Relative 9 %   Monocytes Absolute 0.8 0.1 - 1.0 K/uL   Eosinophils Relative 3 %   Eosinophils Absolute 0.2 0.0 - 0.5 K/uL   Basophils Relative 0 %   Basophils Absolute 0.0 0.0 - 0.1 K/uL  Immature Granulocytes 0 %   Abs Immature Granulocytes 0.02 0.00 - 0.07 K/uL    Comment: Performed at [facility], [place] [address], [place], [place] [address]  I-Stat beta hCG blood, ED     Status: None   Collection Time: 10/06/18 12:09 AM  Result Value Ref Range   I-stat hCG, quantitative <5.0 <5 mIU/mL   Comment 3            Comment:   GEST. AGE      CONC.  (mIU/mL)   <=1 WEEK        5 - 50     2 WEEKS       50 - 500     3 WEEKS       100 - 10,000     4 WEEKS     1,000 - 30,000        FEMALE AND NON-PREGNANT FEMALE:     LESS THAN 5 mIU/mL    Nm Hepatobiliary Including Gb  Result Date: 10/06/2018 CLINICAL DATA:  Suspected cholecystitis. EXAM: NUCLEAR MEDICINE HEPATOBILIARY IMAGING TECHNIQUE: Sequential images of the abdomen were obtained out to 60 minutes following intravenous administration of radiopharmaceutical. RADIOPHARMACEUTICALS:  5.4 mCi Tc-6m Choletec IV COMPARISON:  None. FINDINGS: There is prompt uptake of radiotracer within the liver and normal excretion into the small bowel. No filling of the gallbladder seen, despite the administration of morphine. IMPRESSION: Lack of gallbladder filling suggests acute cholecystitis given history. Recommend clinical correlation. Electronically Signed   By: DDorise BullionIII M.D   On: 10/06/2018 18:15   UKoreaAbdomen Complete  Result Date: 10/06/2018 CLINICAL DATA:  57year old female with right upper quadrant abdominal pain. EXAM: ABDOMEN ULTRASOUND COMPLETE COMPARISON:  None. FINDINGS: Gallbladder: There is small amount of sludge within the gallbladder. The gallbladder wall is within upper limits of normal. Trace pericholecystic fluid may be present. Positive sonographic Murphy's sign reported. Common bile duct: Diameter: 6 mm Liver:  Mildly increased liver echogenicity may represent mild fatty infiltration. Portal vein is patent on color Doppler imaging with normal direction of blood flow towards the liver. IVC: No abnormality visualized. Pancreas: Visualized portion unremarkable. Spleen: Size and appearance within normal limits. Right Kidney: Length: 11.5 cm. Echogenicity within normal limits. No mass or hydronephrosis visualized. Left Kidney: Length: 11.9 cm. Echogenicity within normal limits. No mass or hydronephrosis visualized. Abdominal aorta: No aneurysm visualized. Other findings: None. IMPRESSION: 1. Gallbladder sludge with equivocal findings for acute cholecystitis. A hepatobiliary scintigraphy may provide better evaluation of the gallbladder if there is a high clinical concern for acute cholecystitis . 2. Probable mild fatty liver. Electronically Signed   By: AAnner CreteM.D.   On: 10/06/2018 01:21    Review of Systems  Constitutional: Negative for chills, diaphoresis and fever.  HENT: Negative.   Eyes: Negative.   Respiratory: Negative.   Cardiovascular: Negative.   Gastrointestinal: Positive for abdominal pain, constipation, nausea and vomiting.  Genitourinary: Negative.   Musculoskeletal: Positive for back pain.  Skin: Negative.   Neurological: Negative.   Endo/Heme/Allergies: Negative.   Psychiatric/Behavioral: Negative.     Blood pressure (!) 143/73, pulse 62, temperature 98.6 F (37 C), temperature source Oral, resp. rate 15, SpO2 96 %. Physical Exam  Constitutional: She is oriented to person, place, and time. She appears well-developed and well-nourished. No distress.  HENT:  Head: Normocephalic and atraumatic.  Right Ear: External ear normal.  Left Ear: External ear normal.  Mouth/Throat: No oropharyngeal exudate.  Eyes: Pupils  are equal, round, and reactive to light. Conjunctivae are normal. No scleral icterus.  Neck: Normal range of motion. Neck supple. No tracheal deviation present. No  thyromegaly present.  Cardiovascular: Normal rate, regular rhythm and normal heart sounds.  No murmur heard. Respiratory: Effort normal and breath sounds normal. No respiratory distress. She has no wheezes.  GI: Soft. Bowel sounds are normal. She exhibits distension (mild). She exhibits no mass. There is tenderness (diffuse, max in RUQ). There is guarding. There is no rebound.  Musculoskeletal: Normal range of motion. She exhibits no edema or deformity.  Lymphadenopathy:    She has no cervical adenopathy.  Neurological: She is alert and oriented to person, place, and time.  Skin: Skin is warm and dry. She is not diaphoretic.  Psychiatric: She has a normal mood and affect. Her behavior is normal.     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  We discussed proceeding with lap chole with IOC in the AM 10/27 if the OR schedule allows.  Patient understands and wishes to proceed.  The risks and benefits of the procedure have been discussed at length with the patient.  The patient understands the proposed procedure, potential alternative treatments, and the course of recovery to be expected.  All of the patient's questions have been answered at this time.  The patient wishes to proceed with surgery.  Armandina Gemma, [facility] Office: [phone]    Earnstine Regal, MD 10/06/2018, 10:21 PM

## 2018-10-06 NOTE — ED Notes (Signed)
Bed: [facility] Expected date:  Expected time:  Means of arrival:  Comments: For pt in [place]

## 2018-10-07 ENCOUNTER — Encounter (HOSPITAL_COMMUNITY): Admission: EM | Disposition: A | Discharge status: Home / Self Care | Payer: Self-pay | Source: Home / Self Care

## 2018-10-07 ENCOUNTER — Encounter (HOSPITAL_COMMUNITY): Payer: Self-pay | Admitting: Certified Registered Nurse Anesthetist

## 2018-10-07 ENCOUNTER — Observation Stay (HOSPITAL_COMMUNITY): Payer: 59 | Admitting: Certified Registered Nurse Anesthetist

## 2018-10-07 ENCOUNTER — Observation Stay (HOSPITAL_COMMUNITY): Payer: 59

## 2018-10-07 DIAGNOSIS — K8066 Calculus of gallbladder and bile duct with acute and chronic cholecystitis without obstruction: Secondary | ICD-10-CM | POA: Diagnosis not present

## 2018-10-07 DIAGNOSIS — K219 Gastro-esophageal reflux disease without esophagitis: Secondary | ICD-10-CM | POA: Diagnosis not present

## 2018-10-07 DIAGNOSIS — K810 Acute cholecystitis: Secondary | ICD-10-CM | POA: Diagnosis not present

## 2018-10-07 HISTORY — PX: CHOLECYSTECTOMY: SHX55

## 2018-10-07 LAB — SURGICAL PCR SCREEN
MRSA, PCR: NEGATIVE
Staphylococcus aureus: NEGATIVE

## 2018-10-07 SURGERY — LAPAROSCOPIC CHOLECYSTECTOMY WITH INTRAOPERATIVE CHOLANGIOGRAM
Anesthesia: General | Site: Abdomen

## 2018-10-07 MED ORDER — SUGAMMADEX SODIUM 200 MG/2ML IV SOLN
INTRAVENOUS | Status: DC | PRN
  Administered 2018-10-07: 400 mg via INTRAVENOUS

## 2018-10-07 MED ORDER — ROCURONIUM BROMIDE 10 MG/ML (PF) SYRINGE
PREFILLED_SYRINGE | INTRAVENOUS | Status: DC | PRN
  Administered 2018-10-07: 70 mg via INTRAVENOUS

## 2018-10-07 MED ORDER — PHENYLEPHRINE 40 MCG/ML (10ML) SYRINGE FOR IV PUSH (FOR BLOOD PRESSURE SUPPORT)
PREFILLED_SYRINGE | INTRAVENOUS | Status: DC | PRN
  Administered 2018-10-07: 80 ug via INTRAVENOUS
  Administered 2018-10-07: 8 ug via INTRAVENOUS

## 2018-10-07 MED ORDER — FENTANYL CITRATE (PF) 100 MCG/2ML IJ SOLN
INTRAMUSCULAR | Status: AC
  Filled 2018-10-07: qty 2

## 2018-10-07 MED ORDER — EPHEDRINE SULFATE-NACL 50-0.9 MG/10ML-% IV SOSY
PREFILLED_SYRINGE | INTRAVENOUS | Status: DC | PRN
  Administered 2018-10-07: 5 mg via INTRAVENOUS

## 2018-10-07 MED ORDER — BUPIVACAINE HCL (PF) 0.25 % IJ SOLN
INTRAMUSCULAR | Status: DC | PRN
  Administered 2018-10-07: 30 mL

## 2018-10-07 MED ORDER — APREPITANT 40 MG PO CAPS
ORAL_CAPSULE | ORAL | Status: AC
  Filled 2018-10-07: qty 1

## 2018-10-07 MED ORDER — MIDAZOLAM HCL 5 MG/5ML IJ SOLN
INTRAMUSCULAR | Status: DC | PRN
  Administered 2018-10-07: 2 mg via INTRAVENOUS

## 2018-10-07 MED ORDER — PROPOFOL 10 MG/ML IV BOLUS
INTRAVENOUS | Status: AC
  Filled 2018-10-07: qty 20

## 2018-10-07 MED ORDER — SCOPOLAMINE 1 MG/3DAYS TD PT72
MEDICATED_PATCH | TRANSDERMAL | Status: DC | PRN
  Administered 2018-10-07: 1 via TRANSDERMAL

## 2018-10-07 MED ORDER — DEXAMETHASONE SODIUM PHOSPHATE 4 MG/ML IJ SOLN
INTRAMUSCULAR | Status: DC | PRN
  Administered 2018-10-07: 10 mg via INTRAVENOUS

## 2018-10-07 MED ORDER — LACTATED RINGERS IV SOLN
INTRAVENOUS | Status: DC | PRN
  Administered 2018-10-07 (×2): via INTRAVENOUS

## 2018-10-07 MED ORDER — DEXAMETHASONE SODIUM PHOSPHATE 10 MG/ML IJ SOLN
INTRAMUSCULAR | Status: AC
  Filled 2018-10-07: qty 1

## 2018-10-07 MED ORDER — PROPOFOL 10 MG/ML IV BOLUS
INTRAVENOUS | Status: DC | PRN
  Administered 2018-10-07: 120 mg via INTRAVENOUS
  Administered 2018-10-07: 50 mg via INTRAVENOUS

## 2018-10-07 MED ORDER — FENTANYL CITRATE (PF) 250 MCG/5ML IJ SOLN
INTRAMUSCULAR | Status: AC
  Filled 2018-10-07: qty 5

## 2018-10-07 MED ORDER — TRAMADOL HCL 50 MG PO TABS: 50.0000 mg | ORAL_TABLET | Freq: Four times a day (QID) | ORAL | Status: DC | PRN

## 2018-10-07 MED ORDER — LACTATED RINGERS IR SOLN
Status: DC | PRN
  Administered 2018-10-07: 1000 mL

## 2018-10-07 MED ORDER — IOPAMIDOL (ISOVUE-300) INJECTION 61%
INTRAVENOUS | Status: AC
  Filled 2018-10-07: qty 50

## 2018-10-07 MED ORDER — ONDANSETRON HCL 4 MG/2ML IJ SOLN
INTRAMUSCULAR | Status: AC
  Filled 2018-10-07: qty 2

## 2018-10-07 MED ORDER — HYDROMORPHONE HCL 1 MG/ML IJ SOLN: 0.2500 mg | INTRAMUSCULAR | Status: DC | PRN

## 2018-10-07 MED ORDER — LIDOCAINE 2% (20 MG/ML) 5 ML SYRINGE
INTRAMUSCULAR | Status: DC | PRN
  Administered 2018-10-07: 60 mg via INTRAVENOUS

## 2018-10-07 MED ORDER — MIDAZOLAM HCL 2 MG/2ML IJ SOLN
INTRAMUSCULAR | Status: AC
  Filled 2018-10-07: qty 2

## 2018-10-07 MED ORDER — IOPAMIDOL (ISOVUE-300) INJECTION 61%
INTRAVENOUS | Status: DC | PRN
  Administered 2018-10-07: 7 mL

## 2018-10-07 MED ORDER — ONDANSETRON HCL 4 MG/2ML IJ SOLN
INTRAMUSCULAR | Status: DC | PRN
  Administered 2018-10-07: 4 mg via INTRAVENOUS

## 2018-10-07 MED ORDER — 0.9 % SODIUM CHLORIDE (POUR BTL) OPTIME
TOPICAL | Status: DC | PRN
  Administered 2018-10-07: 1000 mL

## 2018-10-07 MED ORDER — SUGAMMADEX SODIUM 500 MG/5ML IV SOLN
INTRAVENOUS | Status: AC
  Filled 2018-10-07: qty 5

## 2018-10-07 MED ORDER — FENTANYL CITRATE (PF) 100 MCG/2ML IJ SOLN
INTRAMUSCULAR | Status: DC | PRN
  Administered 2018-10-07: 100 ug via INTRAVENOUS
  Administered 2018-10-07 (×3): 50 ug via INTRAVENOUS

## 2018-10-07 MED ORDER — SCOPOLAMINE 1 MG/3DAYS TD PT72
MEDICATED_PATCH | TRANSDERMAL | Status: AC
  Filled 2018-10-07: qty 1

## 2018-10-07 MED ORDER — BUPIVACAINE HCL (PF) 0.25 % IJ SOLN
INTRAMUSCULAR | Status: AC
  Filled 2018-10-07: qty 30

## 2018-10-07 SURGICAL SUPPLY — 35 items
APPLIER CLIP ROT 10 11.4 M/L (STAPLE) ×3
BENZOIN TINCTURE PRP APPL 2/3 (GAUZE/BANDAGES/DRESSINGS) ×3 IMPLANT
CABLE HIGH FREQUENCY MONO STRZ (ELECTRODE) ×3 IMPLANT
CHLORAPREP W/TINT 26ML (MISCELLANEOUS) ×6 IMPLANT
CLIP APPLIE ROT 10 11.4 M/L (STAPLE) ×1 IMPLANT
CLOSURE STERI-STRIP 1/2X4 (GAUZE/BANDAGES/DRESSINGS) ×1
CLOSURE WOUND 1/2 X4 (GAUZE/BANDAGES/DRESSINGS) ×1
CLSR STERI-STRIP ANTIMIC 1/2X4 (GAUZE/BANDAGES/DRESSINGS) ×2 IMPLANT
COVER MAYO STAND STRL (DRAPES) ×3 IMPLANT
COVER SURGICAL LIGHT HANDLE (MISCELLANEOUS) ×3 IMPLANT
COVER WAND RF STERILE (DRAPES) ×3 IMPLANT
DECANTER SPIKE VIAL GLASS SM (MISCELLANEOUS) ×3 IMPLANT
DRAPE C-ARM 42X120 X-RAY (DRAPES) ×3 IMPLANT
ELECT REM PT RETURN 15FT ADLT (MISCELLANEOUS) ×3 IMPLANT
GAUZE SPONGE 2X2 8PLY STRL LF (GAUZE/BANDAGES/DRESSINGS) ×1 IMPLANT
GLOVE SURG ORTHO 8.0 STRL STRW (GLOVE) ×3 IMPLANT
GOWN STRL REUS W/TWL XL LVL3 (GOWN DISPOSABLE) ×6 IMPLANT
HEMOSTAT SURGICEL 4X8 (HEMOSTASIS) IMPLANT
KIT BASIN OR (CUSTOM PROCEDURE TRAY) ×3 IMPLANT
POUCH SPECIMEN RETRIEVAL 10MM (ENDOMECHANICALS) ×3 IMPLANT
SCISSORS LAP 5X35 DISP (ENDOMECHANICALS) ×3 IMPLANT
SET CHOLANGIOGRAPH MIX (MISCELLANEOUS) ×3 IMPLANT
SET IRRIG TUBING LAPAROSCOPIC (IRRIGATION / IRRIGATOR) ×3 IMPLANT
SLEEVE XCEL OPT CAN 5 100 (ENDOMECHANICALS) ×3 IMPLANT
SPONGE GAUZE 2X2 STER 10/PKG (GAUZE/BANDAGES/DRESSINGS) ×2
STRIP CLOSURE SKIN 1/2X4 (GAUZE/BANDAGES/DRESSINGS) ×2 IMPLANT
SUT MNCRL AB 4-0 PS2 18 (SUTURE) ×3 IMPLANT
TAPE CLOTH SURG 4X10 WHT LF (GAUZE/BANDAGES/DRESSINGS) ×3 IMPLANT
TOWEL OR 17X26 10 PK STRL BLUE (TOWEL DISPOSABLE) ×3 IMPLANT
TOWEL OR NON WOVEN STRL DISP B (DISPOSABLE) ×3 IMPLANT
TRAY LAPAROSCOPIC (CUSTOM PROCEDURE TRAY) ×3 IMPLANT
TROCAR BLADELESS OPT 5 100 (ENDOMECHANICALS) ×3 IMPLANT
TROCAR XCEL BLUNT TIP 100MML (ENDOMECHANICALS) ×3 IMPLANT
TROCAR XCEL NON-BLD 11X100MML (ENDOMECHANICALS) ×3 IMPLANT
TUBING INSUF HEATED (TUBING) ×3 IMPLANT

## 2018-10-07 NOTE — Anesthesia Preprocedure Evaluation (Addendum)
Anesthesia Evaluation  Patient identified by MRN, date of birth, ID band Patient awake    Reviewed: Allergy & Precautions, NPO status , Patient's Chart, lab work & pertinent test results  Airway Mallampati: I  TM Distance: >3 FB Neck ROM: Full    Dental no notable dental hx. (+) Teeth Intact, Dental Advisory Given   Pulmonary    Pulmonary exam normal breath sounds clear to auscultation       Cardiovascular hypertension, Normal cardiovascular exam Rhythm:Regular Rate:Normal     Neuro/Psych PSYCHIATRIC DISORDERS Depression negative neurological ROS     GI/Hepatic Neg liver ROS, GERD  Medicated,  Endo/Other  negative endocrine ROS  Renal/GU negative Renal ROS  negative genitourinary   Musculoskeletal negative musculoskeletal ROS (+)   Abdominal   Peds  Hematology negative hematology ROS (+)   Anesthesia Other Findings Acute cholecystitis  Reproductive/Obstetrics                            Anesthesia Physical Anesthesia Plan  ASA: II  Anesthesia Plan: General   Post-op Pain Management:    Induction: Intravenous  PONV Risk Score and Plan: 3 and Midazolam, Dexamethasone, Ondansetron and Scopolamine patch - Pre-op  Airway Management Planned: Oral ETT  Additional Equipment:   Intra-op Plan:   Post-operative Plan: Extubation in OR  Informed Consent: I have reviewed the patients History and Physical, chart, labs and discussed the procedure including the risks, benefits and alternatives for the proposed anesthesia with the patient or authorized representative who has indicated his/her understanding and acceptance.   Dental advisory given  Plan Discussed with: CRNA  Anesthesia Plan Comments: ([place] for PONV ppx)       Anesthesia Quick Evaluation

## 2018-10-07 NOTE — Transfer of Care (Signed)
Immediate Anesthesia Transfer of Care Note  Patient: Karen Pope  Procedure(s) Performed: LAPAROSCOPIC CHOLECYSTECTOMY WITH INTRAOPERATIVE CHOLANGIOGRAM (N/A Abdomen)  Patient Location: PACU  Anesthesia Type:General  Level of Consciousness: awake, alert , oriented and patient cooperative  Airway & Oxygen Therapy: Patient Spontanous Breathing and Patient connected to nasal cannula oxygen  Post-op Assessment: Report given to RN and Post -op Vital signs reviewed and stable  Post vital signs: Reviewed and stable  Last Vitals:  Vitals Value Taken Time  BP 133/71 10/07/2018 10:07 AM  Temp    Pulse 88 10/07/2018 10:09 AM  Resp 13 10/07/2018 10:08 AM  SpO2 97 % 10/07/2018 10:09 AM  Vitals shown include unvalidated device data.  Last Pain:  Vitals:   10/07/18 0758  TempSrc: Oral  PainSc: 4       Patients Stated Pain Goal: 2 (38/25/05 3976)  Complications: No apparent anesthesia complications

## 2018-10-07 NOTE — Progress Notes (Signed)
Pt back from Operating room in stable condition. No needs at time of arrival to floor. Pt vs stable and pt refused pain meds for report of 4/10 abdominal pain. Pt surgical wounds wnl.

## 2018-10-07 NOTE — Plan of Care (Signed)
Pt stable this afternoon with no needs. No changes to note after surgery. Pt was able to walk in hall with no complications. Rn medicating for pain and other needs.

## 2018-10-07 NOTE — Progress Notes (Signed)
Pt to pacu in stable condition. No needs or s/s of distress this with assessment. Report given in pacu to [employer].

## 2018-10-07 NOTE — Anesthesia Procedure Notes (Signed)
Procedure Name: Intubation Date/Time: 10/07/2018 8:50 AM Performed by: Claudia Desanctis, CRNA Pre-anesthesia Checklist: Patient identified, Emergency Drugs available, Suction available and Patient being monitored Patient Re-evaluated:Patient Re-evaluated prior to induction Oxygen Delivery Method: Circle system utilized Preoxygenation: Pre-oxygenation with 100% oxygen Induction Type: IV induction Ventilation: Mask ventilation without difficulty Laryngoscope Size: Glidescope and 3 Grade View: Grade I Tube type: Oral Tube size: 7.0 mm Number of attempts: 1 Airway Equipment and Method: Stylet Placement Confirmation: ETT inserted through vocal cords under direct vision,  positive ETCO2 and breath sounds checked- equal and bilateral Secured at: 21 cm Tube secured with: Tape Dental Injury: Teeth and Oropharynx as per pre-operative assessment  Comments: attempted with miller 2 with grade 3 view, changed to Glidescope with still an anterior view, able to pass tube with laryngeal pressure

## 2018-10-07 NOTE — Anesthesia Postprocedure Evaluation (Signed)
Anesthesia Post Note  Patient: Karen Pope  Procedure(s) Performed: LAPAROSCOPIC CHOLECYSTECTOMY WITH INTRAOPERATIVE CHOLANGIOGRAM (N/A Abdomen)     Patient location during evaluation: PACU Anesthesia Type: General Level of consciousness: awake and alert Pain management: pain level controlled Vital Signs Assessment: post-procedure vital signs reviewed and stable Respiratory status: spontaneous breathing, nonlabored ventilation, respiratory function stable and patient connected to nasal cannula oxygen Cardiovascular status: blood pressure returned to baseline and stable Postop Assessment: no apparent nausea or vomiting Anesthetic complications: no    Last Vitals:  Vitals:   10/07/18 1323 10/07/18 1420  BP: (!) 141/62 140/70  Pulse: 62 66  Resp:  18  Temp:  36.8 C  SpO2: 92% 96%    Last Pain:  Vitals:   10/07/18 1809  TempSrc:   PainSc: 2                  Kaleb Sek L Chizara Mena

## 2018-10-07 NOTE — Progress Notes (Signed)
Assessment & Plan: Acute cholecystitis, gallbladder sludge, abdominal pain             Persistent symptoms this AM  Plan to proceed with lap chole this AM  The risks and benefits of the procedure have been discussed at length with the patient.  The patient understands the proposed procedure, potential alternative treatments, and the course of recovery to be expected.  All of the patient's questions have been answered at this time.  The patient wishes to proceed with surgery.        Armandina Gemma, MD       [facility]       Office: [phone]   Chief Complaint: Abdominal pain, cholecystitis  Subjective: Patient with persistent pain, maybe a little worse.  No emesis.  Objective: Vital signs in last 24 hours: Temp:  [98.6 F (37 C)-100 F (37.8 C)] 100 F (37.8 C) (10/27 0531) Pulse Rate:  [62-72] 69 (10/27 0600) Resp:  [13-15] 15 (10/27 0531) BP: (110-143)/(49-73) 110/51 (10/27 0600) SpO2:  [91 %-96 %] 91 % (10/27 0531) Last BM Date: 10/04/18  Intake/Output from previous day: 10/26 0701 - 10/27 0700 In: 1693.7 [P.O.:240; I.V.:419.6; IV Piggyback:1034] Out: 600 [Urine:600] Intake/Output this shift: No intake/output data recorded.  Physical Exam: HEENT - sclerae clear, mucous membranes moist Neck - soft Chest - clear bilaterally Cor - RRR Abdomen - soft, moderate tenderness RUQ with guarding Ext - no edema, non-tender Neuro - alert & oriented, no focal deficits  Lab Results:  Recent Labs    10/05/18 2357  WBC 8.1  HGB 13.0  HCT 42.3  PLT 230   BMET Recent Labs    10/05/18 2357  NA 140  K 3.4*  CL 107  CO2 24  GLUCOSE 132*  BUN 18  CREATININE 1.14*  CALCIUM 9.9   PT/INR No results for input(s): LABPROT, INR in the last 72 hours. Comprehensive Metabolic Panel:    Component Value Date/Time   NA 140 10/05/2018 2357   NA 142 03/23/2018 0811   K 3.4 (L) 10/05/2018 2357   K 4.3 03/23/2018 0811   CL 107 10/05/2018 2357   CL  106 03/23/2018 0811   CO2 24 10/05/2018 2357   CO2 28 03/23/2018 0811   BUN 18 10/05/2018 2357   BUN 22 03/23/2018 0811   CREATININE 1.14 (H) 10/05/2018 2357   CREATININE 1.10 03/23/2018 0811   GLUCOSE 132 (H) 10/05/2018 2357   GLUCOSE 92 03/23/2018 0811   CALCIUM 9.9 10/05/2018 2357   CALCIUM 10.0 03/23/2018 0811   AST 21 10/05/2018 2357   AST 13 03/23/2018 0811   ALT 16 10/05/2018 2357   ALT 13 03/23/2018 0811   ALKPHOS 71 10/05/2018 2357   ALKPHOS 70 03/23/2018 0811   BILITOT 0.6 10/05/2018 2357   BILITOT 0.3 03/23/2018 0811   PROT 7.6 10/05/2018 2357   PROT 7.0 03/23/2018 0811   ALBUMIN 4.2 10/05/2018 2357   ALBUMIN 4.2 03/23/2018 0811    Studies/Results: Nm Hepatobiliary Including Gb  Result Date: 10/06/2018 CLINICAL DATA:  Suspected cholecystitis. EXAM: NUCLEAR MEDICINE HEPATOBILIARY IMAGING TECHNIQUE: Sequential images of the abdomen were obtained out to 60 minutes following intravenous administration of radiopharmaceutical. RADIOPHARMACEUTICALS:  5.4 mCi Tc-41m  Choletec IV COMPARISON:  None. FINDINGS: There is prompt uptake of radiotracer within the liver and normal excretion into the small bowel. No filling of the gallbladder seen, despite the administration of morphine. IMPRESSION: Lack of gallbladder filling suggests acute cholecystitis given history.  Recommend clinical correlation. Electronically Signed   By: Dorise Bullion III M.D   On: 10/06/2018 18:15   US Abdomen Complete  Result Date: 10/06/2018 CLINICAL DATA:  57 year old female with right upper quadrant abdominal pain. EXAM: ABDOMEN ULTRASOUND COMPLETE COMPARISON:  None. FINDINGS: Gallbladder: There is small amount of sludge within the gallbladder. The gallbladder wall is within upper limits of normal. Trace pericholecystic fluid may be present. Positive sonographic Murphy's sign reported. Common bile duct: Diameter: 6 mm Liver: Mildly increased liver echogenicity may represent mild fatty infiltration. Portal  vein is patent on color Doppler imaging with normal direction of blood flow towards the liver. IVC: No abnormality visualized. Pancreas: Visualized portion unremarkable. Spleen: Size and appearance within normal limits. Right Kidney: Length: 11.5 cm. Echogenicity within normal limits. No mass or hydronephrosis visualized. Left Kidney: Length: 11.9 cm. Echogenicity within normal limits. No mass or hydronephrosis visualized. Abdominal aorta: No aneurysm visualized. Other findings: None. IMPRESSION: 1. Gallbladder sludge with equivocal findings for acute cholecystitis. A hepatobiliary scintigraphy may provide better evaluation of the gallbladder if there is a high clinical concern for acute cholecystitis . 2. Probable mild fatty liver. Electronically Signed   By: Anner Crete M.D.   On: 10/06/2018 01:21      [place] M 10/07/2018  Patient ID: Karen Pope, female   DOB: 10-18-1961, 57 y.o.   MRN: [id number]

## 2018-10-07 NOTE — Op Note (Signed)
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  She has had nausea and one episode of emesis.  No fever or chills.  Patient presented to ER.  Labs were normal.  Abd USN demonstrated thickened gallbladder wall with sludge.  Nuclear med HIDA scan had non-visualization of the gallbladder consistent with acute cholecystitis.  Patient had persistent pain.  Patient is now admitted to general surgery service for management.  Procedure Details:  The patient was seen in the pre-op holding area. The risks, benefits, complications, treatment options, and expected outcomes were previously discussed with the patient. The patient agreed with the proposed plan and has signed the informed consent form.  The patient was brought to the Operating Room, identified as Karen Pope and the procedure verified as laparoscopic cholecystectomy with intraoperative cholangiography. A "time out" was completed and the above information confirmed.  The patient was placed in the supine position. Following induction of general anesthesia, the abdomen was prepped and draped in the usual aseptic fashion.  An incision was made in the skin near the umbilicus. The midline fascia was incised and the peritoneal cavity was entered and a Hasson canula was introduced under direct vision.  The Hasson canula was secured with a 0-Vicryl pursestring suture. Pneumoperitoneum was established with carbon dioxide. Additional trocars were introduced under direct vision along  the right costal margin in the midline, mid-clavicular line, and anterior axillary line.   The gallbladder was identified and the fundus grasped and retracted cephalad. There were extensive inflammatory changes present with edema and thickening of the gallbladder wall.  The gallbladder was aspirated with a trocar.  Adhesions were taken down bluntly and the electrocautery was utilized as needed, taking care not to injure any adjacent structures. The infundibulum was grasped and retracted laterally, exposing the peritoneum overlying the triangle of Calot. The peritoneum was incised and structures exposed with blunt dissection. The cystic duct was clearly identified, bluntly dissected circumferentially, and clipped at the neck of the gallbladder.  An incision was made in the cystic duct and the cholangiogram catheter introduced. The catheter was secured using an ligaclip.  Real-time cholangiography was performed using C-arm fluoroscopy.  There was rapid filling of a normal caliber common bile duct.  There was reflux of contrast into the left and right hepatic ductal systems.  There was free flow distally into the duodenum without filling defect or obstruction.  The catheter was removed from the peritoneal cavity.  The cystic duct was then ligated with surgical clips and divided. The cystic artery was identified, dissected circumferentially, ligated with ligaclips, and divided.  Care was taken to avoid injury to the right hepatic artery which was present within the [facility].  The gallbladder was dissected away from the liver bed using the electrocautery for hemostasis. The gallbladder was completely removed from the liver and placed into an endocatch bag. The gallbladder was removed in the endocatch bag through the umbilical port site and submitted to pathology for review.  The right upper quadrant was irrigated  and the gallbladder bed was inspected. Hemostasis was achieved with the  electrocautery.  Pneumoperitoneum was released after viewing removal of the trocars with good hemostasis noted. The umbilical wound was irrigated and the fascia was then closed with the pursestring suture.  Local anesthetic was infiltrated at all port sites. The skin incisions were closed with 4-0 Monocril subcuticular sutures and steri-strips and dressings were applied.  Instrument, sponge, and needle counts were correct at the conclusion of the case.  The patient was awakened from anesthesia and brought to the recovery room in stable condition.  The patient tolerated the procedure well.   Armandina Gemma, MD [facility] Office: [phone]

## 2018-10-08 ENCOUNTER — Encounter (HOSPITAL_COMMUNITY): Payer: Self-pay | Admitting: Surgery

## 2018-10-08 ENCOUNTER — Other Ambulatory Visit: Payer: Self-pay | Admitting: Surgery

## 2018-10-08 DIAGNOSIS — K8012 Calculus of gallbladder with acute and chronic cholecystitis without obstruction: Secondary | ICD-10-CM | POA: Diagnosis not present

## 2018-10-08 NOTE — Discharge Summary (Signed)
[facility] Discharge Summary   Patient ID: Karen Pope MRN: [id number] DOB/AGE: 08/15/1961 57 y.o.  Admit date: 10/06/2018 Discharge date: 10/08/2018  Admitting Diagnosis: Acute cholecystitits  Discharge Diagnosis Patient Active Problem List   Diagnosis Date Noted  . Acute cholecystitis s/p lap cholecystectomy 10/07/2018 10/06/2018  . Pelvic pain in female 05/28/2016  . Other malaise and fatigue 08/26/2014  . Urinary incontinence 02/26/2014  . DUB (dysfunctional uterine bleeding) 02/26/2014  . Obesity 02/26/2014  . Breast lump on right side at 10 o'clock position 02/06/2013  . HTN (hypertension) 12/26/2011    Consultants None  Imaging: Dg Cholangiogram Operative  Result Date: 10/07/2018 CLINICAL DATA:  Intraoperative cholangiogram during laparoscopic cholecystectomy. EXAM: INTRAOPERATIVE CHOLANGIOGRAM FLUOROSCOPY TIME:  12 seconds (4.3 mGy) COMPARISON:  Nuclear medicine HIDA scan-10/06/2018; abdominal ultrasound-10/06/2018 FINDINGS: Intraoperative cholangiographic images of the right upper abdominal quadrant during laparoscopic cholecystectomy are provided for review. Surgical clips overlie the expected location of the gallbladder fossa. Contrast injection demonstrates selective cannulation of the central aspect of the cystic duct. There is passage of contrast through the central aspect of the cystic duct with filling of a non dilated common bile duct. There is passage of contrast though the CBD and into the descending portion of the duodenum. There is minimal reflux of injected contrast into the common hepatic duct and central aspect of the non dilated intrahepatic biliary system. There is reflux of contrast into the pancreatic duct which appears nondilated. There are no discrete filling defects within the opacified portions of the biliary system to suggest the presence of choledocholithiasis. IMPRESSION: No evidence of choledocholithiasis. Electronically Signed    By: Sandi Mariscal M.D.   On: 10/07/2018 09:53   Nm Hepatobiliary Including Gb  Result Date: 10/06/2018 CLINICAL DATA:  Suspected cholecystitis. EXAM: NUCLEAR MEDICINE HEPATOBILIARY IMAGING TECHNIQUE: Sequential images of the abdomen were obtained out to 60 minutes following intravenous administration of radiopharmaceutical. RADIOPHARMACEUTICALS:  5.4 mCi Tc-40m  Choletec IV COMPARISON:  None. FINDINGS: There is prompt uptake of radiotracer within the liver and normal excretion into the small bowel. No filling of the gallbladder seen, despite the administration of morphine. IMPRESSION: Lack of gallbladder filling suggests acute cholecystitis given history. Recommend clinical correlation. Electronically Signed   By: Dorise Bullion III M.D   On: 10/06/2018 18:15    Procedures Dr. Harlow Asa (10/07/18) - Laparoscopic Cholecystectomy with [place] Hospital Course:  Karen Pope is a 57yo female who [facility] to [facility]/26 with 2 days of RUQ pain.  Abd USN demonstrated thickened gallbladder wall with sludge.  Nuclear med HIDA scan had non-visualization of the gallbladder consistent with acute cholecystitis.  Patient was admitted and underwent procedure listed above.  Tolerated procedure well and was transferred to the floor.  Diet was advanced as tolerated.  On POD1 the patient was voiding well, tolerating diet, ambulating well, pain well controlled, vital signs stable, incisions c/d/i and felt stable for discharge home.  Patient will follow up as below and knows to call with questions or concerns.     Physical Exam: General:  Alert, NAD, pleasant, comfortable Pulm: effort normal Abd:  Soft, ND, appropriately tender, multiple lap incisions with C/D/I dressings  Allergies as of 10/08/2018      Reactions   Lisinopril Swelling   Thimerosal Itching      Medication List    TAKE these medications   acetaminophen 500 MG tablet Commonly known as:  TYLENOL Take 1,000 mg by mouth every 6 (six) hours as  needed for  moderate pain.   albuterol 108 (90 Base) MCG/ACT inhaler Commonly known as:  PROVENTIL HFA;VENTOLIN HFA Inhale 2 puffs into the lungs every 6 (six) hours as needed. What changed:  reasons to take this   amLODipine 5 MG tablet Commonly known as:  NORVASC TAKE 1/2 TABLET BY MOUTH TWICE DAILY What changed:  when to take this   escitalopram 10 MG tablet Commonly known as:  LEXAPRO TAKE 1 TABLET BY MOUTH EVERY DAY   hydrochlorothiazide 12.5 MG tablet Commonly known as:  HYDRODIURIL TAKE 1 TABLET BY MOUTH EVERY DAY What changed:    when to take this  reasons to take this   hydroxypropyl methylcellulose / hypromellose 2.5 % ophthalmic solution Commonly known as:  ISOPTO TEARS / GONIOVISC Place 1 drop into both eyes 3 (three) times daily as needed for dry eyes.   ibuprofen 200 MG tablet Commonly known as:  ADVIL,MOTRIN Take 200-400 mg by mouth every 6 (six) hours as needed for moderate pain.   ondansetron 8 MG disintegrating tablet Commonly known as:  ZOFRAN-ODT Take 1 tablet (8 mg total) by mouth every 8 (eight) hours as needed for nausea or vomiting.   oxyCODONE-acetaminophen 10-325 MG tablet Commonly known as:  PERCOCET Take 0.5-1 tablets by mouth every 4 (four) hours as needed (for pain).   ranitidine 150 MG tablet Commonly known as:  ZANTAC Take 150 mg by mouth 2 (two) times daily as needed for heartburn.   Vitamin D3 2000 units Tabs Take 2,000 Units by mouth every other day.        Follow-up [facility], [place]. Call.   Specialty:  General Surgery Why:  We are working on your appointment, please call to confirm. Please arrive 30 minutes prior to your appointment to check in and fill out paperwork. Bring photo ID and insurance information. Contact information: [address] [place] [phone]          Signed: Wellington Hampshire, [facility] 10/08/2018,  8:50 AM Pager: [phone] Mon 7:00 am -11:30 AM Tues-Fri 7:00 am-4:30 pm Sat-Sun 7:00 am-11:30 am

## 2018-10-08 NOTE — Discharge Instructions (Signed)
[facility] ° °Please arrive at least 30 min before your appointment to complete your check in paperwork.  If you are unable to arrive 30 min prior to your appointment time we may have to cancel or reschedule you. °LAPAROSCOPIC SURGERY: POST OP INSTRUCTIONS °Always review your discharge instruction sheet given to you by the facility where your surgery was performed. °IF YOU HAVE DISABILITY OR FAMILY LEAVE FORMS, YOU MUST BRING THEM TO THE OFFICE FOR PROCESSING.   °DO NOT GIVE THEM TO YOUR DOCTOR. ° °PAIN CONTROL ° °1. First take acetaminophen (Tylenol) AND/or ibuprofen (Advil) to control your pain after surgery.  Follow directions on package.  Taking acetaminophen (Tylenol) and/or ibuprofen (Advil) regularly after surgery will help to control your pain and lower the amount of prescription pain medication you may need.  You should not take more than 4,000 mg (4 grams) of acetaminophen (Tylenol) in 24 hours.  You should not take ibuprofen (Advil), aleve, motrin, naprosyn or other NSAIDS if you have a history of stomach ulcers or chronic kidney disease.  °2. A prescription for pain medication may be given to you upon discharge.  Take your pain medication as prescribed, if you still have uncontrolled pain after taking acetaminophen (Tylenol) or ibuprofen (Advil). °3. Use ice packs to help control pain. °4. If you need a refill on your pain medication, please contact your pharmacy.  They will contact our office to request authorization. Prescriptions will not be filled after 5pm or on week-ends. ° °HOME MEDICATIONS °5. Take your usually prescribed medications unless otherwise directed. ° °DIET °6. You should follow a light diet the first few days after arrival home.  Be sure to include lots of fluids daily. Avoid fatty, fried foods.  ° °CONSTIPATION °7. It is common to experience some constipation after surgery and if you are taking pain medication.  Increasing fluid intake and taking a stool  softener (such as Colace) will usually help or prevent this problem from occurring.  A mild laxative (Milk of Magnesia or Miralax) should be taken according to package instructions if there are no bowel movements after 48 hours. ° °WOUND/INCISION CARE °8. Most patients will experience some swelling and bruising in the area of the incisions.  Ice packs will help.  Swelling and bruising can take several days to resolve.  °9. Unless discharge instructions indicate otherwise, follow guidelines below  °a. STERI-STRIPS - you may remove your outer bandages 48 hours after surgery, and you may shower at that time.  You have steri-strips (small skin tapes) in place directly over the incision.  These strips should be left on the skin for 7-10 days.   °b. DERMABOND/SKIN GLUE - you may shower in 24 hours.  The glue will flake off over the next 2-3 weeks. °10. Any sutures or staples will be removed at the office during your follow-up visit. ° °ACTIVITIES °11. You may resume regular (light) daily activities beginning the next day--such as daily self-care, walking, climbing stairs--gradually increasing activities as tolerated.  You may have sexual intercourse when it is comfortable.  Refrain from any heavy lifting or straining until approved by your doctor. °a. You may drive when you are no longer taking prescription pain medication, you can comfortably wear a seatbelt, and you can safely maneuver your car and apply brakes. ° °FOLLOW-UP °12. You should see your doctor in the office for a follow-up appointment approximately 2-3 weeks after your surgery.  You should have been given your post-op/follow-up appointment when   your surgery was scheduled.  If you did not receive a post-op/follow-up appointment, make sure that you call for this appointment within a day or two after you arrive home to insure a convenient appointment time. ° °OTHER INSTRUCTIONS ° °WHEN TO CALL YOUR DOCTOR: °1. Fever over 101.0 °2. Inability to  urinate °3. Continued bleeding from incision. °4. Increased pain, redness, or drainage from the incision. °5. Increasing abdominal pain ° °The clinic staff is available to answer your questions during regular business hours.  Please don’t hesitate to call and ask to speak to one of the nurses for clinical concerns.  If you have a medical emergency, go to the nearest emergency room or call 911.  A surgeon from [facility] is always on call at the hospital. °[address], [address], [place], [place]  [address] ? [address] [address], [place], [place]   [address] °[phone] ? [phone] ? FAX [phone] ° ° ° °

## 2018-10-08 NOTE — Progress Notes (Signed)
Patient verbalized understanding of discharge instructions. Patient is stable at discharge. Patient was given work note and AVS summary. Patient's daughter at bedside.

## 2018-10-23 ENCOUNTER — Other Ambulatory Visit: Payer: Self-pay | Admitting: Adult Health

## 2018-10-23 DIAGNOSIS — N63 Unspecified lump in unspecified breast: Secondary | ICD-10-CM

## 2018-10-24 ENCOUNTER — Ambulatory Visit: Payer: 59 | Admitting: Adult Health

## 2018-10-25 ENCOUNTER — Encounter: Payer: Self-pay | Admitting: Adult Health

## 2018-10-25 ENCOUNTER — Ambulatory Visit (INDEPENDENT_AMBULATORY_CARE_PROVIDER_SITE_OTHER): Payer: 59 | Admitting: Adult Health

## 2018-10-25 VITALS — BP 140/90 | Temp 98.2°F | Wt 217.0 lb

## 2018-10-25 DIAGNOSIS — T148XXA Other injury of unspecified body region, initial encounter: Secondary | ICD-10-CM | POA: Diagnosis not present

## 2018-10-25 MED ORDER — CYCLOBENZAPRINE HCL 10 MG PO TABS: 10.0000 mg | ORAL_TABLET | Freq: Every day | ORAL | 0 refills | Status: DC

## 2018-10-25 MED ORDER — METHYLPREDNISOLONE 4 MG PO TBPK: ORAL_TABLET | ORAL | 0 refills | Status: DC

## 2018-10-25 NOTE — Progress Notes (Signed)
Subjective:    Patient ID: Karen Pope, female    DOB: 1961-01-11, 57 y.o.   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  She presents to the office today for an acute issue of left sided neck pain and stiffness. Pain has been present for approx 2 days. She had the same pain a week prior and it resolved after some time with motrin. Since pain has returned she has been able to rotate her head towards the left. Pain radiates into the scalp. Pain is felt as a " hot poker".   She has continued with Motrin but has not noticed any improvement     Review of Systems See HPI   Past Medical History:  Diagnosis Date  . essential hypertension   . Lump or mass in breast   . Obesity, unspecified   . Other disorder of menstruation and other abnormal bleeding from female genital tract   . Other speech disturbance(784.59)   . Urinary incontinence   . Urticaria, unspecified     Social History   Socioeconomic History  . Marital status: Married    Spouse name: Not on file  . Number of children: Not on file  . Years of education: Not on file  . Highest education level: Not on file  Occupational History  . Not on file  Social Needs  . Financial resource strain: Not on file  . Food insecurity:    Worry: Not on file    Inability: Not on file  . Transportation needs:    Medical: Not on file    Non-medical: Not on file  Tobacco Use  . Smoking status: Never Smoker  . Smokeless tobacco: Never Used  Substance and Sexual Activity  . Alcohol use: Yes  . Drug use: No  . Sexual activity: Not on file  Lifestyle  . Physical activity:    Days per week: Not on file    Minutes per session: Not on file  . Stress: Not on file    Relationships  . Social connections:    Talks on phone: Not on file    Gets together: Not on file    Attends religious service: Not on file    Active member of club or organization: Not on file    Attends meetings of clubs or organizations: Not on file    Relationship status: Not on file  . Intimate partner violence:    Fear of current or ex partner: Not on file    Emotionally abused: Not on file    Physically abused: Not on file    Forced sexual activity: Not on file  Other Topics Concern  . Not on file  Social History Narrative  . Not on file    Past Surgical History:  Procedure Laterality Date  . CHOLECYSTECTOMY N/A 10/07/2018   Procedure: LAPAROSCOPIC CHOLECYSTECTOMY WITH INTRAOPERATIVE CHOLANGIOGRAM;  Surgeon: Armandina Gemma, MD;  Location: [facility];  Service: General;  Laterality: N/A;    History reviewed. No pertinent family history.  Allergies  Allergen Reactions  . Lisinopril Swelling  . Thimerosal Itching    Current Outpatient Medications on File Prior to Visit  Medication Sig Dispense Refill  . acetaminophen (TYLENOL) 500 MG tablet Take 1,000 mg by mouth every 6 (six) hours  as needed for moderate pain.    Marland Kitchen albuterol (PROVENTIL HFA;VENTOLIN HFA) 108 (90 Base) MCG/ACT inhaler Inhale 2 puffs into the lungs every 6 (six) hours as needed. (Patient taking differently: Inhale 2 puffs into the lungs every 6 (six) hours as needed for shortness of breath. ) 8 g 0  . amLODipine (NORVASC) 5 MG tablet TAKE 1/2 TABLET BY MOUTH TWICE DAILY (Patient taking differently: Take 2.5 mg by mouth daily. ) 90 tablet 4  . Cholecalciferol (VITAMIN D3) 2000 units TABS Take 2,000 Units by mouth every other day.     . escitalopram (LEXAPRO) 10 MG tablet TAKE 1 TABLET BY MOUTH EVERY DAY (Patient taking differently: Take 10 mg by mouth daily. ) 90 tablet 2  . hydrochlorothiazide (HYDRODIURIL) 12.5 MG tablet TAKE 1 TABLET BY MOUTH EVERY DAY (Patient taking differently: Take 12.5 mg by mouth daily  as needed (fluid). ) 90 tablet 4  . hydroxypropyl methylcellulose / hypromellose (ISOPTO TEARS / GONIOVISC) 2.5 % ophthalmic solution Place 1 drop into both eyes 3 (three) times daily as needed for dry eyes.    Marland Kitchen ibuprofen (ADVIL,MOTRIN) 200 MG tablet Take 200-400 mg by mouth every 6 (six) hours as needed for moderate pain.     No current facility-administered medications on file prior to visit.     BP 140/90   Temp 98.2 F (36.8 C)   Wt 217 lb (98.4 kg)   BMI 34.50 kg/m       Objective:   Physical Exam  Constitutional: She is oriented to person, place, and time. She appears well-developed and well-nourished.  Cardiovascular: Normal rate, regular rhythm, normal heart sounds and intact distal pulses.  Pulmonary/Chest: Effort normal and breath sounds normal.  Musculoskeletal: She exhibits tenderness (left trapazoid ).  Neurological: She is alert and oriented to person, place, and time.  Skin: Skin is warm and dry. Capillary refill takes less than 2 seconds. She is not diaphoretic.  Psychiatric: She has a normal mood and affect. Her behavior is normal. Judgment and thought content normal.  Nursing note and vitals reviewed.     Assessment & Plan:  .1. Muscle strain - warm compress. Follow up if no improvement in the next 2-3 days - methylPREDNISolone (MEDROL DOSEPAK) 4 MG TBPK tablet; Take as directed  Dispense: 21 tablet; Refill: 0 - cyclobenzaprine (FLEXERIL) 10 MG tablet; Take 1 tablet (10 mg total) by mouth at bedtime.  Dispense: 10 tablet; Refill: 0  Dorothyann Peng, NP

## 2018-10-30 ENCOUNTER — Ambulatory Visit
Admission: RE | Admit: 2018-10-30 | Discharge: 2018-10-30 | Disposition: A | Discharge status: Home / Self Care | Payer: 59 | Source: Ambulatory Visit | Attending: Adult Health | Admitting: Adult Health

## 2018-10-30 DIAGNOSIS — N63 Unspecified lump in unspecified breast: Secondary | ICD-10-CM

## 2018-10-30 DIAGNOSIS — N6489 Other specified disorders of breast: Secondary | ICD-10-CM | POA: Diagnosis not present

## 2018-10-30 DIAGNOSIS — R928 Other abnormal and inconclusive findings on diagnostic imaging of breast: Secondary | ICD-10-CM | POA: Diagnosis not present

## 2019-02-20 ENCOUNTER — Other Ambulatory Visit: Payer: Self-pay | Admitting: Family Medicine

## 2019-02-20 MED ORDER — AMLODIPINE BESYLATE 5 MG PO TABS: 2.5000 mg | ORAL_TABLET | Freq: Two times a day (BID) | ORAL | 0 refills | Status: DC

## 2019-02-20 MED ORDER — HYDROCHLOROTHIAZIDE 12.5 MG PO TABS: 12.5000 mg | ORAL_TABLET | Freq: Every day | ORAL | 0 refills | Status: DC

## 2019-03-19 ENCOUNTER — Ambulatory Visit (INDEPENDENT_AMBULATORY_CARE_PROVIDER_SITE_OTHER): Payer: 59 | Admitting: Family Medicine

## 2019-03-19 ENCOUNTER — Encounter: Payer: Self-pay | Admitting: Family Medicine

## 2019-03-19 ENCOUNTER — Other Ambulatory Visit: Payer: Self-pay

## 2019-03-19 DIAGNOSIS — Z20828 Contact with and (suspected) exposure to other viral communicable diseases: Secondary | ICD-10-CM | POA: Diagnosis not present

## 2019-03-19 DIAGNOSIS — R070 Pain in throat: Secondary | ICD-10-CM | POA: Diagnosis not present

## 2019-03-19 DIAGNOSIS — R509 Fever, unspecified: Secondary | ICD-10-CM | POA: Diagnosis not present

## 2019-03-19 DIAGNOSIS — B349 Viral infection, unspecified: Secondary | ICD-10-CM | POA: Diagnosis not present

## 2019-03-19 DIAGNOSIS — R05 Cough: Secondary | ICD-10-CM | POA: Diagnosis not present

## 2019-03-19 NOTE — Progress Notes (Signed)
Subjective:    Patient ID: Karen Pope, female    DOB: 1961-06-16, 58 y.o.   MRN: [id number]  HPI Virtual Visit via Video Note  I connected with the patient on 03/19/19 at  3:15 PM EDT by a video enabled telemedicine application and verified that I am speaking with the correct person using two identifiers.  Location patient: home Location provider:work or home office Persons participating in the virtual visit: patient, provider  I discussed the limitations of evaluation and management by telemedicine and the availability of in person appointments. The patient expressed understanding and agreed to proceed.   HPI: She wants to discuss some symptoms that began last night including fever to 101.7 degrees, ST, chills, body aches,and a dry cough. No chest pain or SOB. No NVD. She is drinking fluids and taking Tylenol. 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 We know that staff and residents there have tested positive for the Covid19 virus. Earnestine's boss told her to go home and to quarantine until she can get tested for the virus.    ROS: See pertinent positives and negatives per HPI.  Past Medical History:  Diagnosis Date  . essential hypertension   . Lump or mass in breast   . Obesity, unspecified   . Other disorder of menstruation and other abnormal bleeding from female genital tract   . Other speech disturbance(784.59)   . Urinary incontinence   . Urticaria, unspecified     Past Surgical History:  Procedure Laterality Date  . CHOLECYSTECTOMY N/A 10/07/2018   Procedure: LAPAROSCOPIC CHOLECYSTECTOMY WITH INTRAOPERATIVE CHOLANGIOGRAM;  Surgeon: Armandina Gemma, MD;  Location: [facility];  Service: General;  Laterality: N/A;    History reviewed. No pertinent family history.   Current Outpatient Medications:  .  acetaminophen (TYLENOL) 500 MG tablet, Take 1,000 mg by mouth every 6  (six) hours as needed for moderate pain., Disp: , Rfl:  .  albuterol (PROVENTIL HFA;VENTOLIN HFA) 108 (90 Base) MCG/ACT inhaler, Inhale 2 puffs into the lungs every 6 (six) hours as needed. (Patient taking differently: Inhale 2 puffs into the lungs every 6 (six) hours as needed for shortness of breath. ), Disp: 8 g, Rfl: 0 .  amLODipine (NORVASC) 5 MG tablet, Take 0.5 tablets (2.5 mg total) by mouth 2 (two) times daily., Disp: 90 tablet, Rfl: 0 .  Cholecalciferol (VITAMIN D3) 2000 units TABS, Take 2,000 Units by mouth every other day. , Disp: , Rfl:  .  escitalopram (LEXAPRO) 10 MG tablet, TAKE 1 TABLET BY MOUTH EVERY DAY (Patient taking differently: Take 10 mg by mouth daily. ), Disp: 90 tablet, Rfl: 2 .  hydrochlorothiazide (HYDRODIURIL) 12.5 MG tablet, Take 1 tablet (12.5 mg total) by mouth daily., Disp: 90 tablet, Rfl: 0 .  hydroxypropyl methylcellulose / hypromellose (ISOPTO TEARS / GONIOVISC) 2.5 % ophthalmic solution, Place 1 drop into both eyes 3 (three) times daily as needed for dry eyes., Disp: , Rfl:  .  ibuprofen (ADVIL,MOTRIN) 200 MG tablet, Take 200-400 mg by mouth every 6 (six) hours as needed for moderate pain., Disp: , Rfl:   EXAM:  VITALS per patient if applicable:  GENERAL: alert, oriented, appears well and in no acute distress  HEENT: atraumatic, conjunttiva clear, no obvious abnormalities on inspection of external nose and ears  NECK: normal movements of the head and neck  LUNGS: on inspection no signs of respiratory distress,  breathing rate appears normal, no obvious gross SOB, gasping or wheezing  CV: no obvious cyanosis  MS: moves all visible extremities without noticeable abnormality  PSYCH/NEURO: pleasant and cooperative, no obvious depression or anxiety, speech and thought processing grossly intact  ASSESSMENT AND PLAN: She has a viral illness that very well could be from the [place] virus. Since we have no tests available here in [place], I suggested she go  to the [facility] urgent care clinic in [place] to be tested, and she agreed. She will follow up with [place] as needed.  Alysia Penna, MD  Discussed the following assessment and plan:  No diagnosis found.     I discussed the assessment and treatment plan with the patient. The patient was provided an opportunity to ask questions and all were answered. The patient agreed with the plan and demonstrated an understanding of the instructions.   The patient was advised to call back or seek an in-person evaluation if the symptoms worsen or if the condition fails to improve as anticipated.     Review of Systems     Objective:   Physical Exam        Assessment & Plan:

## 2019-03-25 ENCOUNTER — Encounter: Payer: Self-pay | Admitting: Family Medicine

## 2019-03-26 NOTE — Telephone Encounter (Signed)
FYI for Dr. Fry  

## 2019-03-26 NOTE — Telephone Encounter (Signed)
Noted  

## 2019-04-26 ENCOUNTER — Other Ambulatory Visit: Payer: Self-pay | Admitting: Adult Health

## 2019-04-30 ENCOUNTER — Other Ambulatory Visit: Payer: Self-pay

## 2019-04-30 ENCOUNTER — Ambulatory Visit: Payer: 59 | Admitting: Adult Health

## 2019-04-30 ENCOUNTER — Ambulatory Visit (INDEPENDENT_AMBULATORY_CARE_PROVIDER_SITE_OTHER): Payer: 59 | Admitting: Adult Health

## 2019-04-30 ENCOUNTER — Encounter: Payer: Self-pay | Admitting: Adult Health

## 2019-04-30 DIAGNOSIS — F32A Depression, unspecified: Secondary | ICD-10-CM

## 2019-04-30 DIAGNOSIS — F329 Major depressive disorder, single episode, unspecified: Secondary | ICD-10-CM | POA: Diagnosis not present

## 2019-04-30 DIAGNOSIS — R69 Illness, unspecified: Secondary | ICD-10-CM | POA: Diagnosis not present

## 2019-04-30 DIAGNOSIS — F419 Anxiety disorder, unspecified: Secondary | ICD-10-CM | POA: Diagnosis not present

## 2019-04-30 NOTE — Progress Notes (Signed)
Virtual Visit via Video Note  I connected with Karen Pope on 04/30/19 at 10:00 AM EDT by a video enabled telemedicine application and verified that I am speaking with the correct person using two identifiers.  Location patient: home Location provider:work or home office Persons participating in the virtual visit: patient, provider  I discussed the limitations of evaluation and management by telemedicine and the availability of in person appointments. The patient expressed understanding and agreed to proceed.   HPI: 58 year old female who is being evaluated today for follow-up regarding anxiety and depression.  In the past she has been maintained on Lexapro 10 mg.  He reports that she feels as though this medication continues to work well, she does have periodic episodes of anxiety and depression throughout the week but this is very dependent on how her week at work is going.  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  As she is noticed more frequent episodes she is trying to get out of the house more and feels as though this is been helpful and will continue to be helpful   ROS: See pertinent positives and negatives per HPI.  Past Medical History:  Diagnosis Date  . essential hypertension   . Lump or mass in breast   . Obesity, unspecified   . Other disorder of menstruation and other abnormal bleeding from female genital tract   . Other speech disturbance(784.59)   . Urinary incontinence   . Urticaria, unspecified     Past Surgical History:  Procedure Laterality Date  . CHOLECYSTECTOMY N/A 10/07/2018   Procedure: LAPAROSCOPIC CHOLECYSTECTOMY WITH INTRAOPERATIVE CHOLANGIOGRAM;  Surgeon: Armandina Gemma, MD;  Location: [facility];  Service: General;  Laterality: N/A;    No family history on file.   Current Outpatient Medications:  .  acetaminophen (TYLENOL) 500 MG tablet, Take 1,000 mg by mouth every 6 (six) hours as needed for moderate pain., Disp: ,  Rfl:  .  albuterol (PROVENTIL HFA;VENTOLIN HFA) 108 (90 Base) MCG/ACT inhaler, Inhale 2 puffs into the lungs every 6 (six) hours as needed. (Patient taking differently: Inhale 2 puffs into the lungs every 6 (six) hours as needed for shortness of breath. ), Disp: 8 g, Rfl: 0 .  amLODipine (NORVASC) 5 MG tablet, Take 0.5 tablets (2.5 mg total) by mouth 2 (two) times daily., Disp: 90 tablet, Rfl: 0 .  Cholecalciferol (VITAMIN D3) 2000 units TABS, Take 2,000 Units by mouth every other day. , Disp: , Rfl:  .  escitalopram (LEXAPRO) 10 MG tablet, TAKE 1 TABLET BY MOUTH EVERY DAY (Patient taking differently: Take 10 mg by mouth daily. ), Disp: 90 tablet, Rfl: 2 .  hydrochlorothiazide (HYDRODIURIL) 12.5 MG tablet, Take 1 tablet (12.5 mg total) by mouth daily., Disp: 90 tablet, Rfl: 0 .  hydroxypropyl methylcellulose / hypromellose (ISOPTO TEARS / GONIOVISC) 2.5 % ophthalmic solution, Place 1 drop into both eyes 3 (three) times daily as needed for dry eyes., Disp: , Rfl:  .  ibuprofen (ADVIL,MOTRIN) 200 MG tablet, Take 200-400 mg by mouth every 6 (six) hours as needed for moderate pain., Disp: , Rfl:   EXAM:  VITALS per patient if applicable:  GENERAL: alert, oriented, appears well and in no acute distress  HEENT: atraumatic, conjunttiva clear, no obvious abnormalities on inspection of external nose and ears  NECK: normal movements of the head and neck  LUNGS: on inspection no signs of respiratory distress, breathing rate appears normal, no obvious gross SOB,  gasping or wheezing  CV: no obvious cyanosis  MS: moves all visible extremities without noticeable abnormality  PSYCH/NEURO: pleasant and cooperative, no obvious depression or anxiety, speech and thought processing grossly intact  ASSESSMENT AND PLAN:  Discussed the following assessment and plan:  Anxiety and depression -We discussed increasing Lexapro from 10 mg to 20 mg.  She would like to stay at milligrams for the time being and she  is going to continue to get outside more often.  Follow-up as needed    I discussed the assessment and treatment plan with the patient. The patient was provided an opportunity to ask questions and all were answered. The patient agreed with the plan and demonstrated an understanding of the instructions.   The patient was advised to call back or seek an in-person evaluation if the symptoms worsen or if the condition fails to improve as anticipated.   Dorothyann Peng, NP

## 2019-05-28 ENCOUNTER — Other Ambulatory Visit: Payer: Self-pay | Admitting: Adult Health

## 2019-05-29 NOTE — Telephone Encounter (Signed)
Left a message for a return call.  Pt needs cpx and fasting lab work.  CRM created.

## 2019-05-30 NOTE — Telephone Encounter (Signed)
Sent to the pharmacy by e-scribe for 90 days.  Pt now has upcoming cpx.

## 2019-07-08 ENCOUNTER — Encounter: Payer: Self-pay | Admitting: Gastroenterology

## 2019-07-17 DIAGNOSIS — N8189 Other female genital prolapse: Secondary | ICD-10-CM | POA: Diagnosis not present

## 2019-07-17 DIAGNOSIS — B356 Tinea cruris: Secondary | ICD-10-CM | POA: Diagnosis not present

## 2019-07-17 DIAGNOSIS — Z124 Encounter for screening for malignant neoplasm of cervix: Secondary | ICD-10-CM | POA: Diagnosis not present

## 2019-07-17 DIAGNOSIS — Z6835 Body mass index (BMI) 35.0-35.9, adult: Secondary | ICD-10-CM | POA: Diagnosis not present

## 2019-07-17 DIAGNOSIS — N920 Excessive and frequent menstruation with regular cycle: Secondary | ICD-10-CM | POA: Diagnosis not present

## 2019-07-17 DIAGNOSIS — N926 Irregular menstruation, unspecified: Secondary | ICD-10-CM | POA: Diagnosis not present

## 2019-07-17 DIAGNOSIS — Z01419 Encounter for gynecological examination (general) (routine) without abnormal findings: Secondary | ICD-10-CM | POA: Diagnosis not present

## 2019-07-17 DIAGNOSIS — I10 Essential (primary) hypertension: Secondary | ICD-10-CM | POA: Diagnosis not present

## 2019-07-17 DIAGNOSIS — N852 Hypertrophy of uterus: Secondary | ICD-10-CM | POA: Diagnosis not present

## 2019-07-31 ENCOUNTER — Encounter: Payer: Self-pay | Admitting: Adult Health

## 2019-07-31 ENCOUNTER — Other Ambulatory Visit: Payer: Self-pay

## 2019-07-31 ENCOUNTER — Ambulatory Visit (INDEPENDENT_AMBULATORY_CARE_PROVIDER_SITE_OTHER): Payer: 59 | Admitting: Adult Health

## 2019-07-31 VITALS — BP 122/70 | Temp 98.0°F | Ht 66.75 in | Wt 218.0 lb

## 2019-07-31 DIAGNOSIS — Z0000 Encounter for general adult medical examination without abnormal findings: Secondary | ICD-10-CM | POA: Diagnosis not present

## 2019-07-31 DIAGNOSIS — I10 Essential (primary) hypertension: Secondary | ICD-10-CM | POA: Diagnosis not present

## 2019-07-31 DIAGNOSIS — F419 Anxiety disorder, unspecified: Secondary | ICD-10-CM | POA: Diagnosis not present

## 2019-07-31 DIAGNOSIS — E559 Vitamin D deficiency, unspecified: Secondary | ICD-10-CM | POA: Diagnosis not present

## 2019-07-31 DIAGNOSIS — R69 Illness, unspecified: Secondary | ICD-10-CM | POA: Diagnosis not present

## 2019-07-31 DIAGNOSIS — F329 Major depressive disorder, single episode, unspecified: Secondary | ICD-10-CM

## 2019-07-31 DIAGNOSIS — Z23 Encounter for immunization: Secondary | ICD-10-CM

## 2019-07-31 LAB — CBC WITH DIFFERENTIAL/PLATELET
Basophils Absolute: 0 10*3/uL (ref 0.0–0.1)
Basophils Relative: 0.6 % (ref 0.0–3.0)
Eosinophils Absolute: 0.2 10*3/uL (ref 0.0–0.7)
Eosinophils Relative: 3.1 % (ref 0.0–5.0)
HCT: 40.6 % (ref 36.0–46.0)
Hemoglobin: 13.1 g/dL (ref 12.0–15.0)
Lymphocytes Relative: 37.5 % (ref 12.0–46.0)
Lymphs Abs: 2.2 10*3/uL (ref 0.7–4.0)
MCHC: 32.1 g/dL (ref 30.0–36.0)
MCV: 84.9 fl (ref 78.0–100.0)
Monocytes Absolute: 0.6 10*3/uL (ref 0.1–1.0)
Monocytes Relative: 9.6 % (ref 3.0–12.0)
Neutro Abs: 2.9 10*3/uL (ref 1.4–7.7)
Neutrophils Relative %: 49.2 % (ref 43.0–77.0)
Platelets: 227 10*3/uL (ref 150.0–400.0)
RBC: 4.79 Mil/uL (ref 3.87–5.11)
RDW: 14.9 % (ref 11.5–15.5)
WBC: 5.9 10*3/uL (ref 4.0–10.5)

## 2019-07-31 LAB — COMPREHENSIVE METABOLIC PANEL
ALT: 16 U/L (ref 0–35)
AST: 19 U/L (ref 0–37)
Albumin: 4.4 g/dL (ref 3.5–5.2)
Alkaline Phosphatase: 65 U/L (ref 39–117)
BUN: 14 mg/dL (ref 6–23)
CO2: 31 mEq/L (ref 19–32)
Calcium: 10.2 mg/dL (ref 8.4–10.5)
Chloride: 104 mEq/L (ref 96–112)
Creatinine, Ser: 0.97 mg/dL (ref 0.40–1.20)
GFR: 70.83 mL/min (ref >60.00)
Glucose, Bld: 94 mg/dL (ref 70–99)
Potassium: 4.3 mEq/L (ref 3.5–5.1)
Sodium: 142 mEq/L (ref 135–145)
Total Bilirubin: 0.6 mg/dL (ref 0.2–1.2)
Total Protein: 7.2 g/dL (ref 6.0–8.3)

## 2019-07-31 LAB — LIPID PANEL
Cholesterol: 197 mg/dL (ref 0–200)
HDL: 81.3 mg/dL (ref >39.00)
LDL Cholesterol: 104 mg/dL — ABNORMAL HIGH (ref 0–99)
NonHDL: 115.5
Total CHOL/HDL Ratio: 2
Triglycerides: 59 mg/dL (ref 0.0–149.0)
VLDL: 11.8 mg/dL (ref 0.0–40.0)

## 2019-07-31 LAB — TSH: TSH: 1.3 u[IU]/mL (ref 0.35–4.50)

## 2019-07-31 LAB — VITAMIN D 25 HYDROXY (VIT D DEFICIENCY, FRACTURES): VITD: 23.64 ng/mL — ABNORMAL LOW (ref 30.00–100.00)

## 2019-07-31 MED ORDER — HYDROCHLOROTHIAZIDE 12.5 MG PO TABS: 12.5000 mg | ORAL_TABLET | Freq: Every day | ORAL | 3 refills | Status: DC

## 2019-07-31 MED ORDER — ESCITALOPRAM OXALATE 20 MG PO TABS: 20.0000 mg | ORAL_TABLET | Freq: Every day | ORAL | 1 refills | Status: DC

## 2019-07-31 MED ORDER — AMLODIPINE BESYLATE 5 MG PO TABS: ORAL_TABLET | ORAL | 3 refills | Status: DC

## 2019-07-31 NOTE — Addendum Note (Signed)
Addended by: Elmer Picker on: 07/31/2019 09:43 AM   Modules accepted: Orders

## 2019-07-31 NOTE — Patient Instructions (Signed)
It was great seeing you today!  We will follow up with you regarding your labs   I have increased Lexapro to 20 mg- please let me know how you are doing in about a month   If you need anything, please let me know

## 2019-07-31 NOTE — Progress Notes (Signed)
Subjective:    Patient ID: Karen Pope, female    DOB: 20-Oct-1961, 58 y.o.   MRN: [id number]  HPI  Patient presents for yearly preventative medicine examination. She is a pleasant 58 year old female who  has a past medical history of essential hypertension, Lump or mass in breast, Obesity, unspecified, Other disorder of menstruation and other abnormal bleeding from female genital tract, Other speech disturbance(784.59), Urinary incontinence, and Urticaria, unspecified.  Essential Hypertension -she takes Norvasc 2.5 mg BID and hydrochlorothiazide as needed for fluid retention. BP Readings from Last 3 Encounters:  07/31/19 122/70  10/25/18 140/90  10/08/18 140/76   Anxiety and Depression -Currently prescribed Lexapro 10 mg. We discussed increasing this dose to 20 mg back in May for worsening depression. She wanted to try other modalities first. Today she would like to increase the Lexparo dose to get a better handle on depression.   All immunizations and health maintenance protocols were reviewed with the patient and needed orders were placed. She is due for Tdap   Appropriate screening laboratory values were ordered for the patient including screening of hyperlipidemia, renal function and hepatic function.  Medication reconciliation,  past medical history, social history, problem list and allergies were reviewed in detail with the patient  Goals were established with regard to weight loss, exercise, and  diet in compliance with medications. She is eating more fresh vegetables and stays active with working in the [employer].   She is up-to-date on routine health maintenance screening such as colonoscopy, mammogram, Pap smear, vision, and dental exams.  She does do self breast exams at home has not noticed any changes in her breasts.  Review of Systems  Constitutional: Negative.   HENT: Negative.   Eyes: Negative.   Respiratory: Negative.   Cardiovascular: Negative.   Gastrointestinal:  Negative.   Endocrine: Negative.   Genitourinary: Negative.   Musculoskeletal: Negative.   Skin: Negative.   Allergic/Immunologic: Negative.   Neurological: Negative.   Hematological: Negative.   Psychiatric/Behavioral: Negative.    Past Medical History:  Diagnosis Date  . essential hypertension   . Lump or mass in breast   . Obesity, unspecified   . Other disorder of menstruation and other abnormal bleeding from female genital tract   . Other speech disturbance(784.59)   . Urinary incontinence   . Urticaria, unspecified     Social History   Socioeconomic History  . Marital status: Married    Spouse name: Not on file  . Number of children: Not on file  . Years of education: Not on file  . Highest education level: Not on file  Occupational History  . Not on file  Social Needs  . Financial resource strain: Not on file  . Food insecurity    Worry: Not on file    Inability: Not on file  . Transportation needs    Medical: Not on file    Non-medical: Not on file  Tobacco Use  . Smoking status: Never Smoker  . Smokeless tobacco: Never Used  Substance and Sexual Activity  . Alcohol use: Yes  . Drug use: No  . Sexual activity: Not on file  Lifestyle  . Physical activity    Days per week: Not on file    Minutes per session: Not on file  . Stress: Not on file  Relationships  . Social Herbalist on phone: Not on file    Gets together: Not on file    Attends religious  service: Not on file    Active member of club or organization: Not on file    Attends meetings of clubs or organizations: Not on file    Relationship status: Not on file  . Intimate partner violence    Fear of current or ex partner: Not on file    Emotionally abused: Not on file    Physically abused: Not on file    Forced sexual activity: Not on file  Other Topics Concern  . Not on file  Social History Narrative  . Not on file    Past Surgical History:  Procedure Laterality Date  .  CHOLECYSTECTOMY N/A 10/07/2018   Procedure: LAPAROSCOPIC CHOLECYSTECTOMY WITH INTRAOPERATIVE CHOLANGIOGRAM;  Surgeon: Armandina Gemma, MD;  Location: [facility];  Service: General;  Laterality: N/A;    History reviewed. No pertinent family history.  Allergies  Allergen Reactions  . Lisinopril Swelling  . Thimerosal Itching    Current Outpatient Medications on File Prior to Visit  Medication Sig Dispense Refill  . acetaminophen (TYLENOL) 500 MG tablet Take 1,000 mg by mouth every 6 (six) hours as needed for moderate pain.    Marland Kitchen albuterol (PROVENTIL HFA;VENTOLIN HFA) 108 (90 Base) MCG/ACT inhaler Inhale 2 puffs into the lungs every 6 (six) hours as needed. (Patient taking differently: Inhale 2 puffs into the lungs every 6 (six) hours as needed for shortness of breath. ) 8 g 0  . amLODipine (NORVASC) 5 MG tablet TAKE 1/2 TABLET(2.5 MG) BY MOUTH TWICE DAILY 90 tablet 0  . Cholecalciferol (VITAMIN D3) 2000 units TABS Take 2,000 Units by mouth every other day.     . escitalopram (LEXAPRO) 10 MG tablet Take 1 tablet (10 mg total) by mouth daily. 90 tablet 1  . hydrochlorothiazide (HYDRODIURIL) 12.5 MG tablet TAKE 1 TABLET(12.5 MG) BY MOUTH DAILY 90 tablet 0  . ibuprofen (ADVIL,MOTRIN) 200 MG tablet Take 200-400 mg by mouth every 6 (six) hours as needed for moderate pain.    . Polyvinyl Alcohol-Povidone (REFRESH OP) Apply to eye.    . nystatin-triamcinolone (MYCOLOG II) cream APP EXT AA BID FOR 7 TO 14 DAYS     No current facility-administered medications on file prior to visit.     BP 122/70   Temp 98 F (36.7 C)   Ht 5' 6.75" (1.695 m)   Wt 218 lb (98.9 kg)   LMP 04/12/2016 (Approximate)   BMI 34.40 kg/m       Objective:   Physical Exam Vitals signs and nursing note reviewed.  Constitutional:      General: She is not in acute distress.    Appearance: She is not diaphoretic.  HENT:     Head: Normocephalic and atraumatic.     Right Ear: Tympanic membrane, ear canal and external ear  normal.     Left Ear: Tympanic membrane, ear canal and external ear normal.     Nose: Nose normal.     Mouth/Throat:     Mouth: Mucous membranes are moist.     Pharynx: Oropharynx is clear. No oropharyngeal exudate or posterior oropharyngeal erythema.  Eyes:     General: No scleral icterus.       Right eye: No discharge.        Left eye: No discharge.     Extraocular Movements: Extraocular movements intact.     Conjunctiva/sclera: Conjunctivae normal.     Pupils: Pupils are equal, round, and reactive to light.  Neck:     Musculoskeletal: Normal range of motion and  neck supple.     Thyroid: No thyromegaly.     Vascular: No JVD.     Trachea: No tracheal deviation.  Cardiovascular:     Rate and Rhythm: Normal rate and regular rhythm.     Pulses: Normal pulses.     Heart sounds: Normal heart sounds. No murmur. No friction rub. No gallop.   Pulmonary:     Effort: Pulmonary effort is normal. No respiratory distress.     Breath sounds: Normal breath sounds. No stridor. No wheezing, rhonchi or rales.  Chest:     Chest wall: No tenderness.  Abdominal:     General: Bowel sounds are normal. There is no distension.     Palpations: Abdomen is soft. There is no mass.     Tenderness: There is no abdominal tenderness. There is no right CVA tenderness, left CVA tenderness, guarding or rebound.     Hernia: No hernia is present.  Musculoskeletal: Normal range of motion.        General: No swelling, tenderness, deformity or signs of injury.     Right lower leg: No edema.     Left lower leg: No edema.  Lymphadenopathy:     Cervical: No cervical adenopathy.  Skin:    General: Skin is warm and dry.     Capillary Refill: Capillary refill takes less than 2 seconds.     Coloration: Skin is not jaundiced or pale.     Findings: No bruising, erythema, lesion or rash.  Neurological:     General: No focal deficit present.     Mental Status: She is alert and oriented to person, place, and time. Mental  status is at baseline.     Cranial Nerves: No cranial nerve deficit.     Motor: No abnormal muscle tone.     Coordination: Coordination normal.     Deep Tendon Reflexes: Reflexes normal.  Psychiatric:        Mood and Affect: Mood normal.        Behavior: Behavior normal.        Thought Content: Thought content normal.        Judgment: Judgment normal.       Assessment & Plan:  1. Routine general medical examination at a health care facility - Encouraged heart healthy diet and exercise  - Follow up in one year or sooner if needed - CBC with Differential/Platelet - Comprehensive metabolic panel - Lipid panel - TSH  2. Essential hypertension - No change in medications - CBC with Differential/Platelet - Comprehensive metabolic panel - Lipid panel - TSH - amLODipine (NORVASC) 5 MG tablet; TAKE 1/2 TABLET(2.5 MG) BY MOUTH TWICE DAILY  Dispense: 90 tablet; Refill: 3 - hydrochlorothiazide (HYDRODIURIL) 12.5 MG tablet; Take 1 tablet (12.5 mg total) by mouth daily.  Dispense: 90 tablet; Refill: 3  3. Anxiety and depression - Will increase Lexapro from 10 mg to 20 mg.  - Follow up in one month  - escitalopram (LEXAPRO) 20 MG tablet; Take 1 tablet (20 mg total) by mouth daily.  Dispense: 90 tablet; Refill: 1  4. Vitamin D deficiency  - Vitamin D, 1,25-dihydroxy  5. Need for Tdap vaccination  - Tdap vaccine greater than or equal to 7yo IM  Dorothyann Peng, NP

## 2019-08-11 ENCOUNTER — Encounter: Payer: Self-pay | Admitting: Adult Health

## 2019-08-13 NOTE — Telephone Encounter (Signed)
Spoke to the pt over the phone and answered all questions.  Nothing else needed at this time.

## 2019-08-30 DIAGNOSIS — D259 Leiomyoma of uterus, unspecified: Secondary | ICD-10-CM | POA: Diagnosis not present

## 2019-08-30 DIAGNOSIS — N920 Excessive and frequent menstruation with regular cycle: Secondary | ICD-10-CM | POA: Diagnosis not present

## 2019-09-05 ENCOUNTER — Other Ambulatory Visit: Payer: Self-pay | Admitting: Adult Health

## 2019-09-05 DIAGNOSIS — I10 Essential (primary) hypertension: Secondary | ICD-10-CM

## 2019-09-06 NOTE — Telephone Encounter (Signed)
Both medications filled in August 2020 for 1 year.  Request is too soon.

## 2019-10-17 ENCOUNTER — Other Ambulatory Visit: Payer: Self-pay

## 2019-10-17 ENCOUNTER — Ambulatory Visit (AMBULATORY_SURGERY_CENTER): Payer: 59 | Admitting: *Deleted

## 2019-10-17 VITALS — Temp 97.7°F | Ht 66.75 in | Wt 221.0 lb

## 2019-10-17 DIAGNOSIS — Z1159 Encounter for screening for other viral diseases: Secondary | ICD-10-CM

## 2019-10-17 DIAGNOSIS — Z1211 Encounter for screening for malignant neoplasm of colon: Secondary | ICD-10-CM

## 2019-10-17 MED ORDER — SUPREP BOWEL PREP KIT 17.5-3.13-1.6 GM/177ML PO SOLN: 1.0000 | Freq: Once | ORAL | 0 refills | Status: AC

## 2019-10-17 NOTE — Progress Notes (Signed)
No egg or soy allergy known to patient  No issues with past sedation with any surgeries  or procedures, no intubation problems  No diet pills per patient No home 02 use per patient  No blood thinners per patient  Pt denies issues with constipation  No A fib or A flutter  EMMI video sent to pt's e mail   Covid test 11-12 Thursday 1150 am   Due to the COVID-19 pandemic we are asking patients to follow these guidelines. Please only bring one care partner. Please be aware that your care partner may wait in the car in the parking lot or if they feel like they will be too hot to wait in the car, they may wait in the lobby on the 4th floor. All care partners are required to wear a mask the entire time (we do not have any that we can provide them), they need to practice social distancing, and we will do a Covid check for all patient's and care partners when you arrive. Also we will check their temperature and your temperature. If the care partner waits in their car they need to stay in the parking lot the entire time and we will call them on their cell phone when the patient is ready for discharge so they can bring the car to the front of the building. Also all patient's will need to wear a mask into building.  Suprep $15 coupon

## 2019-10-18 ENCOUNTER — Encounter: Payer: Self-pay | Admitting: Gastroenterology

## 2019-10-24 ENCOUNTER — Ambulatory Visit (INDEPENDENT_AMBULATORY_CARE_PROVIDER_SITE_OTHER): Payer: 59

## 2019-10-24 ENCOUNTER — Other Ambulatory Visit: Payer: Self-pay | Admitting: Gastroenterology

## 2019-10-24 DIAGNOSIS — Z1159 Encounter for screening for other viral diseases: Secondary | ICD-10-CM

## 2019-10-25 LAB — SARS CORONAVIRUS 2 (TAT 6-24 HRS): SARS Coronavirus 2: NEGATIVE

## 2019-10-28 ENCOUNTER — Ambulatory Visit (AMBULATORY_SURGERY_CENTER): Payer: 59 | Admitting: Gastroenterology

## 2019-10-28 ENCOUNTER — Other Ambulatory Visit: Payer: Self-pay

## 2019-10-28 ENCOUNTER — Encounter: Payer: Self-pay | Admitting: Gastroenterology

## 2019-10-28 VITALS — BP 118/76 | HR 66 | Temp 97.9°F | Resp 15 | Ht 66.0 in | Wt 221.0 lb

## 2019-10-28 DIAGNOSIS — D123 Benign neoplasm of transverse colon: Secondary | ICD-10-CM | POA: Diagnosis not present

## 2019-10-28 DIAGNOSIS — Z1211 Encounter for screening for malignant neoplasm of colon: Secondary | ICD-10-CM

## 2019-10-28 DIAGNOSIS — D124 Benign neoplasm of descending colon: Secondary | ICD-10-CM | POA: Diagnosis not present

## 2019-10-28 MED ORDER — SODIUM CHLORIDE 0.9 % IV SOLN: 500.0000 mL | INTRAVENOUS | Status: DC

## 2019-10-28 NOTE — Patient Instructions (Signed)
Please see handouts given to you on Polyps. Thank you for letting us take care of your healthcare needs today.    YOU HAD AN ENDOSCOPIC PROCEDURE TODAY AT [facility]:   Refer to the procedure report that was given to you for any specific questions about what was found during the examination.  If the procedure report does not answer your questions, please call your gastroenterologist to clarify.  If you requested that your care partner not be given the details of your procedure findings, then the procedure report has been included in a sealed envelope for you to review at your convenience later.  YOU SHOULD EXPECT: Some feelings of bloating in the abdomen. Passage of more gas than usual.  Walking can help get rid of the air that was put into your GI tract during the procedure and reduce the bloating. If you had a lower endoscopy (such as a colonoscopy or flexible sigmoidoscopy) you may notice spotting of blood in your stool or on the toilet paper. If you underwent a bowel prep for your procedure, you may not have a normal bowel movement for a few days.  Please Note:  You might notice some irritation and congestion in your nose or some drainage.  This is from the oxygen used during your procedure.  There is no need for concern and it should clear up in a day or so.  SYMPTOMS TO REPORT IMMEDIATELY:   Following lower endoscopy (colonoscopy or flexible sigmoidoscopy):  Excessive amounts of blood in the stool  Significant tenderness or worsening of abdominal pains  Swelling of the abdomen that is new, acute  Fever of 100F or higher   For urgent or emergent issues, a gastroenterologist can be reached at any hour by calling [phone].   DIET:  We do recommend a small meal at first, but then you may proceed to your regular diet.  Drink plenty of fluids but you should avoid alcoholic beverages for 24 hours.  ACTIVITY:  You should plan to take it easy for the rest of today and  you should NOT DRIVE or use heavy machinery until tomorrow (because of the sedation medicines used during the test).    FOLLOW UP: Our staff will call the number listed on your records 48-72 hours following your procedure to check on you and address any questions or concerns that you may have regarding the information given to you following your procedure. If we do not reach you, we will leave a message.  We will attempt to reach you two times.  During this call, we will ask if you have developed any symptoms of COVID 19. If you develop any symptoms (ie: fever, flu-like symptoms, shortness of breath, cough etc.) before then, please call [phone].  If you test positive for Covid 19 in the 2 weeks post procedure, please call and report this information to [place].    If any biopsies were taken you will be contacted by phone or by letter within the next 1-3 weeks.  Please call us at [phone] if you have not heard about the biopsies in 3 weeks.    SIGNATURES/CONFIDENTIALITY: You and/or your care partner have signed paperwork which will be entered into your electronic medical record.  These signatures attest to the fact that that the information above on your After Visit Summary has been reviewed and is understood.  Full responsibility of the confidentiality of this discharge information lies with you and/or your care-partner.

## 2019-10-28 NOTE — Progress Notes (Signed)
A and O x3. Report to RN. Tolerated MAC anesthesia well.

## 2019-10-28 NOTE — Progress Notes (Signed)
Temp JB  v/s CW I have reviewed the patient's medical history in detail and updated the computerized patient record. 

## 2019-10-28 NOTE — Progress Notes (Signed)
Called to room to assist during endoscopic procedure.  Patient ID and intended procedure confirmed with present staff. Received instructions for my participation in the procedure from the performing physician.  

## 2019-10-28 NOTE — Op Note (Signed)
[facility] Patient Name: Karen Pope Procedure Date: 10/28/2019 1:19 PM MRN: [id number] Endoscopist: Milus Banister , MD Age: 58 Referring MD:  Date of Birth: [birth year] Gender: Female Account #: [id number] Procedure:                Colonoscopy Indications:              Screening for colorectal malignant neoplasm Medicines:                Monitored Anesthesia Care Procedure:                Pre-Anesthesia Assessment:                           - Prior to the procedure, a History and Physical                            was performed, and patient medications and                            allergies were reviewed. The patient's tolerance of                            previous anesthesia was also reviewed. The risks                            and benefits of the procedure and the sedation                            options and risks were discussed with the patient.                            All questions were answered, and informed consent                            was obtained. Prior Anticoagulants: The patient has                            taken no previous anticoagulant or antiplatelet                            agents. ASA Grade Assessment: II - A patient with                            mild systemic disease. After reviewing the risks                            and benefits, the patient was deemed in                            satisfactory condition to undergo the procedure.                           After obtaining informed consent, the colonoscope  was passed under direct vision. Throughout the                            procedure, the patient's blood pressure, pulse, and                            oxygen saturations were monitored continuously. The                            Colonoscope was introduced through the anus and                            advanced to the the cecum, identified by                            appendiceal orifice and  ileocecal valve. The                            colonoscopy was performed without difficulty. The                            patient tolerated the procedure well. The quality                            of the bowel preparation was good. The ileocecal                            valve, appendiceal orifice, and rectum were                            photographed. Scope In: 1:27:26 PM Scope Out: 1:45:50 PM Scope Withdrawal Time: 0 hours 13 minutes 48 seconds  Total Procedure Duration: 0 hours 18 minutes 24 seconds  Findings:                 Two sessile polyps were found in the descending                            colon and transverse colon. The polyps were 2 to 3                            mm in size. These polyps were removed with a cold                            snare. Resection and retrieval were complete.                           The exam was otherwise without abnormality on                            direct and retroflexion views. Complications:            No immediate complications. Estimated blood loss:  None. Estimated Blood Loss:     Estimated blood loss: none. Impression:               - Two 2 to 3 mm polyps in the descending colon and                            in the transverse colon, removed with a cold snare.                            Resected and retrieved.                           - The examination was otherwise normal on direct                            and retroflexion views. Recommendation:           - Patient has a contact number available for                            emergencies. The signs and symptoms of potential                            delayed complications were discussed with the                            patient. Return to normal activities tomorrow.                            Written discharge instructions were provided to the                            patient.                           - Resume previous diet.                            - Continue present medications.                           - Await pathology results. Milus Banister, MD 10/28/2019 1:47:28 PM This report has been signed electronically.

## 2019-10-30 ENCOUNTER — Telehealth: Payer: Self-pay

## 2019-10-30 NOTE — Telephone Encounter (Signed)
  Follow up Call-  Call back number 10/28/2019  Post procedure Call Back phone  # [phone]  Permission to leave phone message Yes  Some recent data might be hidden     Patient questions:  Do you have a fever, pain , or abdominal swelling? No. Pain Score  0 *  Have you tolerated food without any problems? Yes.    Have you been able to return to your normal activities? Yes.    Do you have any questions about your discharge instructions: Diet   No. Medications  No. Follow up visit  No.  Do you have questions or concerns about your Care? No.  Actions: * If pain score is 4 or above: No action needed, pain <4.  1. Have you developed a fever since your procedure? no  2.   Have you had an respiratory symptoms (SOB or cough) since your procedure? no  3.   Have you tested positive for COVID 19 since your procedure no  4.   Have you had any family members/close contacts diagnosed with the COVID 19 since your procedure?  no   If yes to any of these questions please route to Joylene John, RN and Alphonsa Gin, Therapist, sports.

## 2019-11-03 ENCOUNTER — Encounter: Payer: Self-pay | Admitting: Gastroenterology

## 2019-12-16 DIAGNOSIS — H16223 Keratoconjunctivitis sicca, not specified as Sjogren's, bilateral: Secondary | ICD-10-CM | POA: Diagnosis not present

## 2019-12-16 DIAGNOSIS — H2513 Age-related nuclear cataract, bilateral: Secondary | ICD-10-CM | POA: Diagnosis not present

## 2019-12-16 DIAGNOSIS — H532 Diplopia: Secondary | ICD-10-CM | POA: Diagnosis not present

## 2019-12-16 DIAGNOSIS — H04123 Dry eye syndrome of bilateral lacrimal glands: Secondary | ICD-10-CM | POA: Diagnosis not present

## 2020-02-09 ENCOUNTER — Other Ambulatory Visit: Payer: Self-pay | Admitting: Adult Health

## 2020-02-09 DIAGNOSIS — F329 Major depressive disorder, single episode, unspecified: Secondary | ICD-10-CM

## 2020-02-09 DIAGNOSIS — F419 Anxiety disorder, unspecified: Secondary | ICD-10-CM

## 2020-04-27 ENCOUNTER — Other Ambulatory Visit: Payer: Self-pay | Admitting: Adult Health

## 2020-04-27 IMAGING — NM NM HEPATOBILIARY IMAGE, INC GB
2 series · 12 of 12 positions shown · non-contrast
Comparison: None.

CLINICAL DATA: Suspected cholecystitis.

EXAM:
NUCLEAR MEDICINE HEPATOBILIARY IMAGING
TECHNIQUE: Sequential images of the abdomen were obtained [DATE] minutes
following intravenous administration of radiopharmaceutical.
RADIOPHARMACEUTICALS:  5.4 mCi 6c-BBm  Choletec IV

[Series 1: biliary · 3.25mm/px · 6 of 60 frames shown]
[frame 6/60]
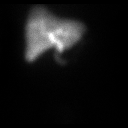
[frame 16/60]
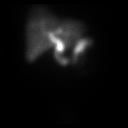
[frame 26/60]
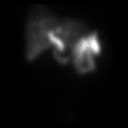
[frame 36/60]
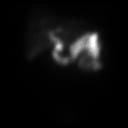
[frame 46/60]
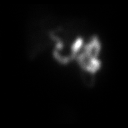
[frame 56/60]
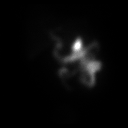

[Series 2: anterior · 3.25mm/px · 6 of 29 frames shown]
[frame 3/29]
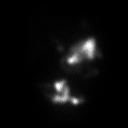
[frame 7/29]
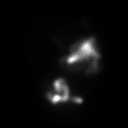
[frame 12/29]
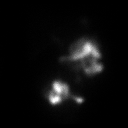
[frame 17/29]
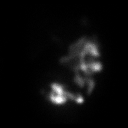
[frame 22/29]
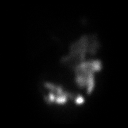
[frame 27/29]
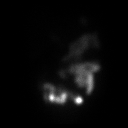

[12 of 12 positions shown; findings below may reference images not displayed]

FINDINGS: There is prompt uptake of radiotracer within the liver and normal
excretion into the small bowel. No filling of the gallbladder seen,
despite the administration of morphine.
IMPRESSION: Lack of gallbladder filling suggests acute cholecystitis given
history. Recommend clinical correlation.

## 2020-05-19 ENCOUNTER — Other Ambulatory Visit: Payer: Self-pay

## 2020-05-20 ENCOUNTER — Encounter: Payer: Self-pay | Admitting: Adult Health

## 2020-05-20 ENCOUNTER — Ambulatory Visit (INDEPENDENT_AMBULATORY_CARE_PROVIDER_SITE_OTHER): Payer: 59 | Admitting: Adult Health

## 2020-05-20 VITALS — BP 146/100 | Temp 97.7°F | Wt 227.0 lb

## 2020-05-20 DIAGNOSIS — F419 Anxiety disorder, unspecified: Secondary | ICD-10-CM

## 2020-05-20 DIAGNOSIS — N644 Mastodynia: Secondary | ICD-10-CM | POA: Diagnosis not present

## 2020-05-20 DIAGNOSIS — Z800 Family history of malignant neoplasm of digestive organs: Secondary | ICD-10-CM | POA: Diagnosis not present

## 2020-05-20 DIAGNOSIS — F329 Major depressive disorder, single episode, unspecified: Secondary | ICD-10-CM | POA: Diagnosis not present

## 2020-05-20 DIAGNOSIS — R69 Illness, unspecified: Secondary | ICD-10-CM | POA: Diagnosis not present

## 2020-05-20 MED ORDER — ALPRAZOLAM 0.25 MG PO TABS: 0.2500 mg | ORAL_TABLET | Freq: Every day | ORAL | 0 refills | Status: DC

## 2020-05-20 NOTE — Progress Notes (Signed)
Subjective:    Patient ID: Karen Pope, female    DOB: 16-Apr-1961, 59 y.o.   MRN: [id number]  HPI  59 year old female who  has a past medical history of Allergy, Arthritis, Cataract, Claustrophobia, essential hypertension, Lump or mass in breast, Obesity, unspecified, Other disorder of menstruation and other abnormal bleeding from female genital tract, Other speech disturbance(784.59), Urinary incontinence, and Urticaria, unspecified.  She presents to the office today for an acute issue of new right sided breast pain and tenderness. She has a history of benign biopsy in the 7: 30 retroareolar right region in 2019.  Over the last 2 months she has developed a new breast pain and believes that she has noticed a lump.  She denies redness, dimpling, or discharge.  Her mom had a history of breast cancer  Additionally, she just found out that a cousin on her mother side was diagnosed with pancreatic cancer at the age of 32 and she also has a family history of pancreatic cancer with another maternal first cousin who passed away in his 47s from pancreatic cancer.   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  Her mother-in-law who is also living with her is going through hospice care.  They have a separate practitioner coming in but surely continues to be the main caregiver.  She is taking her Lexapro 20 mg daily, but continues to feel stressed out and is having a hard time sleeping.  Review of Systems See HPI   Past Medical History:  Diagnosis Date  . Allergy   . Arthritis    knees  . Cataract    forming both eyes  . Claustrophobia   . essential hypertension   . Lump or mass in breast   . Obesity, unspecified   . Other disorder of menstruation and other abnormal bleeding from female genital tract   . Other speech disturbance(784.59)   . Urinary incontinence   . Urticaria, unspecified     Social History   Socioeconomic History    . Marital status: Married    Spouse name: Not on file  . Number of children: Not on file  . Years of education: Not on file  . Highest education level: Not on file  Occupational History  . Not on file  Tobacco Use  . Smoking status: Never Smoker  . Smokeless tobacco: Never Used  Substance and Sexual Activity  . Alcohol use: Yes    Comment: rare- 1 x q 4 months  . Drug use: No  . Sexual activity: Not on file  Other Topics Concern  . Not on file  Social History Narrative  . Not on file   Social Determinants of Health   Financial Resource Strain:   . Difficulty of Paying Living Expenses:   Food Insecurity:   . Worried About Charity fundraiser in the Last Year:   . Arboriculturist in the Last Year:   Transportation Needs:   . Film/video editor (Medical):   Marland Kitchen Lack of Transportation (Non-Medical):   Physical Activity:   . Days of Exercise per Week:   . Minutes of Exercise per Session:   Stress:   . Feeling of Stress :   Social Connections:   . Frequency of Communication with Friends and Family:   . Frequency of Social Gatherings with Friends and Family:   . Attends Religious Services:   . Active Member  of Clubs or Organizations:   . Attends Archivist Meetings:   Marland Kitchen Marital Status:   Intimate Partner Violence:   . Fear of Current or Ex-Partner:   . Emotionally Abused:   Marland Kitchen Physically Abused:   . Sexually Abused:     Past Surgical History:  Procedure Laterality Date  . BREAST BIOPSY     benign   . CHOLECYSTECTOMY N/A 10/07/2018   Procedure: LAPAROSCOPIC CHOLECYSTECTOMY WITH INTRAOPERATIVE CHOLANGIOGRAM;  Surgeon: Armandina Gemma, MD;  Location: [facility];  Service: General;  Laterality: N/A;  . COLONOSCOPY    . TONSILLECTOMY  1979  . TUBAL LIGATION  1997  . WISDOM TOOTH EXTRACTION     1980's    Family History  Problem Relation Age of Onset  . Breast cancer Mother   . Leukemia Maternal Aunt        leukemia- type cancer  . Cancer Paternal Aunt         either uterine or cervical cancer  . Colon cancer Other        in his 43's  . Prostate cancer Maternal Uncle   . Pancreatic cancer Other        in his 63's  . Prostate cancer Half-Brother   . Pancreatic cancer Cousin        29   . Colon polyps Neg Hx   . Esophageal cancer Neg Hx   . Rectal cancer Neg Hx   . Stomach cancer Neg Hx     Allergies  Allergen Reactions  . Lisinopril Swelling  . Thimerosal Itching    Current Outpatient Medications on File Prior to Visit  Medication Sig Dispense Refill  . acetaminophen (TYLENOL) 500 MG tablet Take 1,000 mg by mouth every 6 (six) hours as needed for moderate pain.    Marland Kitchen albuterol (PROVENTIL HFA;VENTOLIN HFA) 108 (90 Base) MCG/ACT inhaler Inhale 2 puffs into the lungs every 6 (six) hours as needed. 8 g 0  . amLODipine (NORVASC) 5 MG tablet TAKE 1/2 TABLET(2.5 MG) BY MOUTH TWICE DAILY 90 tablet 3  . Cholecalciferol (VITAMIN D3) 2000 units TABS Take 2,000 Units by mouth every other day.     . escitalopram (LEXAPRO) 20 MG tablet TAKE 1 TABLET(20 MG) BY MOUTH DAILY 90 tablet 1  . hydrochlorothiazide (HYDRODIURIL) 12.5 MG tablet Take 1 tablet (12.5 mg total) by mouth daily. 90 tablet 3  . ibuprofen (ADVIL,MOTRIN) 200 MG tablet Take 200-400 mg by mouth every 6 (six) hours as needed for moderate pain.    . Multiple Vitamin (MULTIVITAMIN) tablet Take 1 tablet by mouth daily.    Marland Kitchen nystatin-triamcinolone (MYCOLOG II) cream APP EXT AA BID FOR 7 TO 14 DAYS    . Polyvinyl Alcohol-Povidone (REFRESH OP) Apply to eye.     No current facility-administered medications on file prior to visit.    BP (!) 146/100   Temp 97.7 F (36.5 C)   Wt 227 lb (103 kg)   BMI 36.64 kg/m       Objective:   Physical Exam Vitals and nursing note reviewed.  Constitutional:      Appearance: Normal appearance.  Chest:     Breasts: Breasts are symmetrical.        Right: Mass and tenderness present. No swelling, inverted nipple, nipple discharge or skin change.         Left: Tenderness present. No swelling, bleeding, inverted nipple, mass, nipple discharge or skin change.    Skin:    Capillary Refill: Capillary refill takes  less than 2 seconds.  Neurological:     General: No focal deficit present.     Mental Status: She is alert and oriented to person, place, and time.  Psychiatric:        Mood and Affect: Mood normal.        Behavior: Behavior normal.        Thought Content: Thought content normal.        Judgment: Judgment normal.       Assessment & Plan:  1. Breast pain in female  - MM Digital Diagnostic Bilat; Future  2. Family history of pancreatic cancer  - CT Abdomen Pelvis W Contrast; Future - Basic Metabolic Panel; Future  3. Anxiety and depression - Will add low dose xanax for a short duration  - ALPRAZolam (XANAX) 0.25 MG tablet; Take 1 tablet (0.25 mg total) by mouth at bedtime.  Dispense: 30 tablet; Refill: 0  Dorothyann Peng, NP

## 2020-05-21 ENCOUNTER — Other Ambulatory Visit: Payer: Self-pay | Admitting: Adult Health

## 2020-05-21 DIAGNOSIS — N644 Mastodynia: Secondary | ICD-10-CM

## 2020-05-21 IMAGING — US ULTRASOUND RIGHT BREAST LIMITED
1 series · 4 of 4 positions shown · non-contrast
Comparison: Previous exam(s).

CLINICAL DATA: 57-year-old patient presents for annual examination
of both breasts and follow-up the right breast after a benign biopsy
in the [DATE] retroareolar right region.

EXAM:
DIGITAL DIAGNOSTIC BILATERAL MAMMOGRAM WITH CAD AND TOMO
ULTRASOUND RIGHT BREAST

[Series 1: ultrasound right breast limited · 0.06mm/px · 4 of 4 slices shown]
[im 1/4]
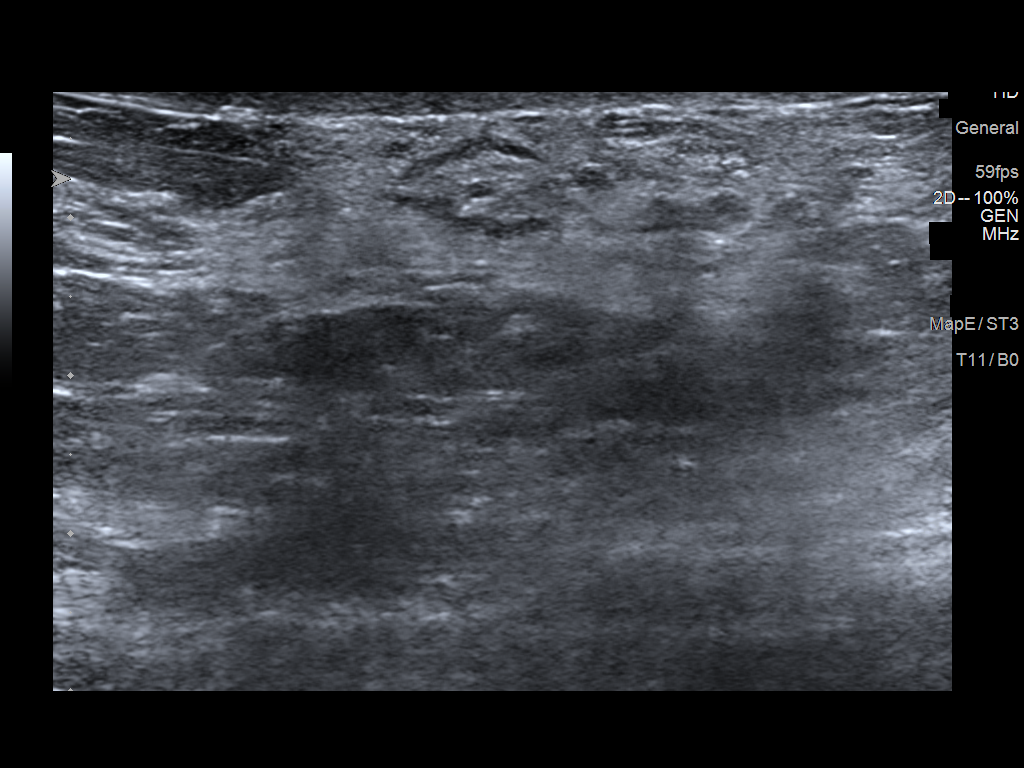
[im 2/4]
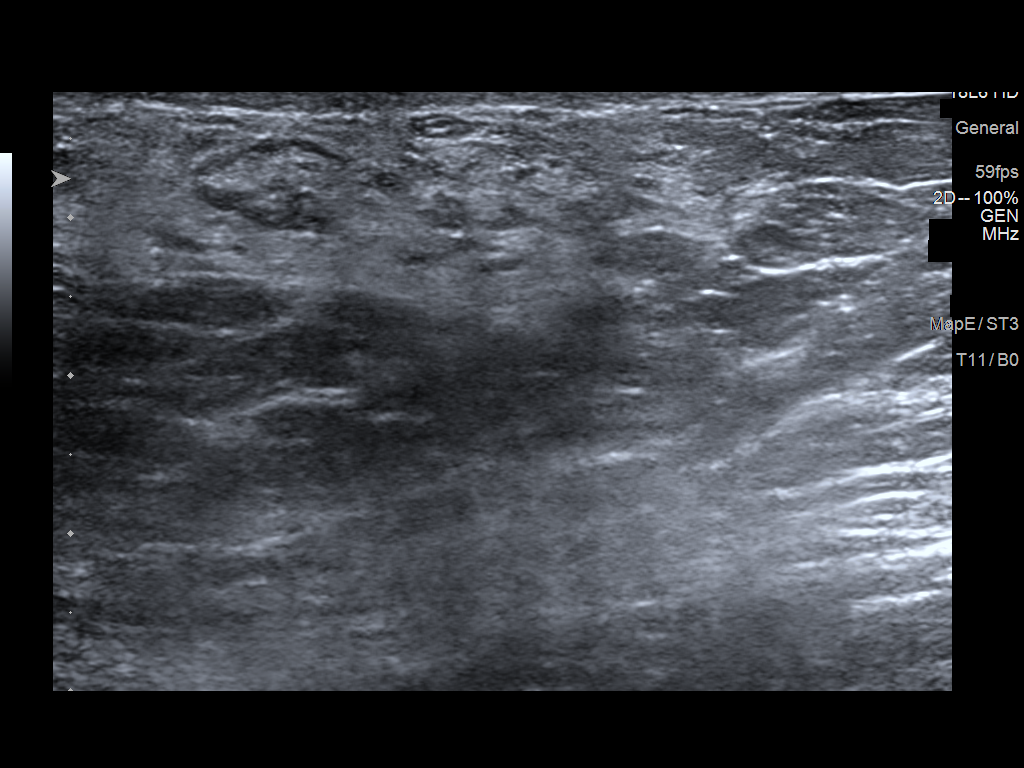
[im 3/4]
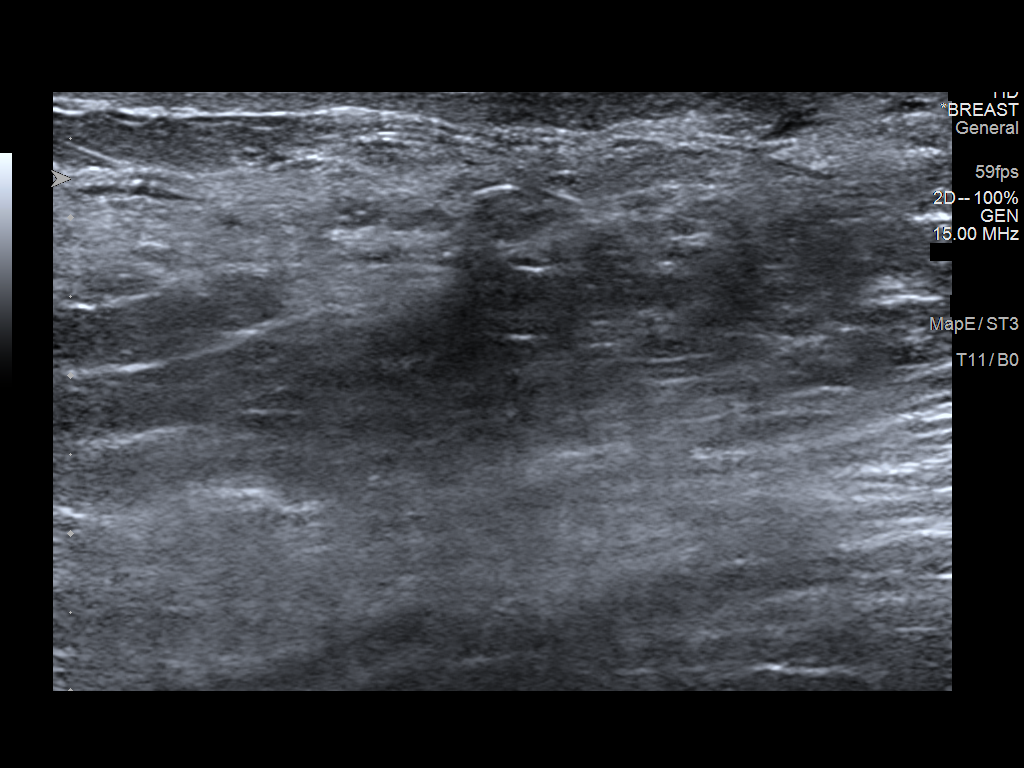
[im 4/4]
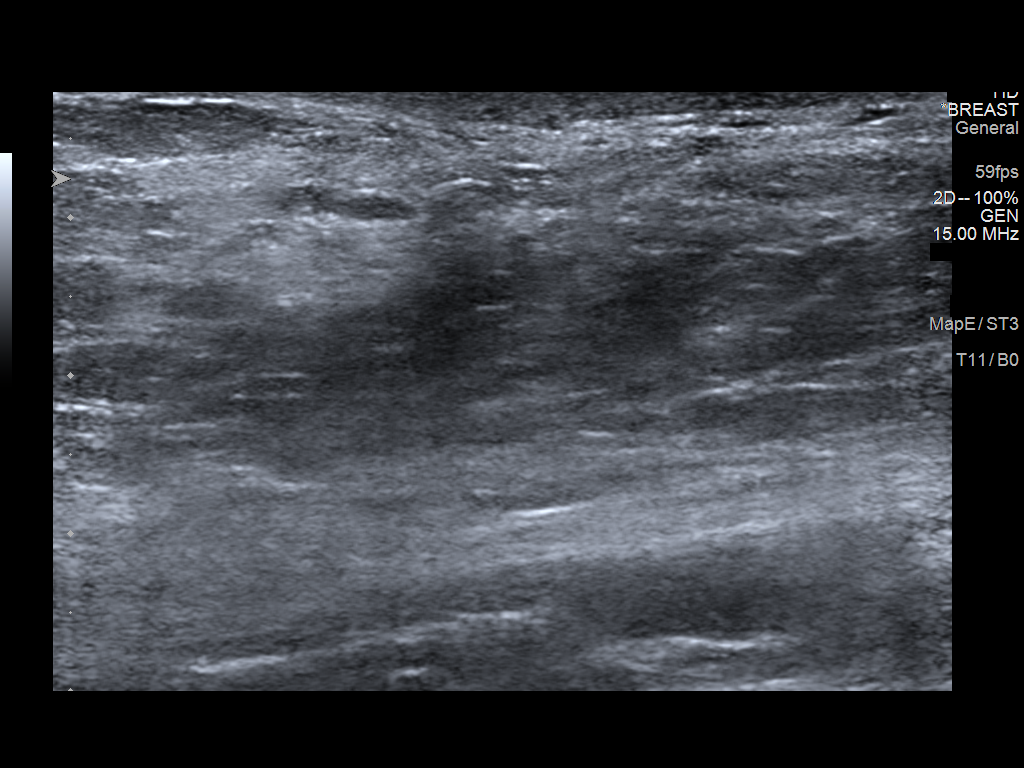

[4 of 4 positions shown; findings below may reference images not displayed]

ACR Breast Density Category b: There are scattered areas of
fibroglandular density.
FINDINGS: Ribbon shaped biopsy clip in the outer retroareolar right breast. No
mass, architectural distortion, or suspicious microcalcification is
identified in either breast to suggest malignancy.

Mammographic images were processed with CAD.

Targeted ultrasound is performed, showing the biopsy clip in the
[DATE] retroareolar right breast on ultrasound. There is no mass or
abnormal shadowing in this region. No suspicious findings on
ultrasound.
IMPRESSION: No evidence of malignancy in either breast.

RECOMMENDATION:
Screening mammogram in one year.(Code:7S-1-NUF)

I have discussed the findings and recommendations with the patient.
Results were also provided in writing at the conclusion of the
visit. If applicable, a reminder letter will be sent to the patient
regarding the next appointment.

BI-RADS CATEGORY  1: Negative.

## 2020-06-03 ENCOUNTER — Other Ambulatory Visit: Payer: Self-pay

## 2020-06-03 ENCOUNTER — Ambulatory Visit: Payer: 59

## 2020-06-03 ENCOUNTER — Ambulatory Visit
Admission: RE | Admit: 2020-06-03 | Discharge: 2020-06-03 | Disposition: A | Discharge status: Home / Self Care | Payer: 59 | Source: Ambulatory Visit | Attending: Adult Health | Admitting: Adult Health

## 2020-06-03 DIAGNOSIS — R928 Other abnormal and inconclusive findings on diagnostic imaging of breast: Secondary | ICD-10-CM | POA: Diagnosis not present

## 2020-06-03 DIAGNOSIS — N644 Mastodynia: Secondary | ICD-10-CM

## 2020-06-03 DIAGNOSIS — N6489 Other specified disorders of breast: Secondary | ICD-10-CM | POA: Diagnosis not present

## 2020-07-15 ENCOUNTER — Ambulatory Visit
Admission: RE | Admit: 2020-07-15 | Discharge: 2020-07-15 | Disposition: A | Discharge status: Home / Self Care | Payer: 59 | Source: Ambulatory Visit | Attending: Adult Health | Admitting: Adult Health

## 2020-07-15 ENCOUNTER — Other Ambulatory Visit: Payer: 59

## 2020-07-15 DIAGNOSIS — Z800 Family history of malignant neoplasm of digestive organs: Secondary | ICD-10-CM | POA: Diagnosis not present

## 2020-07-15 DIAGNOSIS — Z9049 Acquired absence of other specified parts of digestive tract: Secondary | ICD-10-CM | POA: Diagnosis not present

## 2020-07-15 DIAGNOSIS — Z803 Family history of malignant neoplasm of breast: Secondary | ICD-10-CM | POA: Diagnosis not present

## 2020-07-15 DIAGNOSIS — Z8041 Family history of malignant neoplasm of ovary: Secondary | ICD-10-CM | POA: Diagnosis not present

## 2020-07-15 MED ORDER — IOPAMIDOL (ISOVUE-300) INJECTION 61%
100.0000 mL | Freq: Once | INTRAVENOUS | Status: AC | PRN
  Administered 2020-07-15: 100 mL via INTRAVENOUS

## 2020-07-22 ENCOUNTER — Encounter: Payer: Self-pay | Admitting: Adult Health

## 2020-07-31 ENCOUNTER — Encounter: Payer: 59 | Admitting: Adult Health

## 2020-07-31 NOTE — Progress Notes (Deleted)
Subjective:    Patient ID: Karen Pope, female    DOB: 02/01/1961, 59 y.o.   MRN: [id number]  HPI Patient presents for yearly preventative medicine examination. She is a pleasant 59 year old female who  has a past medical history of Allergy, Arthritis, Cataract, Claustrophobia, essential hypertension, Lump or mass in breast, Obesity, unspecified, Other disorder of menstruation and other abnormal bleeding from female genital tract, Other speech disturbance(784.59), Urinary incontinence, and Urticaria, unspecified.  Essential hypertension-takes Norvasc 2.5 mg twice daily and HCTZ for fluid retention as needed. She denies dizziness, lightheadedness, nose pain, shortness of breath, headaches, or syncopal episodes.  Anxiety and depression-currently prescribed Lexapro 20 mg daily Xanax 0.25 mg nightly as needed. She does feel well controlled on this medication.   - takes Vitamin D 2000 units daily.  Last vitamiVitamin D Deficiencyn D Lab Results  Component Value Date   VD25OH 23.64 (L) 07/31/2019    All immunizations and health maintenance protocols were reviewed with the patient and needed orders were placed.  Appropriate screening laboratory values were ordered for the patient including screening of hyperlipidemia, renal function and hepatic function.  Medication reconciliation,  past medical history, social history, problem list and allergies were reviewed in detail with the patient  Goals were established with regard to weight loss, exercise, and  diet in compliance with medications Wt Readings from Last 3 Encounters:  05/20/20 227 lb (103 kg)  10/28/19 221 lb (100.2 kg)  10/17/19 221 lb (100.2 kg)    She is up-to-date on routine colon cancer screening, mammograms, and GYN care.  Review of Systems  Constitutional: Negative.   HENT: Negative.   Eyes: Negative.   Respiratory: Negative.   Cardiovascular: Negative.   Gastrointestinal: Negative.   Endocrine: Negative.     Genitourinary: Negative.   Musculoskeletal: Negative.   Skin: Negative.   Allergic/Immunologic: Negative.   Neurological: Negative.   Hematological: Negative.   Psychiatric/Behavioral: Negative.    Past Medical History:  Diagnosis Date  . Allergy   . Arthritis    knees  . Cataract    forming both eyes  . Claustrophobia   . essential hypertension   . Lump or mass in breast   . Obesity, unspecified   . Other disorder of menstruation and other abnormal bleeding from female genital tract   . Other speech disturbance(784.59)   . Urinary incontinence   . Urticaria, unspecified     Social History   Socioeconomic History  . Marital status: Married    Spouse name: Not on file  . Number of children: Not on file  . Years of education: Not on file  . Highest education level: Not on file  Occupational History  . Not on file  Tobacco Use  . Smoking status: Never Smoker  . Smokeless tobacco: Never Used  Vaping Use  . Vaping Use: Never used  Substance and Sexual Activity  . Alcohol use: Yes    Comment: rare- 1 x q 4 months  . Drug use: No  . Sexual activity: Not on file  Other Topics Concern  . Not on file  Social History Narrative  . Not on file   Social Determinants of Health   Financial Resource Strain:   . Difficulty of Paying Living Expenses: Not on file  Food Insecurity:   . Worried About Charity fundraiser in the Last Year: Not on file  . Ran Out of Food in the Last Year: Not on file  Transportation Needs:   .  Lack of Transportation (Medical): Not on file  . Lack of Transportation (Non-Medical): Not on file  Physical Activity:   . Days of Exercise per Week: Not on file  . Minutes of Exercise per Session: Not on file  Stress:   . Feeling of Stress : Not on file  Social Connections:   . Frequency of Communication with Friends and Family: Not on file  . Frequency of Social Gatherings with Friends and Family: Not on file  . Attends Religious Services: Not on  file  . Active Member of Clubs or Organizations: Not on file  . Attends Archivist Meetings: Not on file  . Marital Status: Not on file  Intimate Partner Violence:   . Fear of Current or Ex-Partner: Not on file  . Emotionally Abused: Not on file  . Physically Abused: Not on file  . Sexually Abused: Not on file    Past Surgical History:  Procedure Laterality Date  . BREAST BIOPSY     benign   . CHOLECYSTECTOMY N/A 10/07/2018   Procedure: LAPAROSCOPIC CHOLECYSTECTOMY WITH INTRAOPERATIVE CHOLANGIOGRAM;  Surgeon: Armandina Gemma, MD;  Location: [facility];  Service: General;  Laterality: N/A;  . COLONOSCOPY    . TONSILLECTOMY  1979  . TUBAL LIGATION  1997  . WISDOM TOOTH EXTRACTION     1980's    Family History  Problem Relation Age of Onset  . Breast cancer Mother 65  . Leukemia Maternal Aunt        leukemia- type cancer  . Cancer Paternal Aunt        either uterine or cervical cancer  . Colon cancer Other        in his 21's  . Prostate cancer Maternal Uncle   . Pancreatic cancer Other        in his 24's  . Prostate cancer Half-Brother   . Pancreatic cancer Cousin        79   . Colon polyps Neg Hx   . Esophageal cancer Neg Hx   . Rectal cancer Neg Hx   . Stomach cancer Neg Hx     Allergies  Allergen Reactions  . Lisinopril Swelling  . Thimerosal Itching    Current Outpatient Medications on File Prior to Visit  Medication Sig Dispense Refill  . acetaminophen (TYLENOL) 500 MG tablet Take 1,000 mg by mouth every 6 (six) hours as needed for moderate pain.    Marland Kitchen albuterol (PROVENTIL HFA;VENTOLIN HFA) 108 (90 Base) MCG/ACT inhaler Inhale 2 puffs into the lungs every 6 (six) hours as needed. 8 g 0  . ALPRAZolam (XANAX) 0.25 MG tablet Take 1 tablet (0.25 mg total) by mouth at bedtime. 30 tablet 0  . amLODipine (NORVASC) 5 MG tablet TAKE 1/2 TABLET(2.5 MG) BY MOUTH TWICE DAILY 90 tablet 3  . Cholecalciferol (VITAMIN D3) 2000 units TABS Take 2,000 Units by mouth every  other day.     . escitalopram (LEXAPRO) 20 MG tablet TAKE 1 TABLET(20 MG) BY MOUTH DAILY 90 tablet 1  . hydrochlorothiazide (HYDRODIURIL) 12.5 MG tablet Take 1 tablet (12.5 mg total) by mouth daily. 90 tablet 3  . ibuprofen (ADVIL,MOTRIN) 200 MG tablet Take 200-400 mg by mouth every 6 (six) hours as needed for moderate pain.    . Multiple Vitamin (MULTIVITAMIN) tablet Take 1 tablet by mouth daily.    Marland Kitchen nystatin-triamcinolone (MYCOLOG II) cream APP EXT AA BID FOR 7 TO 14 DAYS    . Polyvinyl Alcohol-Povidone (REFRESH OP) Apply to eye.  No current facility-administered medications on file prior to visit.    LMP 10/27/2019 (Exact Date)       Objective:   Physical Exam Vitals and nursing note reviewed.  Constitutional:      General: She is not in acute distress.    Appearance: Normal appearance. She is well-developed. She is not ill-appearing.  HENT:     Head: Normocephalic and atraumatic.     Right Ear: Tympanic membrane, ear canal and external ear normal. There is no impacted cerumen.     Left Ear: Tympanic membrane, ear canal and external ear normal. There is no impacted cerumen.     Nose: Nose normal. No congestion or rhinorrhea.     Mouth/Throat:     Mouth: Mucous membranes are moist.     Pharynx: Oropharynx is clear. No oropharyngeal exudate or posterior oropharyngeal erythema.  Eyes:     General:        Right eye: No discharge.        Left eye: No discharge.     Extraocular Movements: Extraocular movements intact.     Conjunctiva/sclera: Conjunctivae normal.     Pupils: Pupils are equal, round, and reactive to light.  Neck:     Thyroid: No thyromegaly.     Vascular: No carotid bruit.     Trachea: No tracheal deviation.  Cardiovascular:     Rate and Rhythm: Normal rate and regular rhythm.     Pulses: Normal pulses.     Heart sounds: Normal heart sounds. No murmur heard.  No friction rub. No gallop.   Pulmonary:     Effort: Pulmonary effort is normal. No respiratory  distress.     Breath sounds: Normal breath sounds. No stridor. No wheezing, rhonchi or rales.  Chest:     Chest wall: No tenderness.  Abdominal:     General: Abdomen is flat. Bowel sounds are normal. There is no distension.     Palpations: Abdomen is soft. There is no mass.     Tenderness: There is no abdominal tenderness. There is no right CVA tenderness, left CVA tenderness, guarding or rebound.     Hernia: No hernia is present.  Musculoskeletal:        General: No swelling, tenderness, deformity or signs of injury. Normal range of motion.     Cervical back: Normal range of motion and neck supple.     Right lower leg: No edema.     Left lower leg: No edema.  Lymphadenopathy:     Cervical: No cervical adenopathy.  Skin:    General: Skin is warm and dry.     Coloration: Skin is not jaundiced or pale.     Findings: No bruising, erythema, lesion or rash.  Neurological:     General: No focal deficit present.     Mental Status: She is alert and oriented to person, place, and time.     Cranial Nerves: No cranial nerve deficit.     Sensory: No sensory deficit.     Motor: No weakness.     Coordination: Coordination normal.     Gait: Gait normal.     Deep Tendon Reflexes: Reflexes normal.  Psychiatric:        Mood and Affect: Mood normal.        Behavior: Behavior normal.        Thought Content: Thought content normal.        Judgment: Judgment normal.       Assessment & Plan:

## 2020-08-19 ENCOUNTER — Other Ambulatory Visit: Payer: Self-pay | Admitting: Adult Health

## 2020-08-19 DIAGNOSIS — I10 Essential (primary) hypertension: Secondary | ICD-10-CM

## 2020-08-19 DIAGNOSIS — F419 Anxiety disorder, unspecified: Secondary | ICD-10-CM

## 2020-09-02 ENCOUNTER — Telehealth: Payer: Self-pay | Admitting: Adult Health

## 2020-09-02 NOTE — Telephone Encounter (Signed)
Spoke to pt on the phone and r/s her appt for a sooner date.  I advised her that she may want to go to the urgent care or ED--she verbalized understanding of this.

## 2020-09-04 ENCOUNTER — Ambulatory Visit: Payer: 59 | Admitting: Internal Medicine

## 2020-09-04 ENCOUNTER — Ambulatory Visit
Admission: RE | Admit: 2020-09-04 | Discharge: 2020-09-04 | Disposition: A | Discharge status: Home / Self Care | Payer: 59 | Source: Ambulatory Visit | Attending: Internal Medicine | Admitting: Internal Medicine

## 2020-09-04 ENCOUNTER — Other Ambulatory Visit: Payer: Self-pay

## 2020-09-04 ENCOUNTER — Encounter: Payer: Self-pay | Admitting: Internal Medicine

## 2020-09-04 ENCOUNTER — Other Ambulatory Visit: Payer: Self-pay | Admitting: Internal Medicine

## 2020-09-04 VITALS — BP 110/80 | HR 67 | Temp 98.1°F | Wt 227.7 lb

## 2020-09-04 DIAGNOSIS — R2681 Unsteadiness on feet: Secondary | ICD-10-CM

## 2020-09-04 DIAGNOSIS — R519 Headache, unspecified: Secondary | ICD-10-CM | POA: Diagnosis not present

## 2020-09-04 DIAGNOSIS — R4789 Other speech disturbances: Secondary | ICD-10-CM

## 2020-09-04 NOTE — Progress Notes (Signed)
Acute Office Visit     This visit occurred during the SARS-CoV-2 public health emergency.  Safety protocols were in place, including screening questions prior to the visit, additional usage of staff PPE, and extensive cleaning of exam room while observing appropriate contact time as indicated for disinfecting solutions.    CC/Reason for Visit: "Worst headache ever"  HPI: Karen Pope is a 59 y.o. female who is coming in today for the above mentioned reasons.  For the past few months she has been having daily headaches.  3 weeks ago, during sexual intercourse, she had the sudden onset of severe frontal headache that lasted about 10 minutes.  She describes it as the "worst headache she has ever had" she had some vision disturbances/blurry vision at that time.  No nausea or vomiting.  Ever since then she continues to have a daily dull headache, word finding difficulty, significant gait imbalance and instability and also twice she has gotten lost while driving routes that she is very accustomed to driving.  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  She denies weakness or focal neurologic deficits.   Past Medical/Surgical History: Past Medical History:  Diagnosis Date  . Allergy   . Arthritis    knees  . Cataract    forming both eyes  . Claustrophobia   . essential hypertension   . Lump or mass in breast   . Obesity, unspecified   . Other disorder of menstruation and other abnormal bleeding from female genital tract   . Other speech disturbance(784.59)   . Urinary incontinence   . Urticaria, unspecified     Past Surgical History:  Procedure Laterality Date  . BREAST BIOPSY     benign   . CHOLECYSTECTOMY N/A 10/07/2018   Procedure: LAPAROSCOPIC CHOLECYSTECTOMY WITH INTRAOPERATIVE CHOLANGIOGRAM;  Surgeon: Armandina Gemma, MD;  Location: [facility];  Service: General;  Laterality: N/A;  . COLONOSCOPY    . TONSILLECTOMY  1979  . TUBAL LIGATION  1997    . WISDOM TOOTH EXTRACTION     1980's    Social History:  reports that she has never smoked. She has never used smokeless tobacco. She reports current alcohol use. She reports that she does not use drugs.  Allergies: Allergies  Allergen Reactions  . Lisinopril Swelling  . Thimerosal Itching    Family History:  Family History  Problem Relation Age of Onset  . Breast cancer Mother 45  . Leukemia Maternal Aunt        leukemia- type cancer  . Cancer Paternal Aunt        either uterine or cervical cancer  . Colon cancer Other        in his 37's  . Prostate cancer Maternal Uncle   . Pancreatic cancer Other        in his 73's  . Prostate cancer Half-Brother   . Pancreatic cancer Cousin        50   . Colon polyps Neg Hx   . Esophageal cancer Neg Hx   . Rectal cancer Neg Hx   . Stomach cancer Neg Hx      Current Outpatient Medications:  .  acetaminophen (TYLENOL) 500 MG tablet, Take 1,000 mg by mouth every 6 (six) hours as needed for moderate pain., Disp: , Rfl:  .  albuterol (PROVENTIL HFA;VENTOLIN HFA) 108 (90 Base) MCG/ACT inhaler, Inhale 2 puffs into the lungs every 6 (six) hours as needed., Disp: 8  g, Rfl: 0 .  ALPRAZolam (XANAX) 0.25 MG tablet, Take 1 tablet (0.25 mg total) by mouth at bedtime., Disp: 30 tablet, Rfl: 0 .  amLODipine (NORVASC) 5 MG tablet, TAKE 1/2 TABLET(2.5 MG) BY MOUTH TWICE DAILY, Disp: 90 tablet, Rfl: 3 .  Cholecalciferol (VITAMIN D3) 2000 units TABS, Take 2,000 Units by mouth every other day. , Disp: , Rfl:  .  escitalopram (LEXAPRO) 20 MG tablet, TAKE 1 TABLET(20 MG) BY MOUTH DAILY, Disp: 90 tablet, Rfl: 1 .  hydrochlorothiazide (HYDRODIURIL) 12.5 MG tablet, TAKE 1 TABLET(12.5 MG) BY MOUTH DAILY, Disp: 90 tablet, Rfl: 3 .  ibuprofen (ADVIL,MOTRIN) 200 MG tablet, Take 200-400 mg by mouth every 6 (six) hours as needed for moderate pain., Disp: , Rfl:  .  Multiple Vitamin (MULTIVITAMIN) tablet, Take 1 tablet by mouth daily., Disp: , Rfl:  .   nystatin-triamcinolone (MYCOLOG II) cream, APP EXT AA BID FOR 7 TO 14 DAYS, Disp: , Rfl:  .  Polyvinyl Alcohol-Povidone (REFRESH OP), Apply to eye., Disp: , Rfl:   Review of Systems:  Constitutional: Denies fever, chills, diaphoresis, appetite change and fatigue.  HEENT: Denies photophobia, eye pain, redness, hearing loss, ear pain, congestion, sore throat, rhinorrhea, sneezing, mouth sores, trouble swallowing, neck pain, neck stiffness and tinnitus.   Respiratory: Denies SOB, DOE, cough, chest tightness,  and wheezing.   Cardiovascular: Denies chest pain, palpitations and leg swelling.  Gastrointestinal: Denies nausea, vomiting, abdominal pain, diarrhea, constipation, blood in stool and abdominal distention.  Genitourinary: Denies dysuria, urgency, frequency, hematuria, flank pain and difficulty urinating.  Endocrine: Denies: hot or cold intolerance, sweats, changes in hair or nails, polyuria, polydipsia. Musculoskeletal: Denies myalgias, back pain, joint swelling, arthralgias and gait problem.  Skin: Denies pallor, rash and wound.  Neurological: Denies dizziness, seizures, syncope, weakness, light-headedness. Hematological: Denies adenopathy. Easy bruising, personal or family bleeding history  Psychiatric/Behavioral: Denies suicidal ideation, mood changes, confusion, nervousness, sleep disturbance and agitation    Physical Exam: Vitals:   09/04/20 0839  BP: 110/80  Pulse: 67  Temp: 98.1 F (36.7 C)  TempSrc: Oral  SpO2: 97%  Weight: 227 lb 11.2 oz (103.3 kg)    Body mass index is 36.75 kg/m.   Constitutional: NAD, calm, comfortable Eyes: PERRL, lids and conjunctivae normal, wears corrective lenses ENMT: Mucous membranes are moist.  Respiratory: clear to auscultation bilaterally, no wheezing, no crackles. Normal respiratory effort. No accessory muscle use.  Cardiovascular: Regular rate and rhythm, no murmurs / rubs / gallops. No extremity edema.  Neurologic: CN 2-12 grossly  intact. Sensation intact, DTR normal. Strength 5/5 in all 4.  Psychiatric: Normal judgment and insight. Alert and oriented x 3. Normal mood.    Impression and Plan:  Worsening headaches Word finding difficulty  Gait instability  -With the sudden onset of severe, "worst headache ever" I am very concerned for subarachnoid hemorrhage especially with word finding difficulty, gait imbalance and her getting lost on routes that she frequently travels. -I will send her today for a stat CT scan of the head, if normal I think we should do an MRI of the brain as a follow-up. -I will send this to her primary care provider for follow up and further work-up.     Lelon Frohlich, MD [facility]

## 2020-09-07 ENCOUNTER — Encounter: Payer: Self-pay | Admitting: Internal Medicine

## 2020-09-07 ENCOUNTER — Other Ambulatory Visit: Payer: Self-pay | Admitting: Adult Health

## 2020-09-07 DIAGNOSIS — R4789 Other speech disturbances: Secondary | ICD-10-CM

## 2020-09-07 DIAGNOSIS — H35371 Puckering of macula, right eye: Secondary | ICD-10-CM | POA: Diagnosis not present

## 2020-09-07 DIAGNOSIS — H532 Diplopia: Secondary | ICD-10-CM | POA: Diagnosis not present

## 2020-09-07 DIAGNOSIS — H5022 Vertical strabismus, left eye: Secondary | ICD-10-CM | POA: Diagnosis not present

## 2020-09-07 DIAGNOSIS — R2681 Unsteadiness on feet: Secondary | ICD-10-CM

## 2020-09-07 DIAGNOSIS — H2513 Age-related nuclear cataract, bilateral: Secondary | ICD-10-CM | POA: Diagnosis not present

## 2020-09-07 DIAGNOSIS — H16223 Keratoconjunctivitis sicca, not specified as Sjogren's, bilateral: Secondary | ICD-10-CM | POA: Diagnosis not present

## 2020-09-07 DIAGNOSIS — R519 Headache, unspecified: Secondary | ICD-10-CM

## 2020-09-08 ENCOUNTER — Other Ambulatory Visit: Payer: Self-pay | Admitting: Internal Medicine

## 2020-09-08 DIAGNOSIS — F419 Anxiety disorder, unspecified: Secondary | ICD-10-CM

## 2020-09-08 MED ORDER — LORAZEPAM 1 MG PO TABS: ORAL_TABLET | ORAL | 0 refills | Status: DC

## 2020-09-11 ENCOUNTER — Encounter: Payer: Self-pay | Admitting: Adult Health

## 2020-09-16 ENCOUNTER — Ambulatory Visit: Payer: 59 | Admitting: Adult Health

## 2020-10-04 ENCOUNTER — Other Ambulatory Visit: Payer: Self-pay

## 2020-10-04 ENCOUNTER — Ambulatory Visit
Admission: RE | Admit: 2020-10-04 | Discharge: 2020-10-04 | Disposition: A | Discharge status: Home / Self Care | Payer: 59 | Source: Ambulatory Visit | Attending: Internal Medicine | Admitting: Internal Medicine

## 2020-10-04 DIAGNOSIS — R519 Headache, unspecified: Secondary | ICD-10-CM

## 2020-10-04 DIAGNOSIS — D329 Benign neoplasm of meninges, unspecified: Secondary | ICD-10-CM | POA: Diagnosis not present

## 2020-10-04 MED ORDER — GADOBENATE DIMEGLUMINE 529 MG/ML IV SOLN
20.0000 mL | Freq: Once | INTRAVENOUS | Status: AC | PRN
  Administered 2020-10-04: 20 mL via INTRAVENOUS

## 2020-10-07 DIAGNOSIS — D496 Neoplasm of unspecified behavior of brain: Secondary | ICD-10-CM | POA: Diagnosis not present

## 2020-10-07 DIAGNOSIS — Z6835 Body mass index (BMI) 35.0-35.9, adult: Secondary | ICD-10-CM | POA: Diagnosis not present

## 2020-10-21 DIAGNOSIS — H16223 Keratoconjunctivitis sicca, not specified as Sjogren's, bilateral: Secondary | ICD-10-CM | POA: Diagnosis not present

## 2020-10-21 DIAGNOSIS — H532 Diplopia: Secondary | ICD-10-CM | POA: Diagnosis not present

## 2020-10-21 DIAGNOSIS — H2513 Age-related nuclear cataract, bilateral: Secondary | ICD-10-CM | POA: Diagnosis not present

## 2020-10-21 DIAGNOSIS — H43811 Vitreous degeneration, right eye: Secondary | ICD-10-CM | POA: Diagnosis not present

## 2020-10-26 ENCOUNTER — Other Ambulatory Visit: Payer: Self-pay | Admitting: Adult Health

## 2020-10-26 DIAGNOSIS — I10 Essential (primary) hypertension: Secondary | ICD-10-CM

## 2020-12-25 ENCOUNTER — Encounter: Payer: 59 | Admitting: Adult Health

## 2020-12-25 NOTE — Progress Notes (Deleted)
Subjective:    Patient ID: Karen Pope, female    DOB: [birth year], 60 y.o.   MRN: [id number]  HPI Patient presents for yearly preventative medicine examination. She is a pleasant 60 year old female who  has a past medical history of Allergy, Arthritis, Cataract, Claustrophobia, essential hypertension, Lump or mass in breast, Obesity, unspecified, Other disorder of menstruation and other abnormal bleeding from female genital tract, Other speech disturbance(784.59), Urinary incontinence, and Urticaria, unspecified.  Hypertension-Norvasc 2.5 mg twice daily and hydrochlorothiazide1 2.5 mg as needed for fluid retention.  She denies dizziness, lightheadedness, chest pain, or shortness of breath.  Anxiety/depression-is currently prescribed Lexapro 20 mg daily.  She does report good control of her symptoms with this medication.  Denies depressive symptoms, panic attacks, or suicidal ideation  Headaches -she was seen by another provider in September 2021 for daily headaches and present for at least a few months prior.  At this time other associated symptoms included word finding difficulty, significant gait imbalance, and instability as well as 2 episodes of getting lost while driving on routes that she was accustomed to.  She had a stat CT done which showed a small right para falcine meningioma.  A follow-up MRI of the brain which showed no acute intercranial abnormality but again showed a small right parafalcine meningioma   All immunizations and health maintenance protocols were reviewed with the patient and needed orders were placed.  Appropriate screening laboratory values were ordered for the patient including screening of hyperlipidemia, renal function and hepatic function.  Medication reconciliation,  past medical history, social history, problem list and allergies were reviewed in detail with the patient  Goals were established with regard to weight loss, exercise, and  diet in compliance  with medications Wt Readings from Last 3 Encounters:  09/04/20 227 lb 11.2 oz (103.3 kg)  05/20/20 227 lb (103 kg)  10/28/19 221 lb (100.2 kg)    She is up-to-date on routine GYN care, mammogram, and routine colon cancer screening.   Review of Systems  Constitutional: Negative.   HENT: Negative.   Eyes: Negative.   Respiratory: Negative.   Cardiovascular: Negative.   Gastrointestinal: Negative.   Endocrine: Negative.   Genitourinary: Negative.   Musculoskeletal: Negative.   Skin: Negative.   Allergic/Immunologic: Negative.   Neurological: Negative.   Hematological: Negative.   Psychiatric/Behavioral: Negative.    Past Medical History:  Diagnosis Date  . Allergy   . Arthritis    knees  . Cataract    forming both eyes  . Claustrophobia   . essential hypertension   . Lump or mass in breast   . Obesity, unspecified   . Other disorder of menstruation and other abnormal bleeding from female genital tract   . Other speech disturbance(784.59)   . Urinary incontinence   . Urticaria, unspecified     Social History   Socioeconomic History  . Marital status: Married    Spouse name: Not on file  . Number of children: Not on file  . Years of education: Not on file  . Highest education level: Not on file  Occupational History  . Not on file  Tobacco Use  . Smoking status: Never Smoker  . Smokeless tobacco: Never Used  Vaping Use  . Vaping Use: Never used  Substance and Sexual Activity  . Alcohol use: Yes    Comment: rare- 1 x q 4 months  . Drug use: No  . Sexual activity: Not on file  Other Topics Concern  .  Not on file  Social History Narrative  . Not on file   Social Determinants of Health   Financial Resource Strain: Not on file  Food Insecurity: Not on file  Transportation Needs: Not on file  Physical Activity: Not on file  Stress: Not on file  Social Connections: Not on file  Intimate Partner Violence: Not on file    Past Surgical History:   Procedure Laterality Date  . BREAST BIOPSY     benign   . CHOLECYSTECTOMY N/A 10/07/2018   Procedure: LAPAROSCOPIC CHOLECYSTECTOMY WITH INTRAOPERATIVE CHOLANGIOGRAM;  Surgeon: Armandina Gemma, MD;  Location: [facility];  Service: General;  Laterality: N/A;  . COLONOSCOPY    . TONSILLECTOMY  1979  . TUBAL LIGATION  1997  . WISDOM TOOTH EXTRACTION     1980's    Family History  Problem Relation Age of Onset  . Breast cancer Mother 61  . Leukemia Maternal Aunt        leukemia- type cancer  . Cancer Paternal Aunt        either uterine or cervical cancer  . Colon cancer Other        in his 41's  . Prostate cancer Maternal Uncle   . Pancreatic cancer Other        in his 94's  . Prostate cancer Half-Brother   . Pancreatic cancer Cousin        43   . Colon polyps Neg Hx   . Esophageal cancer Neg Hx   . Rectal cancer Neg Hx   . Stomach cancer Neg Hx     Allergies  Allergen Reactions  . Lisinopril Swelling  . Thimerosal Itching    Current Outpatient Medications on File Prior to Visit  Medication Sig Dispense Refill  . acetaminophen (TYLENOL) 500 MG tablet Take 1,000 mg by mouth every 6 (six) hours as needed for moderate pain.    Marland Kitchen albuterol (PROVENTIL HFA;VENTOLIN HFA) 108 (90 Base) MCG/ACT inhaler Inhale 2 puffs into the lungs every 6 (six) hours as needed. 8 g 0  . ALPRAZolam (XANAX) 0.25 MG tablet Take 1 tablet (0.25 mg total) by mouth at bedtime. 30 tablet 0  . amLODipine (NORVASC) 5 MG tablet TAKE 1/2 TABLET(2.5 MG) BY MOUTH TWICE DAILY 90 tablet 0  . Cholecalciferol (VITAMIN D3) 2000 units TABS Take 2,000 Units by mouth every other day.     . escitalopram (LEXAPRO) 20 MG tablet TAKE 1 TABLET(20 MG) BY MOUTH DAILY 90 tablet 1  . hydrochlorothiazide (HYDRODIURIL) 12.5 MG tablet TAKE 1 TABLET(12.5 MG) BY MOUTH DAILY 90 tablet 3  . ibuprofen (ADVIL,MOTRIN) 200 MG tablet Take 200-400 mg by mouth every 6 (six) hours as needed for moderate pain.    . Multiple Vitamin (MULTIVITAMIN)  tablet Take 1 tablet by mouth daily.    Marland Kitchen nystatin-triamcinolone (MYCOLOG II) cream APP EXT AA BID FOR 7 TO 14 DAYS    . Polyvinyl Alcohol-Povidone (REFRESH OP) Apply to eye.     No current facility-administered medications on file prior to visit.    LMP 10/27/2019 (Exact Date)       Objective:   Physical Exam Vitals and nursing note reviewed.  Constitutional:      General: She is not in acute distress.    Appearance: Normal appearance. She is well-developed. She is not ill-appearing.  HENT:     Head: Normocephalic and atraumatic.     Right Ear: Tympanic membrane, ear canal and external ear normal. There is no impacted cerumen.  Left Ear: Tympanic membrane, ear canal and external ear normal. There is no impacted cerumen.     Nose: Nose normal. No congestion or rhinorrhea.     Mouth/Throat:     Mouth: Mucous membranes are moist.     Pharynx: Oropharynx is clear. No oropharyngeal exudate or posterior oropharyngeal erythema.  Eyes:     General:        Right eye: No discharge.        Left eye: No discharge.     Extraocular Movements: Extraocular movements intact.     Conjunctiva/sclera: Conjunctivae normal.     Pupils: Pupils are equal, round, and reactive to light.  Neck:     Thyroid: No thyromegaly.     Vascular: No carotid bruit.     Trachea: No tracheal deviation.  Cardiovascular:     Rate and Rhythm: Normal rate and regular rhythm.     Pulses: Normal pulses.     Heart sounds: Normal heart sounds. No murmur heard. No friction rub. No gallop.   Pulmonary:     Effort: Pulmonary effort is normal. No respiratory distress.     Breath sounds: Normal breath sounds. No stridor. No wheezing, rhonchi or rales.  Chest:     Chest wall: No tenderness.  Abdominal:     General: Abdomen is flat. Bowel sounds are normal. There is no distension.     Palpations: Abdomen is soft. There is no mass.     Tenderness: There is no abdominal tenderness. There is no right CVA tenderness, left  CVA tenderness, guarding or rebound.     Hernia: No hernia is present.  Musculoskeletal:        General: No swelling, tenderness, deformity or signs of injury. Normal range of motion.     Cervical back: Normal range of motion and neck supple.     Right lower leg: No edema.     Left lower leg: No edema.  Lymphadenopathy:     Cervical: No cervical adenopathy.  Skin:    General: Skin is warm and dry.     Coloration: Skin is not jaundiced or pale.     Findings: No bruising, erythema, lesion or rash.  Neurological:     General: No focal deficit present.     Mental Status: She is alert and oriented to person, place, and time.     Cranial Nerves: No cranial nerve deficit.     Sensory: No sensory deficit.     Motor: No weakness.     Coordination: Coordination normal.     Gait: Gait normal.     Deep Tendon Reflexes: Reflexes normal.  Psychiatric:        Mood and Affect: Mood normal.        Behavior: Behavior normal.        Thought Content: Thought content normal.        Judgment: Judgment normal.       Assessment & Plan:

## 2021-01-21 ENCOUNTER — Other Ambulatory Visit: Payer: Self-pay | Admitting: Adult Health

## 2021-01-21 DIAGNOSIS — I10 Essential (primary) hypertension: Secondary | ICD-10-CM

## 2021-01-21 NOTE — Telephone Encounter (Signed)
DENIED.  PT IS PAST DUE FOR CPX.

## 2021-02-26 ENCOUNTER — Other Ambulatory Visit: Payer: Self-pay | Admitting: Adult Health

## 2021-02-26 DIAGNOSIS — F419 Anxiety disorder, unspecified: Secondary | ICD-10-CM

## 2021-06-11 ENCOUNTER — Telehealth: Payer: Self-pay | Admitting: Gastroenterology

## 2021-06-11 NOTE — Telephone Encounter (Signed)
Patient  has pain at the 3:00 area of her anus.  Sometimes pain is worse with a BM, but pain is consistently there.  She will come in on 06/21/21 for appointment.  She is advised to try recticare and a stool softener if her stools are hard.

## 2021-06-11 NOTE — Telephone Encounter (Signed)
Patient calling state she is experiencing rectal pain. Pt wants to know how to treat sxs.. Plz advise  Thank you

## 2021-06-21 ENCOUNTER — Ambulatory Visit: Payer: BC Managed Care – PPO | Admitting: Physician Assistant

## 2021-06-21 ENCOUNTER — Encounter: Payer: Self-pay | Admitting: Physician Assistant

## 2021-06-21 VITALS — BP 136/70 | HR 56 | Ht 67.0 in | Wt 224.0 lb

## 2021-06-21 DIAGNOSIS — K602 Anal fissure, unspecified: Secondary | ICD-10-CM

## 2021-06-21 DIAGNOSIS — K6289 Other specified diseases of anus and rectum: Secondary | ICD-10-CM | POA: Diagnosis not present

## 2021-06-21 MED ORDER — AMBULATORY NON FORMULARY MEDICATION
0 refills | Status: DC
Start: 2021-06-21 — End: 2023-08-28

## 2021-06-21 MED ORDER — AMBULATORY NON FORMULARY MEDICATION
0 refills | Status: DC
Start: 2021-06-21 — End: 2021-06-21

## 2021-06-21 NOTE — Progress Notes (Signed)
I agree with the above note, plan 

## 2021-06-21 NOTE — Patient Instructions (Addendum)
Start Sitz Baths 2-3 times daily.  May use Reticare with Lidocaine.   We have sent a prescription for Diltiazem 2% gel to [facility] for you. Using your index finger, you should apply a small amount of medication inside the rectum up to your first knuckle/joint three times daily x 6-8 weeks.  [facility]'s information is below: Address: [address], [place], [place] [address]  [phone]  *Please DO NOT go directly from our office to pick up this medication! Give the pharmacy 1 day to process the prescription as this is compounded and takes time to make.  Follow up as needed.  If you are age 65 or older, your body mass index should be between 23-30. Your Body mass index is 35.08 kg/m. If this is out of the aforementioned range listed, please consider follow up with your Primary Care Provider.  If you are age 96 or younger, your body mass index should be between 19-25. Your Body mass index is 35.08 kg/m. If this is out of the aformentioned range listed, please consider follow up with your Primary Care Provider.   __________________________________________________________  The [facility] providers would like to encourage you to use [facility] to communicate with providers for non-urgent requests or questions.  Due to long hold times on the telephone, sending your provider a message by [facility] may be a faster and more efficient way to get a response.  Please allow 48 business hours for a response.  Please remember that this is for non-urgent requests.

## 2021-06-21 NOTE — Progress Notes (Signed)
Chief Complaint: Rectal pain  HPI:    Karen Pope is a 60 year old female with a past medical history as listed below, known to Dr. Ardis Hughs, who was referred to me by Dorothyann Peng, NP for a complaint of rectal pain.      10/30/2019 colonoscopy for screening with 2 to-3 mm polyps in the descending and transverse colon and otherwise normal.  Pathology showed tubular adenomas repeat recommended in 7 years.    06/11/2021 patient described that she had pain at the 3:00 area of her anus, sometimes worse with a bowel movement but consistently there.  She was advised to try RectiCare and a stool softener.    Today, the patient presents to clinic and tells me that for the past 2 months she has had a pain in her rectum that is right about at the 3 o'clock position if she is laying flat on her stomach.  Explains this pain seems to change in consistency sometimes it is pulsating or other times it is gnawing and deep and "nauseating".  Tells me that she feels this with some bowel movements and sometimes when she passes gas but not all the time.  Tried over-the-counter hemorrhoid cream which did not seem to make a difference.  It seems to be getting "maybe a little worse" over the past month or so.  Also describes feeling a "knot" near that area initially which seems to have gotten smaller.    Denies fever, chills or blood in her stool, constipation or diarrhea.  Past Medical History:  Diagnosis Date   Allergy    Arthritis    knees   Cataract    forming both eyes   Claustrophobia    essential hypertension    Lump or mass in breast    Obesity, unspecified    Other disorder of menstruation and other abnormal bleeding from female genital tract    Other speech disturbance(784.59)    Urinary incontinence    Urticaria, unspecified     Past Surgical History:  Procedure Laterality Date   BREAST BIOPSY     benign    CHOLECYSTECTOMY N/A 10/07/2018   Procedure: LAPAROSCOPIC CHOLECYSTECTOMY WITH  INTRAOPERATIVE CHOLANGIOGRAM;  Surgeon: Armandina Gemma, MD;  Location: [facility];  Service: General;  Laterality: N/A;   COLONOSCOPY     TONSILLECTOMY  1979   TUBAL LIGATION  1997   WISDOM TOOTH EXTRACTION     1980's    Current Outpatient Medications  Medication Sig Dispense Refill   acetaminophen (TYLENOL) 500 MG tablet Take 1,000 mg by mouth every 6 (six) hours as needed for moderate pain.     albuterol (PROVENTIL HFA;VENTOLIN HFA) 108 (90 Base) MCG/ACT inhaler Inhale 2 puffs into the lungs every 6 (six) hours as needed. 8 g 0   ALPRAZolam (XANAX) 0.25 MG tablet Take 1 tablet (0.25 mg total) by mouth at bedtime. 30 tablet 0   amLODipine (NORVASC) 5 MG tablet TAKE 1/2 TABLET(2.5 MG) BY MOUTH TWICE DAILY 90 tablet 0   Cholecalciferol (VITAMIN D3) 2000 units TABS Take 2,000 Units by mouth every other day.      escitalopram (LEXAPRO) 20 MG tablet TAKE 1 TABLET(20 MG) BY MOUTH DAILY 90 tablet 1   hydrochlorothiazide (HYDRODIURIL) 12.5 MG tablet TAKE 1 TABLET(12.5 MG) BY MOUTH DAILY 90 tablet 3   ibuprofen (ADVIL,MOTRIN) 200 MG tablet Take 200-400 mg by mouth every 6 (six) hours as needed for moderate pain.     Multiple Vitamin (MULTIVITAMIN) tablet Take 1 tablet by mouth  daily.     nystatin-triamcinolone (MYCOLOG II) cream APP EXT AA BID FOR 7 TO 14 DAYS     Polyvinyl Alcohol-Povidone (REFRESH OP) Apply to eye.     No current facility-administered medications for this visit.    Allergies as of 06/21/2021 - Review Complete 09/04/2020  Allergen Reaction Noted   Lisinopril Swelling 10/10/2012   Thimerosal Itching 10/06/2018    Family History  Problem Relation Age of Onset   Breast cancer Mother 34   Leukemia Maternal Aunt        leukemia- type cancer   Cancer Paternal Aunt        either uterine or cervical cancer   Colon cancer Other        in his 36's   Prostate cancer Maternal Uncle    Pancreatic cancer Other        in his 15's   Prostate cancer Half-Brother    Pancreatic cancer  Cousin        39    Colon polyps Neg Hx    Esophageal cancer Neg Hx    Rectal cancer Neg Hx    Stomach cancer Neg Hx     Social History   Socioeconomic History   Marital status: Married    Spouse name: Not on file   Number of children: Not on file   Years of education: Not on file   Highest education level: Not on file  Occupational History   Not on file  Tobacco Use   Smoking status: Never   Smokeless tobacco: Never  Vaping Use   Vaping Use: Never used  Substance and Sexual Activity   Alcohol use: Yes    Comment: rare- 1 x q 4 months   Drug use: No   Sexual activity: Not on file  Other Topics Concern   Not on file  Social History Narrative   Not on file   Social Determinants of Health   Financial Resource Strain: Not on file  Food Insecurity: Not on file  Transportation Needs: Not on file  Physical Activity: Not on file  Stress: Not on file  Social Connections: Not on file  Intimate Partner Violence: Not on file    Review of Systems:    Constitutional: No weight loss, fever or chills Cardiovascular: No chest pain Respiratory: No SOB  Gastrointestinal: See HPI and otherwise negative   Physical Exam:  Vital signs: BP 136/70   Pulse (!) 56   Ht 5\' 7"  (1.702 m)   Wt 224 lb (101.6 kg)   LMP 10/27/2019 (Exact Date)   SpO2 97%   BMI 35.08 kg/m    Constitutional:   Pleasant overweight AA female appears to be in NAD, Well developed, Well nourished, alert and cooperative Respiratory: Respirations even and unlabored. Lungs clear to auscultation bilaterally.   No wheezes, crackles, or rhonchi.  Cardiovascular: Normal S1, S2. No MRG. Regular rate and rhythm. No peripheral edema, cyanosis or pallor.  Gastrointestinal:  Soft, nondistended, nontender. No rebound or guarding. Normal bowel sounds. No appreciable masses or hepatomegaly. Rectal: External: Pea-sized thrombosed external hemorrhoid, slight tenderness to palpation; internal: No mass; anoscopy: Superficial  anal fissure at 9 o'clock position Psychiatric: Oriented to person, place and time. Demonstrates good judgement and reason without abnormal affect or behaviors.  RELEVANT LABS AND IMAGING: CBC    Component Value Date/Time   WBC 5.9 07/31/2019 0920   RBC 4.79 07/31/2019 0920   HGB 13.1 07/31/2019 0920   HCT 40.6 07/31/2019 0920  PLT 227.0 07/31/2019 0920   MCV 84.9 07/31/2019 0920   MCH 26.6 10/05/2018 2357   MCHC 32.1 07/31/2019 0920   RDW 14.9 07/31/2019 0920   LYMPHSABS 2.2 07/31/2019 0920   MONOABS 0.6 07/31/2019 0920   EOSABS 0.2 07/31/2019 0920   BASOSABS 0.0 07/31/2019 0920    CMP     Component Value Date/Time   NA 142 07/31/2019 0920   K 4.3 07/31/2019 0920   CL 104 07/31/2019 0920   CO2 31 07/31/2019 0920   GLUCOSE 94 07/31/2019 0920   BUN 14 07/31/2019 0920   CREATININE 0.97 07/31/2019 0920   CALCIUM 10.2 07/31/2019 0920   PROT 7.2 07/31/2019 0920   ALBUMIN 4.4 07/31/2019 0920   AST 19 07/31/2019 0920   ALT 16 07/31/2019 0920   ALKPHOS 65 07/31/2019 0920   BILITOT 0.6 07/31/2019 0920   GFRNONAA 52 (L) 10/05/2018 2357   GFRAA 60 (L) 10/05/2018 2357    Assessment: 1.  Anal fissure: Superficial, seems to be healing but still painful today 2.  Thrombosed external hemorrhoid: Very small and not very tender to palpation, it has shrunk per the patient  Plan: 1.  Prescribed Diltiazem gel to be applied 3 times daily x6-8 weeks. 2.  Discussed RectiCare lidocaine before bowel movements or as needed for pain 3.  Discussed sitz bath's for 15 to 20 minutes 2-3 times a day 4.  Discussed external hemorrhoid, this seems to be shrinking and going away, the sitz baths will help. 5.  Patient to follow in clinic as needed with [place] in the future.  If this is not better after the next month she should call and let us know.  Ellouise Newer, PA-C [facility] 06/21/2021, 1:35 PM  Cc: Dorothyann Peng, NP

## 2021-06-28 ENCOUNTER — Ambulatory Visit: Payer: 59 | Admitting: Physician Assistant

## 2021-06-28 DIAGNOSIS — Z01419 Encounter for gynecological examination (general) (routine) without abnormal findings: Secondary | ICD-10-CM | POA: Diagnosis not present

## 2021-06-28 DIAGNOSIS — Z6835 Body mass index (BMI) 35.0-35.9, adult: Secondary | ICD-10-CM | POA: Diagnosis not present

## 2021-06-28 DIAGNOSIS — Z124 Encounter for screening for malignant neoplasm of cervix: Secondary | ICD-10-CM | POA: Diagnosis not present

## 2021-06-28 DIAGNOSIS — K602 Anal fissure, unspecified: Secondary | ICD-10-CM | POA: Diagnosis not present

## 2021-07-05 DIAGNOSIS — D259 Leiomyoma of uterus, unspecified: Secondary | ICD-10-CM | POA: Diagnosis not present

## 2021-07-05 DIAGNOSIS — N3281 Overactive bladder: Secondary | ICD-10-CM | POA: Diagnosis not present

## 2021-09-06 DIAGNOSIS — H35373 Puckering of macula, bilateral: Secondary | ICD-10-CM | POA: Diagnosis not present

## 2021-09-06 DIAGNOSIS — H40013 Open angle with borderline findings, low risk, bilateral: Secondary | ICD-10-CM | POA: Diagnosis not present

## 2021-09-06 DIAGNOSIS — H2513 Age-related nuclear cataract, bilateral: Secondary | ICD-10-CM | POA: Diagnosis not present

## 2021-09-06 DIAGNOSIS — H532 Diplopia: Secondary | ICD-10-CM | POA: Diagnosis not present

## 2021-12-01 ENCOUNTER — Other Ambulatory Visit: Payer: Self-pay | Admitting: Adult Health

## 2021-12-01 ENCOUNTER — Ambulatory Visit (INDEPENDENT_AMBULATORY_CARE_PROVIDER_SITE_OTHER): Payer: BC Managed Care – PPO | Admitting: Adult Health

## 2021-12-01 ENCOUNTER — Other Ambulatory Visit: Payer: Self-pay

## 2021-12-01 ENCOUNTER — Ambulatory Visit (INDEPENDENT_AMBULATORY_CARE_PROVIDER_SITE_OTHER): Payer: BC Managed Care – PPO

## 2021-12-01 VITALS — BP 138/76 | HR 67 | Temp 98.1°F | Ht 67.0 in | Wt 224.0 lb

## 2021-12-01 DIAGNOSIS — Z0000 Encounter for general adult medical examination without abnormal findings: Secondary | ICD-10-CM

## 2021-12-01 DIAGNOSIS — F419 Anxiety disorder, unspecified: Secondary | ICD-10-CM | POA: Diagnosis not present

## 2021-12-01 DIAGNOSIS — Z0001 Encounter for general adult medical examination with abnormal findings: Secondary | ICD-10-CM | POA: Diagnosis not present

## 2021-12-01 DIAGNOSIS — R109 Unspecified abdominal pain: Secondary | ICD-10-CM | POA: Diagnosis not present

## 2021-12-01 DIAGNOSIS — F32A Depression, unspecified: Secondary | ICD-10-CM | POA: Diagnosis not present

## 2021-12-01 DIAGNOSIS — I10 Essential (primary) hypertension: Secondary | ICD-10-CM

## 2021-12-01 DIAGNOSIS — R1012 Left upper quadrant pain: Secondary | ICD-10-CM

## 2021-12-01 LAB — COMPREHENSIVE METABOLIC PANEL
ALT: 16 U/L (ref 0–35)
AST: 20 U/L (ref 0–37)
Albumin: 4.2 g/dL (ref 3.5–5.2)
Alkaline Phosphatase: 69 U/L (ref 39–117)
BUN: 17 mg/dL (ref 6–23)
CO2: 31 mEq/L (ref 19–32)
Calcium: 10.7 mg/dL — ABNORMAL HIGH (ref 8.4–10.5)
Chloride: 103 mEq/L (ref 96–112)
Creatinine, Ser: 0.92 mg/dL (ref 0.40–1.20)
GFR: 66.71 mL/min (ref >60.00)
Glucose, Bld: 100 mg/dL — ABNORMAL HIGH (ref 70–99)
Potassium: 4 mEq/L (ref 3.5–5.1)
Sodium: 141 mEq/L (ref 135–145)
Total Bilirubin: 0.5 mg/dL (ref 0.2–1.2)
Total Protein: 7.5 g/dL (ref 6.0–8.3)

## 2021-12-01 LAB — CBC WITH DIFFERENTIAL/PLATELET
Basophils Absolute: 0 10*3/uL (ref 0.0–0.1)
Basophils Relative: 0.3 % (ref 0.0–3.0)
Eosinophils Absolute: 0.2 10*3/uL (ref 0.0–0.7)
Eosinophils Relative: 2.8 % (ref 0.0–5.0)
HCT: 41.7 % (ref 36.0–46.0)
Hemoglobin: 13.3 g/dL (ref 12.0–15.0)
Lymphocytes Relative: 29.6 % (ref 12.0–46.0)
Lymphs Abs: 1.8 10*3/uL (ref 0.7–4.0)
MCHC: 31.9 g/dL (ref 30.0–36.0)
MCV: 84.3 fl (ref 78.0–100.0)
Monocytes Absolute: 0.5 10*3/uL (ref 0.1–1.0)
Monocytes Relative: 8.8 % (ref 3.0–12.0)
Neutro Abs: 3.6 10*3/uL (ref 1.4–7.7)
Neutrophils Relative %: 58.5 % (ref 43.0–77.0)
Platelets: 225 10*3/uL (ref 150.0–400.0)
RBC: 4.95 Mil/uL (ref 3.87–5.11)
RDW: 14.6 % (ref 11.5–15.5)
WBC: 6.2 10*3/uL (ref 4.0–10.5)

## 2021-12-01 LAB — LIPID PANEL
Cholesterol: 201 mg/dL — ABNORMAL HIGH (ref 0–200)
HDL: 78 mg/dL (ref >39.00)
LDL Cholesterol: 112 mg/dL — ABNORMAL HIGH (ref 0–99)
NonHDL: 122.92
Total CHOL/HDL Ratio: 3
Triglycerides: 57 mg/dL (ref 0.0–149.0)
VLDL: 11.4 mg/dL (ref 0.0–40.0)

## 2021-12-01 LAB — TSH: TSH: 1.71 u[IU]/mL (ref 0.35–5.50)

## 2021-12-01 LAB — HEMOGLOBIN A1C: Hgb A1c MFr Bld: 6 % (ref 4.6–6.5)

## 2021-12-01 MED ORDER — ALPRAZOLAM 0.25 MG PO TABS: 0.2500 mg | ORAL_TABLET | Freq: Every day | ORAL | 0 refills | Status: DC

## 2021-12-01 MED ORDER — AMLODIPINE BESYLATE 2.5 MG PO TABS: 2.5000 mg | ORAL_TABLET | Freq: Two times a day (BID) | ORAL | 3 refills | Status: DC

## 2021-12-01 MED ORDER — ESCITALOPRAM OXALATE 20 MG PO TABS: ORAL_TABLET | ORAL | 1 refills | Status: DC

## 2021-12-01 MED ORDER — HYDROCHLOROTHIAZIDE 12.5 MG PO TABS: 12.5000 mg | ORAL_TABLET | Freq: Every day | ORAL | 3 refills | Status: DC

## 2021-12-01 MED ORDER — ALBUTEROL SULFATE HFA 108 (90 BASE) MCG/ACT IN AERS: 2.0000 | INHALATION_SPRAY | Freq: Four times a day (QID) | RESPIRATORY_TRACT | 0 refills | Status: DC | PRN

## 2021-12-01 NOTE — Progress Notes (Signed)
Subjective:    Patient ID: Karen Pope, female    DOB: 08/08/1961, 60 y.o.   MRN: [id number]  HPI Patient presents for yearly preventative medicine examination. She is a pleasant 60 year old female who  has a past medical history of Allergy, Arthritis, Cataract, Claustrophobia, essential hypertension, Lump or mass in breast, Obesity, unspecified, Other disorder of menstruation and other abnormal bleeding from female genital tract, Other speech disturbance(784.59), Urinary incontinence, and Urticaria, unspecified.  Hypertension-takes Norvasc 2.5 mg twice daily and hydrochlorothiazide 25 mg as needed for fluid retention.  She denies dizziness, lightheadedness, chest pain, or shortness of breath BP Readings from Last 3 Encounters:  12/01/21 138/76  06/21/21 136/70  09/04/20 110/80   Anxiety and depression-currently managed with Lexapro 20 mg daily. She feels controlled on this medication. Takes Xanax 0.25 mg infrequently.   LUQ pain -reports that over the last week or so she has had intermittent left upper quadrant pain with a few days of diarrhea.  Diarrhea has since resolved.  Has not been constipated.  History of kidney stones.  No fevers or chills  All immunizations and health maintenance protocols were reviewed with the patient and needed orders were placed.  Appropriate screening laboratory values were ordered for the patient including screening of hyperlipidemia, renal function and hepatic function.  Medication reconciliation,  past medical history, social history, problem list and allergies were reviewed in detail with the patient  Goals were established with regard to weight loss, exercise, and  diet in compliance with medications. She has cut back on portion size but not exercising.   Wt Readings from Last 3 Encounters:  12/01/21 224 lb (101.6 kg)  06/21/21 224 lb (101.6 kg)  09/04/20 227 lb 11.2 oz (103.3 kg)   She is up to date on routine colon cancer screening, mammogram  and GYN care.   Review of Systems  Constitutional: Negative.   HENT: Negative.    Eyes: Negative.   Respiratory: Negative.    Cardiovascular: Negative.   Gastrointestinal:  Positive for abdominal pain and diarrhea. Negative for constipation.  Endocrine: Negative.   Genitourinary: Negative.   Musculoskeletal: Negative.   Skin: Negative.   Allergic/Immunologic: Negative.   Neurological: Negative.   Hematological: Negative.   Psychiatric/Behavioral: Negative.     Past Medical History:  Diagnosis Date   Allergy    Arthritis    knees   Cataract    forming both eyes   Claustrophobia    essential hypertension    Lump or mass in breast    Obesity, unspecified    Other disorder of menstruation and other abnormal bleeding from female genital tract    Other speech disturbance(784.59)    Urinary incontinence    Urticaria, unspecified     Social History   Socioeconomic History   Marital status: Married    Spouse name: Not on file   Number of children: Not on file   Years of education: Not on file   Highest education level: Not on file  Occupational History   Not on file  Tobacco Use   Smoking status: Never   Smokeless tobacco: Never  Vaping Use   Vaping Use: Never used  Substance and Sexual Activity   Alcohol use: Yes    Comment: rare- 1 x q 4 months   Drug use: No   Sexual activity: Not on file  Other Topics Concern   Not on file  Social History Narrative   Not on file   Social  Determinants of Health   Financial Resource Strain: Not on file  Food Insecurity: Not on file  Transportation Needs: Not on file  Physical Activity: Not on file  Stress: Not on file  Social Connections: Not on file  Intimate Partner Violence: Not on file    Past Surgical History:  Procedure Laterality Date   BREAST BIOPSY     benign    CHOLECYSTECTOMY N/A 10/07/2018   Procedure: LAPAROSCOPIC CHOLECYSTECTOMY WITH INTRAOPERATIVE CHOLANGIOGRAM;  Surgeon: Armandina Gemma, MD;  Location:  [facility];  Service: General;  Laterality: N/A;   COLONOSCOPY     [place] EXTRACTION     46's    Family History  Problem Relation Age of Onset   Breast cancer Mother 43   Leukemia Maternal Aunt        leukemia- type cancer   Cancer Paternal Aunt        either uterine or cervical cancer   Colon cancer Other        in his 1's   Prostate cancer Maternal Uncle    Pancreatic cancer Other        in his 43's   Prostate cancer Half-Brother    Pancreatic cancer Cousin        62    Colon polyps Neg Hx    Esophageal cancer Neg Hx    Rectal cancer Neg Hx    Stomach cancer Neg Hx     Allergies  Allergen Reactions   Lisinopril Swelling   Thimerosal Itching    Current Outpatient Medications on File Prior to Visit  Medication Sig Dispense Refill   acetaminophen (TYLENOL) 500 MG tablet Take 1,000 mg by mouth every 6 (six) hours as needed for moderate pain.     albuterol (PROVENTIL HFA;VENTOLIN HFA) 108 (90 Base) MCG/ACT inhaler Inhale 2 puffs into the lungs every 6 (six) hours as needed. 8 g 0   ALPRAZolam (XANAX) 0.25 MG tablet Take 1 tablet (0.25 mg total) by mouth at bedtime. (Patient taking differently: Take 0.25 mg by mouth at bedtime. Only when a procedure is needed) 30 tablet 0   AMBULATORY NON FORMULARY MEDICATION Medication Name: Diltiazem 2 % gel - Using your index finger, apply a small amount of medication inside the rectum up to your first knuckle/joint three times daily x 6-8 weeks. 30 g 0   amLODipine (NORVASC) 5 MG tablet TAKE 1/2 TABLET(2.5 MG) BY MOUTH TWICE DAILY 90 tablet 0   Cholecalciferol (VITAMIN D3) 2000 units TABS Take 2,000 Units by mouth every other day.      escitalopram (LEXAPRO) 20 MG tablet TAKE 1 TABLET(20 MG) BY MOUTH DAILY 90 tablet 1   hydrochlorothiazide (HYDRODIURIL) 12.5 MG tablet TAKE 1 TABLET(12.5 MG) BY MOUTH DAILY 90 tablet 3   ibuprofen (ADVIL,MOTRIN) 200 MG tablet Take 200-400 mg by mouth every 6  (six) hours as needed for moderate pain.     Multiple Vitamin (MULTIVITAMIN) tablet Take 1 tablet by mouth daily.     nystatin-triamcinolone (MYCOLOG II) cream APP EXT AA BID FOR 7 TO 14 DAYS     Polyvinyl Alcohol-Povidone (REFRESH OP) Apply to eye.     No current facility-administered medications on file prior to visit.    BP 138/76    Pulse 67    Temp 98.1 F (36.7 C) (Oral)    Ht 5\' 7"  (1.702 m)    Wt 224 lb (101.6 kg)    LMP 10/27/2019 (  Exact Date)    SpO2 98%    BMI 35.08 kg/m        Objective:   Physical Exam Vitals and nursing note reviewed.  Constitutional:      General: She is not in acute distress.    Appearance: Normal appearance. She is well-developed. She is not ill-appearing.  HENT:     Head: Normocephalic and atraumatic.     Right Ear: Tympanic membrane, ear canal and external ear normal. There is no impacted cerumen.     Left Ear: Tympanic membrane, ear canal and external ear normal. There is no impacted cerumen.     Nose: Nose normal. No congestion or rhinorrhea.     Mouth/Throat:     Mouth: Mucous membranes are moist.     Pharynx: Oropharynx is clear. No oropharyngeal exudate or posterior oropharyngeal erythema.  Eyes:     General:        Right eye: No discharge.        Left eye: No discharge.     Extraocular Movements: Extraocular movements intact.     Conjunctiva/sclera: Conjunctivae normal.     Pupils: Pupils are equal, round, and reactive to light.  Neck:     Thyroid: No thyromegaly.     Vascular: No carotid bruit.     Trachea: No tracheal deviation.  Cardiovascular:     Rate and Rhythm: Normal rate and regular rhythm.     Pulses: Normal pulses.     Heart sounds: Normal heart sounds. No murmur heard.   No friction rub. No gallop.  Pulmonary:     Effort: Pulmonary effort is normal. No respiratory distress.     Breath sounds: Normal breath sounds. No stridor. No wheezing, rhonchi or rales.  Chest:     Chest wall: No tenderness.  Abdominal:      General: Abdomen is flat. Bowel sounds are normal. There is no distension.     Palpations: Abdomen is soft. There is no mass.     Tenderness: There is abdominal tenderness in the left upper quadrant. There is no right CVA tenderness, left CVA tenderness, guarding or rebound.     Hernia: No hernia is present.  Musculoskeletal:        General: No swelling, tenderness, deformity or signs of injury. Normal range of motion.     Cervical back: Normal range of motion and neck supple.     Right lower leg: No edema.     Left lower leg: No edema.  Lymphadenopathy:     Cervical: No cervical adenopathy.  Skin:    General: Skin is warm and dry.     Coloration: Skin is not jaundiced or pale.     Findings: No bruising, erythema, lesion or rash.  Neurological:     General: No focal deficit present.     Mental Status: She is alert and oriented to person, place, and time.     Cranial Nerves: No cranial nerve deficit.     Sensory: No sensory deficit.     Motor: No weakness.     Coordination: Coordination normal.     Gait: Gait normal.     Deep Tendon Reflexes: Reflexes normal.  Psychiatric:        Mood and Affect: Mood normal.        Behavior: Behavior normal.        Thought Content: Thought content normal.        Judgment: Judgment normal.      Assessment & Plan:  1. Routine general medical examination at a health care facility - encouraged lifestyle modifications to help with weight loss - Follow up in one year or sooner if needed - CBC with Differential/Platelet; Future - Comprehensive metabolic panel; Future - Lipid panel; Future - TSH; Future - Hemoglobin A1c; Future - Hemoglobin A1c - TSH - Lipid panel - Comprehensive metabolic panel - CBC with Differential/Platelet  2. Anxiety and depression  - escitalopram (LEXAPRO) 20 MG tablet; TAKE 1 TABLET(20 MG) BY MOUTH DAILY  Dispense: 90 tablet; Refill: 1 - ALPRAZolam (XANAX) 0.25 MG tablet; Take 1 tablet (0.25 mg total) by mouth at  bedtime.  Dispense: 30 tablet; Refill: 0  3. Primary hypertension - Continue with current medication therapy  - CBC with Differential/Platelet; Future - Comprehensive metabolic panel; Future - Lipid panel; Future - TSH; Future - Hemoglobin A1c; Future - hydrochlorothiazide (HYDRODIURIL) 12.5 MG tablet; Take 1 tablet (12.5 mg total) by mouth daily.  Dispense: 90 tablet; Refill: 3 - amLODipine (NORVASC) 2.5 MG tablet; Take 1 tablet (2.5 mg total) by mouth in the morning and at bedtime.  Dispense: 180 tablet; Refill: 3 - Hemoglobin A1c - TSH - Lipid panel - Comprehensive metabolic panel - CBC with Differential/Platelet   4. Acute LUQ pain  - DG Abd 1 View; Future  Dorothyann Peng, NP

## 2021-12-01 NOTE — Patient Instructions (Signed)
It was great seeing you today   We will follow up with you regarding your lab work   Please let me know if you need anything   

## 2022-04-13 ENCOUNTER — Other Ambulatory Visit: Payer: Self-pay | Admitting: Adult Health

## 2022-04-13 ENCOUNTER — Ambulatory Visit
Admission: RE | Admit: 2022-04-13 | Discharge: 2022-04-13 | Disposition: A | Discharge status: Home / Self Care | Payer: BC Managed Care – PPO | Source: Ambulatory Visit | Attending: Adult Health | Admitting: Adult Health

## 2022-04-13 DIAGNOSIS — Z1231 Encounter for screening mammogram for malignant neoplasm of breast: Secondary | ICD-10-CM | POA: Diagnosis not present

## 2022-05-21 ENCOUNTER — Other Ambulatory Visit: Payer: Self-pay | Admitting: Adult Health

## 2022-05-21 DIAGNOSIS — I10 Essential (primary) hypertension: Secondary | ICD-10-CM

## 2022-06-08 ENCOUNTER — Encounter: Payer: Self-pay | Admitting: Adult Health

## 2022-06-08 NOTE — Telephone Encounter (Signed)
Spoke to pt and she she advised that there is still no redness. She is only having slight irritation was the tick was removed from. Pt stated that she is okay for now. I advised pt to follow up if she need to and we can get her in to see another provider if needed. Pt verbalized understanding.

## 2022-06-24 ENCOUNTER — Telehealth: Payer: Self-pay | Admitting: Adult Health

## 2022-06-24 NOTE — Telephone Encounter (Signed)
Pt called to ask what is [facility] preferred ENT location to send patients to?  [phone]  Please advise.

## 2022-06-29 NOTE — Telephone Encounter (Signed)
Please advise 

## 2022-06-30 NOTE — Telephone Encounter (Signed)
Called Pt and informed her of NP's preference.  Pt was very grateful for the call back, and sent her thanks to NP.

## 2022-06-30 NOTE — Telephone Encounter (Signed)
Noted  

## 2022-07-04 DIAGNOSIS — R32 Unspecified urinary incontinence: Secondary | ICD-10-CM | POA: Diagnosis not present

## 2022-07-04 DIAGNOSIS — Z124 Encounter for screening for malignant neoplasm of cervix: Secondary | ICD-10-CM | POA: Diagnosis not present

## 2022-07-04 DIAGNOSIS — Z1211 Encounter for screening for malignant neoplasm of colon: Secondary | ICD-10-CM | POA: Diagnosis not present

## 2022-07-04 DIAGNOSIS — Z01419 Encounter for gynecological examination (general) (routine) without abnormal findings: Secondary | ICD-10-CM | POA: Diagnosis not present

## 2022-07-04 DIAGNOSIS — N898 Other specified noninflammatory disorders of vagina: Secondary | ICD-10-CM | POA: Diagnosis not present

## 2022-07-04 DIAGNOSIS — Z1239 Encounter for other screening for malignant neoplasm of breast: Secondary | ICD-10-CM | POA: Diagnosis not present

## 2022-07-05 ENCOUNTER — Other Ambulatory Visit: Payer: Self-pay | Admitting: Obstetrics and Gynecology

## 2022-07-05 DIAGNOSIS — N63 Unspecified lump in unspecified breast: Secondary | ICD-10-CM

## 2022-07-26 ENCOUNTER — Other Ambulatory Visit: Payer: Self-pay | Admitting: Adult Health

## 2022-08-08 ENCOUNTER — Ambulatory Visit
Admission: RE | Admit: 2022-08-08 | Discharge: 2022-08-08 | Disposition: A | Discharge status: Home / Self Care | Payer: BC Managed Care – PPO | Source: Ambulatory Visit | Attending: Obstetrics and Gynecology | Admitting: Obstetrics and Gynecology

## 2022-08-08 DIAGNOSIS — N6489 Other specified disorders of breast: Secondary | ICD-10-CM | POA: Diagnosis not present

## 2022-08-08 DIAGNOSIS — N63 Unspecified lump in unspecified breast: Secondary | ICD-10-CM

## 2022-08-08 DIAGNOSIS — R928 Other abnormal and inconclusive findings on diagnostic imaging of breast: Secondary | ICD-10-CM | POA: Diagnosis not present

## 2022-08-23 ENCOUNTER — Other Ambulatory Visit: Payer: Self-pay | Admitting: Adult Health

## 2022-08-23 DIAGNOSIS — I10 Essential (primary) hypertension: Secondary | ICD-10-CM

## 2022-08-24 ENCOUNTER — Other Ambulatory Visit: Payer: Self-pay | Admitting: Adult Health

## 2022-08-24 DIAGNOSIS — F419 Anxiety disorder, unspecified: Secondary | ICD-10-CM

## 2022-08-24 DIAGNOSIS — F32A Depression, unspecified: Secondary | ICD-10-CM

## 2022-09-05 DIAGNOSIS — D259 Leiomyoma of uterus, unspecified: Secondary | ICD-10-CM | POA: Diagnosis not present

## 2022-09-05 DIAGNOSIS — N950 Postmenopausal bleeding: Secondary | ICD-10-CM | POA: Diagnosis not present

## 2022-09-06 DIAGNOSIS — H903 Sensorineural hearing loss, bilateral: Secondary | ICD-10-CM | POA: Diagnosis not present

## 2022-09-12 DIAGNOSIS — N950 Postmenopausal bleeding: Secondary | ICD-10-CM | POA: Diagnosis not present

## 2022-09-12 DIAGNOSIS — R102 Pelvic and perineal pain: Secondary | ICD-10-CM | POA: Diagnosis not present

## 2022-09-13 ENCOUNTER — Ambulatory Visit (INDEPENDENT_AMBULATORY_CARE_PROVIDER_SITE_OTHER): Payer: BC Managed Care – PPO | Admitting: Family Medicine

## 2022-09-13 ENCOUNTER — Encounter: Payer: Self-pay | Admitting: Family Medicine

## 2022-09-13 VITALS — BP 132/78 | HR 82 | Temp 101.0°F | Wt 221.0 lb

## 2022-09-13 DIAGNOSIS — J029 Acute pharyngitis, unspecified: Secondary | ICD-10-CM

## 2022-09-13 DIAGNOSIS — R52 Pain, unspecified: Secondary | ICD-10-CM

## 2022-09-13 DIAGNOSIS — R0989 Other specified symptoms and signs involving the circulatory and respiratory systems: Secondary | ICD-10-CM | POA: Diagnosis not present

## 2022-09-13 LAB — POCT INFLUENZA A/B
Influenza A, POC: NEGATIVE
Influenza B, POC: NEGATIVE

## 2022-09-13 LAB — POCT RAPID STREP A (OFFICE): Rapid Strep A Screen: NEGATIVE

## 2022-09-13 LAB — POC COVID19 BINAXNOW: SARS Coronavirus 2 Ag: NEGATIVE

## 2022-09-13 MED ORDER — CEFUROXIME AXETIL 500 MG PO TABS: 500.0000 mg | ORAL_TABLET | Freq: Two times a day (BID) | ORAL | 0 refills | Status: AC

## 2022-09-13 NOTE — Progress Notes (Signed)
   Subjective:    Patient ID: GWENDALYN MCGONAGLE, female    DOB: 1961-03-12, 61 y.o.   MRN: [id number]  HPI Here for the onset yesterday of runny nose and body aches. Then last night she had a fever of 100.6 degrees, a headache ,a ST , and a dry cough. She has had some nausea, but no vomiting or diarrhea. Taking Tylenol. She was exposed to her daughter 2 weeks ago, who had a strep infection.    Review of Systems  Constitutional:  Positive for chills and fever.  HENT:  Positive for postnasal drip, rhinorrhea and sore throat. Negative for congestion and ear pain.   Eyes: Negative.   Respiratory:  Positive for cough. Negative for shortness of breath and wheezing.   Gastrointestinal:  Positive for nausea. Negative for abdominal pain, diarrhea and vomiting.       Objective:   Physical Exam Constitutional:      Appearance: Normal appearance. She is not ill-appearing.  HENT:     Right Ear: Tympanic membrane, ear canal and external ear normal.     Left Ear: Tympanic membrane, ear canal and external ear normal.     Nose: Nose normal.     Mouth/Throat:     Pharynx: Oropharyngeal exudate and posterior oropharyngeal erythema present.  Eyes:     Conjunctiva/sclera: Conjunctivae normal.  Cardiovascular:     Pulses: Normal pulses.     Heart sounds: Normal heart sounds.  Lymphadenopathy:     Cervical: No cervical adenopathy.  Neurological:     Mental Status: She is alert.           Assessment & Plan:  Even though her rapid strep test today is negative, I believe she has a strep pharyngitis. Treat with 10 days of Cefuroxime. She is written out of work for today through 09-15-22.  Alysia Penna, MD

## 2022-09-20 DIAGNOSIS — N920 Excessive and frequent menstruation with regular cycle: Secondary | ICD-10-CM | POA: Diagnosis not present

## 2022-11-02 ENCOUNTER — Encounter (INDEPENDENT_AMBULATORY_CARE_PROVIDER_SITE_OTHER): Payer: Self-pay

## 2022-11-02 DIAGNOSIS — D259 Leiomyoma of uterus, unspecified: Secondary | ICD-10-CM | POA: Diagnosis not present

## 2022-11-02 DIAGNOSIS — N939 Abnormal uterine and vaginal bleeding, unspecified: Secondary | ICD-10-CM | POA: Diagnosis not present

## 2022-11-02 DIAGNOSIS — N8003 Adenomyosis of the uterus: Secondary | ICD-10-CM | POA: Diagnosis not present

## 2022-11-21 ENCOUNTER — Other Ambulatory Visit: Payer: Self-pay | Admitting: Adult Health

## 2022-11-21 DIAGNOSIS — F32A Depression, unspecified: Secondary | ICD-10-CM

## 2022-11-22 ENCOUNTER — Other Ambulatory Visit: Payer: Self-pay | Admitting: Adult Health

## 2022-11-22 DIAGNOSIS — I10 Essential (primary) hypertension: Secondary | ICD-10-CM

## 2022-12-02 DIAGNOSIS — Z30430 Encounter for insertion of intrauterine contraceptive device: Secondary | ICD-10-CM | POA: Diagnosis not present

## 2022-12-02 DIAGNOSIS — R9389 Abnormal findings on diagnostic imaging of other specified body structures: Secondary | ICD-10-CM | POA: Diagnosis not present

## 2022-12-02 DIAGNOSIS — N840 Polyp of corpus uteri: Secondary | ICD-10-CM | POA: Diagnosis not present

## 2022-12-02 DIAGNOSIS — N950 Postmenopausal bleeding: Secondary | ICD-10-CM | POA: Diagnosis not present

## 2022-12-02 HISTORY — PX: OTHER SURGICAL HISTORY: SHX169

## 2022-12-07 ENCOUNTER — Encounter: Payer: BC Managed Care – PPO | Admitting: Adult Health

## 2022-12-08 ENCOUNTER — Encounter: Payer: Self-pay | Admitting: Adult Health

## 2022-12-08 ENCOUNTER — Ambulatory Visit (INDEPENDENT_AMBULATORY_CARE_PROVIDER_SITE_OTHER): Payer: BC Managed Care – PPO | Admitting: Adult Health

## 2022-12-08 VITALS — BP 130/78 | HR 82 | Temp 98.2°F | Ht 65.75 in | Wt 219.0 lb

## 2022-12-08 DIAGNOSIS — G4730 Sleep apnea, unspecified: Secondary | ICD-10-CM | POA: Diagnosis not present

## 2022-12-08 DIAGNOSIS — E559 Vitamin D deficiency, unspecified: Secondary | ICD-10-CM

## 2022-12-08 DIAGNOSIS — F32A Depression, unspecified: Secondary | ICD-10-CM | POA: Diagnosis not present

## 2022-12-08 DIAGNOSIS — F419 Anxiety disorder, unspecified: Secondary | ICD-10-CM | POA: Diagnosis not present

## 2022-12-08 DIAGNOSIS — Z0000 Encounter for general adult medical examination without abnormal findings: Secondary | ICD-10-CM

## 2022-12-08 DIAGNOSIS — I10 Essential (primary) hypertension: Secondary | ICD-10-CM | POA: Diagnosis not present

## 2022-12-08 LAB — LIPID PANEL
Cholesterol: 192 mg/dL (ref 0–200)
HDL: 79.6 mg/dL (ref >39.00)
LDL Cholesterol: 103 mg/dL — ABNORMAL HIGH (ref 0–99)
NonHDL: 112.8
Total CHOL/HDL Ratio: 2
Triglycerides: 51 mg/dL (ref 0.0–149.0)
VLDL: 10.2 mg/dL (ref 0.0–40.0)

## 2022-12-08 LAB — CBC WITH DIFFERENTIAL/PLATELET
Basophils Absolute: 0 10*3/uL (ref 0.0–0.1)
Basophils Relative: 0.3 % (ref 0.0–3.0)
Eosinophils Absolute: 0.2 10*3/uL (ref 0.0–0.7)
Eosinophils Relative: 3 % (ref 0.0–5.0)
HCT: 40.8 % (ref 36.0–46.0)
Hemoglobin: 13.2 g/dL (ref 12.0–15.0)
Lymphocytes Relative: 32.5 % (ref 12.0–46.0)
Lymphs Abs: 2.3 10*3/uL (ref 0.7–4.0)
MCHC: 32.5 g/dL (ref 30.0–36.0)
MCV: 84.4 fl (ref 78.0–100.0)
Monocytes Absolute: 0.6 10*3/uL (ref 0.1–1.0)
Monocytes Relative: 8.4 % (ref 3.0–12.0)
Neutro Abs: 3.9 10*3/uL (ref 1.4–7.7)
Neutrophils Relative %: 55.8 % (ref 43.0–77.0)
Platelets: 245 10*3/uL (ref 150.0–400.0)
RBC: 4.84 Mil/uL (ref 3.87–5.11)
RDW: 14.6 % (ref 11.5–15.5)
WBC: 7 10*3/uL (ref 4.0–10.5)

## 2022-12-08 LAB — COMPREHENSIVE METABOLIC PANEL
ALT: 14 U/L (ref 0–35)
AST: 17 U/L (ref 0–37)
Albumin: 4.4 g/dL (ref 3.5–5.2)
Alkaline Phosphatase: 60 U/L (ref 39–117)
BUN: 19 mg/dL (ref 6–23)
CO2: 30 mEq/L (ref 19–32)
Calcium: 10.8 mg/dL — ABNORMAL HIGH (ref 8.4–10.5)
Chloride: 106 mEq/L (ref 96–112)
Creatinine, Ser: 0.97 mg/dL (ref 0.40–1.20)
GFR: 62.16 mL/min (ref >60.00)
Glucose, Bld: 98 mg/dL (ref 70–99)
Potassium: 4 mEq/L (ref 3.5–5.1)
Sodium: 142 mEq/L (ref 135–145)
Total Bilirubin: 0.4 mg/dL (ref 0.2–1.2)
Total Protein: 7.5 g/dL (ref 6.0–8.3)

## 2022-12-08 LAB — HEMOGLOBIN A1C: Hgb A1c MFr Bld: 5.9 % (ref 4.6–6.5)

## 2022-12-08 MED ORDER — ALPRAZOLAM 0.25 MG PO TABS: 0.2500 mg | ORAL_TABLET | Freq: Every day | ORAL | 0 refills | Status: DC

## 2022-12-08 MED ORDER — ESCITALOPRAM OXALATE 20 MG PO TABS: 20.0000 mg | ORAL_TABLET | Freq: Every day | ORAL | 1 refills | Status: DC

## 2022-12-08 MED ORDER — HYDROCHLOROTHIAZIDE 12.5 MG PO TABS: 12.5000 mg | ORAL_TABLET | Freq: Every day | ORAL | 3 refills | Status: DC

## 2022-12-08 NOTE — Patient Instructions (Signed)
It was great seeing you today   We will follow up with you regarding your lab work   Please let me know if you need anything   

## 2022-12-08 NOTE — Progress Notes (Signed)
Subjective:    Patient ID: Karen Pope, female    DOB: December 03, 1961, 61 y.o.   MRN: [id number]  HPI Patient presents for yearly preventative medicine examination. She is a pleasant 61 year old female who  has a past medical history of Allergy, Arthritis, Cataract, Claustrophobia, essential hypertension, Lump or mass in breast, Obesity, unspecified, Other disorder of menstruation and other abnormal bleeding from female genital tract, Other speech disturbance(784.59), Urinary incontinence, and Urticaria, unspecified.  Hypertension-managed with Norvasc 2.5 mg twice daily and hydrochlorothiazide 25 mg as needed for fluid retention.  She denies dizziness, lightheadedness, chest pain, or shortness of breath BP Readings from Last 3 Encounters:  12/08/22 130/78  09/13/22 132/78  12/01/21 138/76   Anxiety and depression-managed with Lexapro 20 mg daily.  She feels controlled on this medication.  Take Xanax 0.25 mg infrequently  Vitamin D Deficiency - takes vitamin D supplement periodically   Concern for sleep apnea - had recent anesthesia done and there was concern for sleep apnea. She does report fatigue throughout the day, snoring, and apneic episodes at home   All immunizations and health maintenance protocols were reviewed with the patient and needed orders were placed.  Appropriate screening laboratory values were ordered for the patient including screening of hyperlipidemia, renal function and hepatic function. Medication reconciliation,  past medical history, social history, problem list and allergies were reviewed in detail with the patient  Goals were established with regard to weight loss, exercise, and  diet in compliance with medications Wt Readings from Last 3 Encounters:  12/08/22 219 lb (99.3 kg)  09/13/22 221 lb (100.2 kg)  12/01/21 224 lb (101.6 kg)   She is up-to-date on routine colon cancer screening, mammograms, and GYN care   Review of Systems  Constitutional:  Negative.   HENT: Negative.    Eyes: Negative.   Respiratory: Negative.    Cardiovascular: Negative.   Gastrointestinal: Negative.   Endocrine: Negative.   Genitourinary: Negative.   Musculoskeletal: Negative.   Skin: Negative.   Allergic/Immunologic: Negative.   Neurological: Negative.   Hematological: Negative.   Psychiatric/Behavioral: Negative.     Past Medical History:  Diagnosis Date   Allergy    Arthritis    knees   Cataract    forming both eyes   Claustrophobia    essential hypertension    Lump or mass in breast    Obesity, unspecified    Other disorder of menstruation and other abnormal bleeding from female genital tract    Other speech disturbance(784.59)    Urinary incontinence    Urticaria, unspecified     Social History   Socioeconomic History   Marital status: Married    Spouse name: Not on file   Number of children: Not on file   Years of education: Not on file   Highest education level: Not on file  Occupational History   Not on file  Tobacco Use   Smoking status: Never   Smokeless tobacco: Never  Vaping Use   Vaping Use: Never used  Substance and Sexual Activity   Alcohol use: Yes    Comment: rare- 1 x q 4 months   Drug use: No   Sexual activity: Not on file  Other Topics Concern   Not on file  Social History Narrative   Not on file   Social Determinants of Health   Financial Resource Strain: Not on file  Food Insecurity: Not on file  Transportation Needs: Not on file  Physical Activity: Not  on file  Stress: Not on file  Social Connections: Not on file  Intimate Partner Violence: Not on file    Past Surgical History:  Procedure Laterality Date   BREAST BIOPSY     benign    CHOLECYSTECTOMY N/A 10/07/2018   Procedure: LAPAROSCOPIC CHOLECYSTECTOMY WITH INTRAOPERATIVE CHOLANGIOGRAM;  Surgeon: Armandina Gemma, MD;  Location: [facility];  Service: General;  Laterality: N/A;   COLONOSCOPY     [place] EXTRACTION     58's    Family History  Problem Relation Age of Onset   Breast cancer Mother 74   Leukemia Maternal Aunt        leukemia- type cancer   Breast cancer Maternal Aunt 87   Prostate cancer Maternal Uncle    Cancer Paternal Aunt        either uterine or cervical cancer   Pancreatic cancer Cousin        25    Prostate cancer Half-Brother    Colon cancer Other        in his 19's   Pancreatic cancer Other        in his 25's   Colon polyps Neg Hx    Esophageal cancer Neg Hx    Rectal cancer Neg Hx    Stomach cancer Neg Hx     Allergies  Allergen Reactions   Lisinopril Swelling   Thimerosal (Thiomersal) Itching    Current Outpatient Medications on File Prior to Visit  Medication Sig Dispense Refill   acetaminophen (TYLENOL) 500 MG tablet Take 1,000 mg by mouth every 6 (six) hours as needed for moderate pain.     albuterol (VENTOLIN HFA) 108 (90 Base) MCG/ACT inhaler INHALE 2 PUFFS BY MOUTH EVERY 6 HOURS AS NEEDED 8.5 each 1   ALPRAZolam (XANAX) 0.25 MG tablet Take 1 tablet (0.25 mg total) by mouth at bedtime. 30 tablet 0   AMBULATORY NON FORMULARY MEDICATION Medication Name: Diltiazem 2 % gel - Using your index finger, apply a small amount of medication inside the rectum up to your first knuckle/joint three times daily x 6-8 weeks. 30 g 0   amLODipine (NORVASC) 5 MG tablet TAKE 1 TABLET (5 MG TOTAL) BY MOUTH DAILY. 90 tablet 0   Cholecalciferol (VITAMIN D3) 2000 units TABS Take 2,000 Units by mouth every other day.      escitalopram (LEXAPRO) 20 MG tablet TAKE 1 TABLET BY MOUTH EVERY DAY 30 tablet 0   hydrochlorothiazide (HYDRODIURIL) 12.5 MG tablet Take 1 tablet (12.5 mg total) by mouth daily. 90 tablet 3   ibuprofen (ADVIL,MOTRIN) 200 MG tablet Take 200-400 mg by mouth every 6 (six) hours as needed for moderate pain.     levonorgestrel (MIRENA, 52 MG,) 20 MCG/DAY IUD      Multiple Vitamin (MULTIVITAMIN) tablet Take 1 tablet by mouth daily.      nystatin-triamcinolone (MYCOLOG II) cream APP EXT AA BID FOR 7 TO 14 DAYS     Polyvinyl Alcohol-Povidone (REFRESH OP) Apply to eye.     oxybutynin (DITROPAN-XL) 5 MG 24 hr tablet Take 1 tablet by mouth daily.     triamcinolone ointment (KENALOG) 0.1 % APPLY THIN LAYER TOPICALLY TO THE AFFECTED AREA TWICE DAILY FOR 7 DAYS AS NEEDED     No current facility-administered medications on file prior to visit.    BP 130/78   Pulse 82   Temp 98.2 F (36.8 C) (Oral)   Ht 5' 5.75" (  1.67 m)   Wt 219 lb (99.3 kg)   LMP 10/27/2019 (Exact Date)   SpO2 96%   BMI 35.62 kg/m       Objective:   Physical Exam Vitals and nursing note reviewed.  Constitutional:      General: She is not in acute distress.    Appearance: Normal appearance. She is well-developed. She is not ill-appearing.  HENT:     Head: Normocephalic and atraumatic.     Right Ear: Tympanic membrane, ear canal and external ear normal. There is no impacted cerumen.     Left Ear: Tympanic membrane, ear canal and external ear normal. There is no impacted cerumen.     Nose: Nose normal. No congestion or rhinorrhea.     Mouth/Throat:     Mouth: Mucous membranes are moist.     Pharynx: Oropharynx is clear. No oropharyngeal exudate or posterior oropharyngeal erythema.  Eyes:     General:        Right eye: No discharge.        Left eye: No discharge.     Extraocular Movements: Extraocular movements intact.     Conjunctiva/sclera: Conjunctivae normal.     Pupils: Pupils are equal, round, and reactive to light.  Neck:     Thyroid: No thyromegaly.     Vascular: No carotid bruit.     Trachea: No tracheal deviation.  Cardiovascular:     Rate and Rhythm: Normal rate and regular rhythm.     Pulses: Normal pulses.     Heart sounds: Normal heart sounds. No murmur heard.    No friction rub. No gallop.  Pulmonary:     Effort: Pulmonary effort is normal. No respiratory distress.     Breath sounds: Normal breath sounds. No stridor. No  wheezing, rhonchi or rales.  Chest:     Chest wall: No tenderness.  Abdominal:     General: Abdomen is flat. Bowel sounds are normal. There is no distension.     Palpations: Abdomen is soft. There is no mass.     Tenderness: There is no abdominal tenderness. There is no right CVA tenderness, left CVA tenderness, guarding or rebound.     Hernia: No hernia is present.  Musculoskeletal:        General: No swelling, tenderness, deformity or signs of injury. Normal range of motion.     Cervical back: Normal range of motion and neck supple.     Right lower leg: No edema.     Left lower leg: No edema.  Lymphadenopathy:     Cervical: No cervical adenopathy.  Skin:    General: Skin is warm and dry.     Coloration: Skin is not jaundiced or pale.     Findings: No bruising, erythema, lesion or rash.  Neurological:     General: No focal deficit present.     Mental Status: She is alert and oriented to person, place, and time.     Cranial Nerves: No cranial nerve deficit.     Sensory: No sensory deficit.     Motor: No weakness.     Coordination: Coordination normal.     Gait: Gait normal.     Deep Tendon Reflexes: Reflexes normal.  Psychiatric:        Mood and Affect: Mood normal.        Behavior: Behavior normal.        Thought Content: Thought content normal.        Judgment: Judgment normal.  Assessment & Plan:  1. Routine general medical examination at a health care facility Today patient counseled on age appropriate routine health concerns for screening and prevention, each reviewed and up to date or declined. Immunizations reviewed and up to date or declined. Labs ordered and reviewed. Risk factors for depression reviewed and negative. Hearing function and visual acuity are intact. ADLs screened and addressed as needed. Functional ability and level of safety reviewed and appropriate. Education, counseling and referrals performed based on assessed risks today. Patient provided with a  copy of personalized plan for preventive services.   2. Anxiety and depression - Controlled. No change  - ALPRAZolam (XANAX) 0.25 MG tablet; Take 1 tablet (0.25 mg total) by mouth at bedtime.  Dispense: 30 tablet; Refill: 0 - escitalopram (LEXAPRO) 20 MG tablet; Take 1 tablet (20 mg total) by mouth daily.  Dispense: 90 tablet; Refill: 1 - CBC with Differential/Platelet; Future - Comprehensive metabolic panel; Future - Hemoglobin A1c; Future - Lipid panel; Future - TSH; Future  3. Primary hypertension - Controlled. No change  - hydrochlorothiazide (HYDRODIURIL) 12.5 MG tablet; Take 1 tablet (12.5 mg total) by mouth daily.  Dispense: 90 tablet; Refill: 3 - CBC with Differential/Platelet; Future - Comprehensive metabolic panel; Future - Hemoglobin A1c; Future - Lipid panel; Future - TSH; Future  4. Sleep apnea, unspecified type  - Ambulatory referral to Pulmonology - CBC with Differential/Platelet; Future - Comprehensive metabolic panel; Future - Hemoglobin A1c; Future - Lipid panel; Future - TSH; Future  5. Vitamin D deficiency  - VITAMIN D 25 Hydroxy (Vit-D Deficiency, Fractures); Future  Dorothyann Peng, NP

## 2022-12-09 LAB — VITAMIN D 25 HYDROXY (VIT D DEFICIENCY, FRACTURES): VITD: 19.93 ng/mL — ABNORMAL LOW (ref 30.00–100.00)

## 2022-12-09 LAB — TSH: TSH: 1.46 u[IU]/mL (ref 0.35–5.50)

## 2022-12-13 ENCOUNTER — Other Ambulatory Visit: Payer: Self-pay

## 2022-12-13 MED ORDER — VITAMIN D (ERGOCALCIFEROL) 1.25 MG (50000 UNIT) PO CAPS: 50000.0000 [IU] | ORAL_CAPSULE | ORAL | 0 refills | Status: DC

## 2022-12-26 ENCOUNTER — Institutional Professional Consult (permissible substitution): Payer: BC Managed Care – PPO | Admitting: Nurse Practitioner

## 2023-01-06 ENCOUNTER — Other Ambulatory Visit: Payer: Self-pay | Admitting: Adult Health

## 2023-01-06 ENCOUNTER — Ambulatory Visit (INDEPENDENT_AMBULATORY_CARE_PROVIDER_SITE_OTHER): Payer: BC Managed Care – PPO | Admitting: Family Medicine

## 2023-01-06 VITALS — BP 128/80 | HR 73 | Temp 98.4°F | Ht 65.75 in | Wt 217.8 lb

## 2023-01-06 DIAGNOSIS — H6502 Acute serous otitis media, left ear: Secondary | ICD-10-CM

## 2023-01-06 DIAGNOSIS — R590 Localized enlarged lymph nodes: Secondary | ICD-10-CM

## 2023-01-06 DIAGNOSIS — R6884 Jaw pain: Secondary | ICD-10-CM

## 2023-01-06 DIAGNOSIS — K0889 Other specified disorders of teeth and supporting structures: Secondary | ICD-10-CM

## 2023-01-06 DIAGNOSIS — J029 Acute pharyngitis, unspecified: Secondary | ICD-10-CM | POA: Diagnosis not present

## 2023-01-06 DIAGNOSIS — I10 Essential (primary) hypertension: Secondary | ICD-10-CM

## 2023-01-06 MED ORDER — FLUTICASONE PROPIONATE 50 MCG/ACT NA SUSP: 1.0000 | Freq: Every day | NASAL | 0 refills | Status: DC

## 2023-01-06 MED ORDER — DOXYCYCLINE HYCLATE 100 MG PO TABS: 100.0000 mg | ORAL_TABLET | Freq: Two times a day (BID) | ORAL | 0 refills | Status: AC

## 2023-01-06 NOTE — Progress Notes (Unsigned)
Established Patient Office Visit   Subjective  Patient ID: Karen Pope, female    DOB: [birth year]  Age: 62 y.o. MRN: [id number]  Chief Complaint  Patient presents with   Jaw Pain    Pt reports of Left jaw pain and hurts to swallow. Sx going on to 4 wks. Experiencing left ear pain on 12/28/2022. Went to [facility] on 1/19. Tested neg for strept. On abx.    Ear Pain    Patient is a 62 year old female followed by Dorothyann Peng, NP and seen for acute concern.  Patient endorses rhinorrhea, frontal pressure, left jaw pain, pain with swallowing which started 12/28/2022.  [facility] and started on Augmentin x 10 days.  Strep testing was negative.  Patient concerned as symptoms have continued.  Patient mentions losing a crown and upper left tooth.  States the pain has not been from that.  Pt postponed dental work 2/2 cost.      ROS Negative unless stated above    Objective:     BP 128/80   Pulse 73   Temp 98.4 F (36.9 C) (Oral)   Ht 5' 5.75" (1.67 m)   Wt 217 lb 12.8 oz (98.8 kg)   LMP 10/27/2019 (Exact Date)   SpO2 96%   BMI 35.42 kg/m    Physical Exam Constitutional:      General: She is not in acute distress.    Appearance: Normal appearance.  HENT:     Head: Normocephalic and atraumatic.     Jaw: There is normal jaw occlusion. No tenderness or pain on movement.     Ears:     Comments: L TM full with effusion.    Nose: Nose normal.     Mouth/Throat:     Mouth: Mucous membranes are moist.  Neck:     Comments: Submandibular lymphadenopathy Cardiovascular:     Rate and Rhythm: Normal rate and regular rhythm.     Heart sounds: Normal heart sounds. No murmur heard.    No gallop.  Pulmonary:     Effort: Pulmonary effort is normal. No respiratory distress.     Breath sounds: Normal breath sounds. No wheezing, rhonchi or rales.  Skin:    General: Skin is warm and dry.  Neurological:     Mental Status: She is alert and oriented to person,  place, and time.      No results found for any visits on 01/06/23.    Assessment & Plan:  Submandibular lymphadenopathy -     Doxycycline Hyclate; Take 1 tablet (100 mg total) by mouth 2 (two) times daily for 7 days.  Dispense: 14 tablet; Refill: 0  Jaw pain -     Doxycycline Hyclate; Take 1 tablet (100 mg total) by mouth 2 (two) times daily for 7 days.  Dispense: 14 tablet; Refill: 0  Acute serous otitis media of left ear, recurrence not specified -     Doxycycline Hyclate; Take 1 tablet (100 mg total) by mouth 2 (two) times daily for 7 days.  Dispense: 14 tablet; Refill: 0 -     Fluticasone Propionate; Place 1 spray into both nostrils daily.  Dispense: 16 g; Refill: 0  Pharyngitis, unspecified etiology -     Doxycycline Hyclate; Take 1 tablet (100 mg total) by mouth 2 (two) times daily for 7 days.  Dispense: 14 tablet; Refill: 0  Loss of retention of dental crown  Patient failed course of.  Will start doxycycline for pharyngitis given  continued symptoms.  Will also start Flonase.  Advised to follow-up with dentist to replace crown.  Continue other supportive care.  Given strict precautions.  Return if symptoms worsen or fail to improve.   Billie Ruddy, MD

## 2023-01-11 ENCOUNTER — Telehealth: Payer: Self-pay | Admitting: Adult Health

## 2023-01-11 NOTE — Telephone Encounter (Signed)
Patient saw Volanda Napoleon 01/06/23 was treated with antibiotics, was told to f/u if lymph node didn't go down. They are not going down and she still has throat and ear pain. Patient has an appointment with ENT tomorrow, asking if she should keep that appointment and let them f/u, go see her dentist or come in here for f/u.

## 2023-01-11 NOTE — Telephone Encounter (Signed)
Spoke to patient. Pt had said she made ENT appt before she schedule an appt with [place]. Brought attention to Dr. Volanda Napoleon. Dr. Volanda Napoleon okay for pt to keep her appt with ENT.  Pt is aware. Pt reports she will update [place] on the visit.

## 2023-01-12 DIAGNOSIS — R070 Pain in throat: Secondary | ICD-10-CM | POA: Diagnosis not present

## 2023-01-12 DIAGNOSIS — H9202 Otalgia, left ear: Secondary | ICD-10-CM | POA: Diagnosis not present

## 2023-01-30 ENCOUNTER — Ambulatory Visit (INDEPENDENT_AMBULATORY_CARE_PROVIDER_SITE_OTHER): Payer: BC Managed Care – PPO | Admitting: Nurse Practitioner

## 2023-01-30 ENCOUNTER — Encounter: Payer: Self-pay | Admitting: Nurse Practitioner

## 2023-01-30 VITALS — BP 116/70 | HR 76 | Ht 67.0 in | Wt 219.2 lb

## 2023-01-30 DIAGNOSIS — M26609 Unspecified temporomandibular joint disorder, unspecified side: Secondary | ICD-10-CM | POA: Diagnosis not present

## 2023-01-30 DIAGNOSIS — R0683 Snoring: Secondary | ICD-10-CM

## 2023-01-30 DIAGNOSIS — G4719 Other hypersomnia: Secondary | ICD-10-CM

## 2023-01-30 NOTE — Progress Notes (Signed)
$@PatientE$  ID: Karen Pope, female    DOB: 28-May-1961, 62 y.o.   MRN: [id number]  Chief Complaint  Patient presents with   Consult    Pt consult she stated that she had a procedure and anesthesiologist suggested she have a sleep study. Pt reports that she has been told she snore and has possible witnessed apneas from spouse    Referring provider: Dorothyann Peng, NP  HPI: 62 year old female, never smoker referred for sleep consult. Past medical history significant for HTN, obesity.   TEST/EVENTS:   01/30/2023: Today - sleep consult Patient presents today for sleep consult referred by Dorothyann Peng, NP. She had a gynecologic procedure recently and afterwards, she was advised by the anesthesiologist to seek further evaluation as they were concerned she may have sleep apnea. She had very loud snoring and changes in her breathing pattern. She has also been told by her husband that she snores loudly and has shallow breathing at night. She has slept talked in the past. She feels tired during the day and wakes feeling poorly rested. She wakes with morning headaches and a dry mouth in the morning. She has never fallen asleep while driving but she used to do home visits and when she would park somewhere to chart, she would doze off easily. She also tells me that she's having some trouble with jaw discomfort on the left side. Her ear was bothering her as well. They treated her with augmentin and doxycycline with some improvement. She still notices that her jaw is tight and painful at times. When she opens her mouth, she occasionally notices a popping sensation. She is going to be seeing her dentist soon. She denies sleep walking, sleep paralysis. No history of narcolepsy or symptoms of cataplexy.  She goes to sleep between midnight and 1 am. Falls asleep quickly, within 10 min. Doesn't tend to wake through the night. Wakes around 26-8 am. 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 She is primarily working from home now. Her weight is down 10 lb over the past two years. She has never had a sleep study before.  She has a history of HTN, HLD, and allergies. No history of stroke. She is a never smoker. Rarely drinks alcohol. No sleep aids. 1-2 caffeinated  beverages a day. Lives with her husband. She has 3 children. Family history of heart disease and breast cancer  Epworth 15  Allergies  Allergen Reactions   Lisinopril Swelling   Thimerosal (Thiomersal) Itching    Immunization History  Administered Date(s) Administered   Hep B, Unspecified 06/29/1977, 06/11/1982   Influenza Split 09/11/2013   Influenza Whole 12/13/2007   Influenza,inj,Quad PF,6+ Mos 08/29/2017, 08/30/2018, 10/11/2021, 10/11/2022   PPD Test 07/23/2014   Td 12/12/1998, 07/06/2009   Tdap 07/31/2019   Unspecified SARS-COV-2 Vaccination 12/30/2019, 01/17/2020    Past Medical History:  Diagnosis Date   Allergy    Arthritis    knees   Cataract    forming both eyes   Claustrophobia    essential hypertension    Lump or mass in breast    Obesity, unspecified    Other disorder of menstruation and other abnormal bleeding from female genital tract    Other speech disturbance(784.59)    Urinary incontinence    Urticaria, unspecified     Tobacco History: Social History   Tobacco Use  Smoking Status Never  Smokeless Tobacco Never   Counseling  given: Not Answered   Outpatient Medications Prior to Visit  Medication Sig Dispense Refill   acetaminophen (TYLENOL) 500 MG tablet Take 1,000 mg by mouth every 6 (six) hours as needed for moderate pain.     albuterol (VENTOLIN HFA) 108 (90 Base) MCG/ACT inhaler INHALE 2 PUFFS BY MOUTH EVERY 6 HOURS AS NEEDED 8.5 each 1   ALPRAZolam (XANAX) 0.25 MG tablet Take 1 tablet (0.25 mg total) by mouth at bedtime. 30 tablet 0   AMBULATORY NON FORMULARY MEDICATION Medication Name: Diltiazem 2 % gel - Using your index finger, apply a  small amount of medication inside the rectum up to your first knuckle/joint three times daily x 6-8 weeks. 30 g 0   amLODipine (NORVASC) 5 MG tablet TAKE 1 TABLET (5 MG TOTAL) BY MOUTH DAILY. 90 tablet 0   Cholecalciferol (VITAMIN D3) 2000 units TABS Take 2,000 Units by mouth every other day.      escitalopram (LEXAPRO) 20 MG tablet Take 1 tablet (20 mg total) by mouth daily. 90 tablet 1   fluticasone (FLONASE) 50 MCG/ACT nasal spray Place 1 spray into both nostrils daily. 16 g 0   hydrochlorothiazide (HYDRODIURIL) 12.5 MG tablet Take 1 tablet (12.5 mg total) by mouth daily. 90 tablet 3   ibuprofen (ADVIL,MOTRIN) 200 MG tablet Take 200-400 mg by mouth every 6 (six) hours as needed for moderate pain.     levonorgestrel (MIRENA, 52 MG,) 20 MCG/DAY IUD      Multiple Vitamin (MULTIVITAMIN) tablet Take 1 tablet by mouth daily.     nystatin-triamcinolone (MYCOLOG II) cream APP EXT AA BID FOR 7 TO 14 DAYS     oxybutynin (DITROPAN-XL) 5 MG 24 hr tablet Take 1 tablet by mouth daily.     Polyvinyl Alcohol-Povidone (REFRESH OP) Apply to eye.     triamcinolone ointment (KENALOG) 0.1 % APPLY THIN LAYER TOPICALLY TO THE AFFECTED AREA TWICE DAILY FOR 7 DAYS AS NEEDED     Vitamin D, Ergocalciferol, (DRISDOL) 1.25 MG (50000 UNIT) CAPS capsule Take 1 capsule (50,000 Units total) by mouth every 7 (seven) days. 25 capsule 0   No facility-administered medications prior to visit.     Review of Systems:   Constitutional: No weight loss or gain, night sweats, fevers, chills, or lassitude. +excessive daytime fatigue  HEENT: No difficulty swallowing, tooth/dental problems. No sneezing, itching, nasal congestion, or post nasal drip. +AM dry mouth, headaches, jaw pain/left sided sore throat, left ear ache (improved) CV:  No chest pain, orthopnea, PND, swelling in lower extremities, anasarca, dizziness, palpitations, syncope Resp: +snoring, witnessed apneas, shortness of breath with exertion (baseline), chronic dry  cough. No excess mucus or change in color of mucus. No hemoptysis. No wheezing.  No chest wall deformity GI:  No heartburn, indigestion, abdominal pain, nausea, vomiting, diarrhea, change in bowel habits, loss of appetite GU: No dysuria, change in color of urine, urgency or frequency.   Skin: No rash, lesions, ulcerations MSK:  No joint pain or swelling.   Neuro: No dizziness or lightheadedness.  Psych: No depression or anxiety. Mood stable.     Physical Exam:  BP 116/70   Pulse 76   Ht 5' 7"$  (1.702 m)   Wt 219 lb 3.2 oz (99.4 kg)   LMP 10/27/2019 (Exact Date)   SpO2 97%   BMI 34.33 kg/m   GEN: Pleasant, interactive, well-appearing; obese; in no acute distress. HEENT:  Normocephalic and atraumatic. PERRLA. Sclera white. Nasal turbinates pink, moist and patent bilaterally. No rhinorrhea present. Oropharynx  pink and moist, without exudate or edema. No lesions, ulcerations, or postnasal drip.  NECK:  Supple w/ fair ROM. No JVD present. Normal carotid impulses w/o bruits. Thyroid symmetrical with no goiter or nodules palpated. No lymphadenopathy.  Mallampati II CV: RRR, no m/r/g, no peripheral edema. Pulses intact, +2 bilaterally. No cyanosis, pallor or clubbing. PULMONARY:  Unlabored, regular breathing. Clear bilaterally A&P w/o wheezes/rales/rhonchi. No accessory muscle use.  GI: BS present and normoactive. Soft, non-tender to palpation. No organomegaly or masses detected.  MSK: No erythema, warmth or tenderness. Cap refil <2 sec all extrem. No deformities or joint swelling noted.  Neuro: A/Ox3. No focal deficits noted.   Skin: Warm, no lesions or rashe Psych: Normal affect and behavior. Judgement and thought content appropriate.     Lab Results:  CBC    Component Value Date/Time   WBC 7.0 12/08/2022 0812   RBC 4.84 12/08/2022 0812   HGB 13.2 12/08/2022 0812   HCT 40.8 12/08/2022 0812   PLT 245.0 12/08/2022 0812   MCV 84.4 12/08/2022 0812   MCH 26.6 10/05/2018 2357   MCHC  32.5 12/08/2022 0812   RDW 14.6 12/08/2022 0812   LYMPHSABS 2.3 12/08/2022 0812   MONOABS 0.6 12/08/2022 0812   EOSABS 0.2 12/08/2022 0812   BASOSABS 0.0 12/08/2022 0812    BMET    Component Value Date/Time   NA 142 12/08/2022 0812   K 4.0 12/08/2022 0812   CL 106 12/08/2022 0812   CO2 30 12/08/2022 0812   GLUCOSE 98 12/08/2022 0812   BUN 19 12/08/2022 0812   CREATININE 0.97 12/08/2022 0812   CALCIUM 10.8 (H) 12/08/2022 0812   GFRNONAA 52 (L) 10/05/2018 2357   GFRAA 60 (L) 10/05/2018 2357    BNP No results found for: "BNP"   Imaging:  No results found.        No data to display          No results found for: "NITRICOXIDE"      Assessment & Plan:   Excessive daytime sleepiness She has snoring, excessive daytime sleepiness, nocturnal apneic events, morning headaches, AM dry mouth. BMI 34. Given this,  I am concerned she could have sleep disordered breathing with obstructive sleep apnea. She will need sleep study for further evaluation.    - discussed how weight can impact sleep and risk for sleep disordered breathing - discussed options to assist with weight loss: combination of diet modification, cardiovascular and strength training exercises   - had an extensive discussion regarding the adverse health consequences related to untreated sleep disordered breathing - specifically discussed the risks for hypertension, coronary artery disease, cardiac dysrhythmias, cerebrovascular disease, and diabetes - lifestyle modification discussed   - discussed how sleep disruption can increase risk of accidents, particularly when driving - safe driving practices were discussed  Patient Instructions  Given your symptoms, I am concerned that you may have sleep disordered breathing with sleep apnea. You will need a sleep study for further evaluation. Someone will contact you to schedule this.  We discussed how untreated sleep apnea puts an individual at risk for cardiac  arrhthymias, pulm HTN, DM, stroke and increases their risk for daytime accidents. 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 She will continue working with her PCP and dentist on this. We did discuss correlation between TMD and OSA. See above plan.   I spent 35 minutes of dedicated to the care of this patient on the date of this encounter to include pre-visit review of records, face-to-face time with the patient discussing conditions above, post visit ordering of testing, clinical documentation with the electronic health record, making appropriate referrals as documented, and communicating necessary findings to members of the patients care team.  Clayton Bibles, NP 01/30/2023  Pt aware and understands NP's role.

## 2023-01-30 NOTE — Assessment & Plan Note (Signed)
Symptoms are consistent with possible TMD. She will continue working with her PCP and dentist on this. We did discuss correlation between TMD and OSA. See above plan.

## 2023-01-30 NOTE — Addendum Note (Signed)
Addended by: Clayton Bibles on: 01/30/2023 02:35 PM   Modules accepted: Orders

## 2023-01-30 NOTE — Assessment & Plan Note (Signed)
She has snoring, excessive daytime sleepiness, nocturnal apneic events, morning headaches, AM dry mouth. BMI 34. Given this,  I am concerned she could have sleep disordered breathing with obstructive sleep apnea. She will need sleep study for further evaluation.    - discussed how weight can impact sleep and risk for sleep disordered breathing - discussed options to assist with weight loss: combination of diet modification, cardiovascular and strength training exercises   - had an extensive discussion regarding the adverse health consequences related to untreated sleep disordered breathing - specifically discussed the risks for hypertension, coronary artery disease, cardiac dysrhythmias, cerebrovascular disease, and diabetes - lifestyle modification discussed   - discussed how sleep disruption can increase risk of accidents, particularly when driving - safe driving practices were discussed  Patient Instructions  Given your symptoms, I am concerned that you may have sleep disordered breathing with sleep apnea. You will need a sleep study for further evaluation. Someone will contact you to schedule this.  We discussed how untreated sleep apnea puts an individual at risk for cardiac arrhthymias, pulm HTN, DM, stroke and increases their risk for daytime accidents. We also briefly reviewed treatment options including weight loss, side sleeping position, oral appliance, CPAP therapy or referral to ENT for possible surgical options  Use caution when driving and pull over if you become sleepy  Follow up in 8 weeks with Katie Mcarthur Ivins,NP to review sleep study results, or sooner, if needed

## 2023-01-30 NOTE — Patient Instructions (Addendum)
Given your symptoms, I am concerned that you may have sleep disordered breathing with sleep apnea. You will need a sleep study for further evaluation. Someone will contact you to schedule this.  We discussed how untreated sleep apnea puts an individual at risk for cardiac arrhthymias, pulm HTN, DM, stroke and increases their risk for daytime accidents. We also briefly reviewed treatment options including weight loss, side sleeping position, oral appliance, CPAP therapy or referral to ENT for possible surgical options  Use caution when driving and pull over if you become sleepy  Follow up in 8 weeks with Katie Shakema Surita,NP to review sleep study results, or sooner, if needed

## 2023-02-01 ENCOUNTER — Encounter: Payer: Self-pay | Admitting: Family Medicine

## 2023-03-15 ENCOUNTER — Ambulatory Visit (INDEPENDENT_AMBULATORY_CARE_PROVIDER_SITE_OTHER): Payer: BC Managed Care – PPO | Admitting: Adult Health

## 2023-03-15 DIAGNOSIS — G4719 Other hypersomnia: Secondary | ICD-10-CM

## 2023-03-15 DIAGNOSIS — R0683 Snoring: Secondary | ICD-10-CM

## 2023-03-15 DIAGNOSIS — G4733 Obstructive sleep apnea (adult) (pediatric): Secondary | ICD-10-CM

## 2023-03-24 ENCOUNTER — Telehealth: Payer: Self-pay | Admitting: Nurse Practitioner

## 2023-03-24 NOTE — Telephone Encounter (Signed)
Went over sleep study results with patient. Sooner f/u with patient has been scheduled. NFN

## 2023-03-24 NOTE — Progress Notes (Signed)
Called the pt and there was no answer- LMTCB    

## 2023-03-24 NOTE — Progress Notes (Signed)
03/08/2023: HST with moderate OSA and AHI 20.2/h. Needs f/u to discuss treatment options.

## 2023-03-24 NOTE — Telephone Encounter (Signed)
Patient returning  phone call for results. Patient phone number is [phone].

## 2023-04-10 ENCOUNTER — Encounter: Payer: Self-pay | Admitting: Nurse Practitioner

## 2023-04-10 ENCOUNTER — Ambulatory Visit: Payer: BC Managed Care – PPO | Admitting: Nurse Practitioner

## 2023-04-10 VITALS — BP 126/76 | HR 59 | Temp 98.2°F | Ht 67.0 in | Wt 220.0 lb

## 2023-04-10 DIAGNOSIS — E669 Obesity, unspecified: Secondary | ICD-10-CM | POA: Diagnosis not present

## 2023-04-10 DIAGNOSIS — G4733 Obstructive sleep apnea (adult) (pediatric): Secondary | ICD-10-CM

## 2023-04-10 DIAGNOSIS — R0683 Snoring: Secondary | ICD-10-CM | POA: Diagnosis not present

## 2023-04-10 NOTE — Progress Notes (Signed)
@Patient  ID: Karen Pope, female    DOB: [birth year], 62 y.o.   MRN: [id number]  Chief Complaint  Patient presents with   Follow-up    Hst results.     Referring provider: Shirline Frees, NP  HPI: 62 year old female, never smoker followed for moderate OSA. Past medical history significant for HTN, obesity.   TEST/EVENTS:  03/08/2023 HST: AHI 20.2/h, SpO2 low 79%  01/30/2023: OV with [name] for sleep consult referred by Shirline Frees, NP. She had a gynecologic procedure recently and afterwards, she was advised by the anesthesiologist to seek further evaluation as they were concerned she may have sleep apnea. She had very loud snoring and changes in her breathing pattern. She has also been told by her husband that she snores loudly and has shallow breathing at night. She has slept talked in the past. She feels tired during the day and wakes feeling poorly rested. She wakes with morning headaches and a dry mouth in the morning. She has never fallen asleep while driving but she used to do home visits and when she would park somewhere to chart, she would doze off easily. She also tells me that she's having some trouble with jaw discomfort on the left side. Her ear was bothering her as well. They treated her with augmentin and doxycycline with some improvement. She still notices that her jaw is tight and painful at times. When she opens her mouth, she occasionally notices a popping sensation. She is going to be seeing her dentist soon. She denies sleep walking, sleep paralysis. No history of narcolepsy or symptoms of cataplexy.  She goes to sleep between midnight and 1 am. Falls asleep quickly, within 10 min. Doesn't tend to wake through the night. Wakes around 730-8 am. 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 She is primarily working from home now. Her weight is down 10 lb over the past two years. She has never had a sleep study before.  She has a  history of HTN, HLD, and allergies. No history of stroke. She is a never smoker. Rarely drinks alcohol. No sleep aids. 1-2 caffeinated  beverages a day. Lives with her husband. She has 3 children. Family history of heart disease and breast cancer [place] 15  04/10/2023: Today - follow up Patient presents today for follow up to discuss sleep study results, which revealed moderate OSA. She continues to have trouble with restless sleep and feeling tired during the day. She's more tired now than she was before she started working from home. She is hesitant about CPAP therapy because she has some claustrophobia. Willing to try it.   Allergies  Allergen Reactions   Lisinopril Swelling   Thimerosal (Thiomersal) Itching    Immunization History  Administered Date(s) Administered   Hep B, Unspecified 06/29/1977, 06/11/1982   Influenza Split 09/11/2013   Influenza Whole 12/13/2007   Influenza,inj,Quad PF,6+ Mos 08/29/2017, 08/30/2018, 10/11/2021, 10/11/2022   PPD Test 07/23/2014   Td 12/12/1998, 07/06/2009   Tdap 07/31/2019   Unspecified SARS-COV-2 Vaccination 12/30/2019, 01/17/2020    Past Medical History:  Diagnosis Date   Allergy    Arthritis    knees   Cataract    forming both eyes   Claustrophobia    essential hypertension    Lump or mass in breast    Obesity, unspecified    Other disorder of menstruation and other abnormal bleeding from female genital tract  Other speech disturbance(784.59)    Urinary incontinence    Urticaria, unspecified     Tobacco History: Social History   Tobacco Use  Smoking Status Never  Smokeless Tobacco Never   Counseling given: Not Answered   Outpatient Medications Prior to Visit  Medication Sig Dispense Refill   acetaminophen (TYLENOL) 500 MG tablet Take 1,000 mg by mouth every 6 (six) hours as needed for moderate pain.     albuterol (VENTOLIN HFA) 108 (90 Base) MCG/ACT inhaler INHALE 2 PUFFS BY MOUTH EVERY 6 HOURS AS NEEDED 8.5 each 1    ALPRAZolam (XANAX) 0.25 MG tablet Take 1 tablet (0.25 mg total) by mouth at bedtime. 30 tablet 0   AMBULATORY NON FORMULARY MEDICATION Medication Name: Diltiazem 2 % gel - Using your index finger, apply a small amount of medication inside the rectum up to your first knuckle/joint three times daily x 6-8 weeks. 30 g 0   amLODipine (NORVASC) 5 MG tablet TAKE 1 TABLET (5 MG TOTAL) BY MOUTH DAILY. 90 tablet 0   Cholecalciferol (VITAMIN D3) 2000 units TABS Take 2,000 Units by mouth every other day.      escitalopram (LEXAPRO) 20 MG tablet Take 1 tablet (20 mg total) by mouth daily. 90 tablet 1   fluticasone (FLONASE) 50 MCG/ACT nasal spray Place 1 spray into both nostrils daily. 16 g 0   hydrochlorothiazide (HYDRODIURIL) 12.5 MG tablet Take 1 tablet (12.5 mg total) by mouth daily. 90 tablet 3   ibuprofen (ADVIL,MOTRIN) 200 MG tablet Take 200-400 mg by mouth every 6 (six) hours as needed for moderate pain.     levonorgestrel (MIRENA, 52 MG,) 20 MCG/DAY IUD      Multiple Vitamin (MULTIVITAMIN) tablet Take 1 tablet by mouth daily.     nystatin-triamcinolone (MYCOLOG II) cream APP EXT AA BID FOR 7 TO 14 DAYS     oxybutynin (DITROPAN-XL) 5 MG 24 hr tablet Take 1 tablet by mouth daily.     Polyvinyl Alcohol-Povidone (REFRESH OP) Apply to eye.     triamcinolone ointment (KENALOG) 0.1 % APPLY THIN LAYER TOPICALLY TO THE AFFECTED AREA TWICE DAILY FOR 7 DAYS AS NEEDED     Vitamin D, Ergocalciferol, (DRISDOL) 1.25 MG (50000 UNIT) CAPS capsule Take 1 capsule (50,000 Units total) by mouth every 7 (seven) days. 25 capsule 0   No facility-administered medications prior to visit.     Review of Systems:   Constitutional: No weight loss or gain, night sweats, fevers, chills, or lassitude. +excessive daytime fatigue  HEENT: No difficulty swallowing, tooth/dental problems. No sneezing, itching, nasal congestion, or post nasal drip. +AM dry mouth, headaches, jaw pain/left sided sore throat, left ear ache  (improved) CV:  No chest pain, orthopnea, PND, swelling in lower extremities, anasarca, dizziness, palpitations, syncope Resp: +snoring, witnessed apneas, shortness of breath with exertion (baseline), chronic dry cough. No excess mucus or change in color of mucus. No hemoptysis. No wheezing.  No chest wall deformity GI:  No heartburn, indigestion, abdominal pain, nausea, vomiting, diarrhea, change in bowel habits, loss of appetite GU: No dysuria, change in color of urine, urgency or frequency.   Skin: No rash, lesions, ulcerations MSK:  No joint pain or swelling.   Neuro: No dizziness or lightheadedness.  Psych: No depression or anxiety. Mood stable.     Physical Exam:  BP 126/76   Pulse (!) 59   Temp 98.2 F (36.8 C) (Oral)   Ht 5\' 7"  (1.702 m)   Wt 220 lb (99.8 kg)  LMP 10/27/2019 (Exact Date)   SpO2 99%   BMI 34.46 kg/m   GEN: Pleasant, interactive, well-appearing; obese; in no acute distress. HEENT:  Normocephalic and atraumatic. PERRLA. Sclera white. Nasal turbinates pink, moist and patent bilaterally. No rhinorrhea present. Oropharynx pink and moist, without exudate or edema. No lesions, ulcerations, or postnasal drip.  NECK:  Supple w/ fair ROM. No JVD present. Normal carotid impulses w/o bruits. Thyroid symmetrical with no goiter or nodules palpated. No lymphadenopathy.  Mallampati II CV: RRR, no m/r/g, no peripheral edema. Pulses intact, +2 bilaterally. No cyanosis, pallor or clubbing. PULMONARY:  Unlabored, regular breathing. Clear bilaterally A&P w/o wheezes/rales/rhonchi. No accessory muscle use.  GI: BS present and normoactive. Soft, non-tender to palpation. No organomegaly or masses detected.  MSK: No erythema, warmth or tenderness. Cap refil <2 sec all extrem. No deformities or joint swelling noted.  Neuro: A/Ox3. No focal deficits noted.   Skin: Warm, no lesions or rashe Psych: Normal affect and behavior. Judgement and thought content appropriate.     Lab  Results:  CBC    Component Value Date/Time   WBC 7.0 12/08/2022 0812   RBC 4.84 12/08/2022 0812   HGB 13.2 12/08/2022 0812   HCT 40.8 12/08/2022 0812   PLT 245.0 12/08/2022 0812   MCV 84.4 12/08/2022 0812   MCH 26.6 10/05/2018 2357   MCHC 32.5 12/08/2022 0812   RDW 14.6 12/08/2022 0812   LYMPHSABS 2.3 12/08/2022 0812   MONOABS 0.6 12/08/2022 0812   EOSABS 0.2 12/08/2022 0812   BASOSABS 0.0 12/08/2022 0812    BMET    Component Value Date/Time   NA 142 12/08/2022 0812   K 4.0 12/08/2022 0812   CL 106 12/08/2022 0812   CO2 30 12/08/2022 0812   GLUCOSE 98 12/08/2022 0812   BUN 19 12/08/2022 0812   CREATININE 0.97 12/08/2022 0812   CALCIUM 10.8 (H) 12/08/2022 0812   GFRNONAA 52 (L) 10/05/2018 2357   GFRAA 60 (L) 10/05/2018 2357    BNP No results found for: "BNP"   Imaging:  No results found.        No data to display          No results found for: "NITRICOXIDE"      Assessment & Plan:   Moderate obstructive sleep apnea Moderate OSA with AHI 20.2/h. Reviewed risks of untreated moderate OSA and potential treatment options. She is willing to move forward with CPAP therapy. Orders placed for new start auto CPAP 5-15 cmH2O, nasal mask of choice and heated humidification. Advised on proper care/management of the device. Risks/benefits reviewed. Cautioned on safe driving practices.  Patient Instructions  Start 5-15 cmH2O CPAP every night, minimum of 4-6 hours a night.  Change equipment every 30 days or as directed by DME. Wash your tubing with warm soap and water daily, hang to dry. Wash humidifier portion weekly.  Be aware of reduced alertness and do not drive or operate heavy machinery if experiencing this or drowsiness.  Exercise encouraged, as tolerated. Notify if persistent daytime sleepiness occurs even with consistent use of CPAP.  We discussed how untreated sleep apnea puts an individual at risk for cardiac arrhthymias, pulm HTN, DM, stroke and  increases their risk for daytime accidents. We also briefly reviewed treatment options including weight loss, side sleeping position, oral appliance, CPAP therapy or referral to ENT for possible surgical options  Follow up in 12 weeks with Dr. Wynona Neat or Philis Nettle, or sooner, if needed    Obesity (BMI 30.0-34.9)  BMI 34. Healthy weight loss encouraged.    I spent 35 minutes of dedicated to the care of this patient on the date of this encounter to include pre-visit review of records, face-to-face time with the patient discussing conditions above, post visit ordering of testing, clinical documentation with the electronic health record, making appropriate referrals as documented, and communicating necessary findings to members of the patients care team.  Noemi Chapel, NP 04/10/2023  Pt aware and understands NP's role.

## 2023-04-10 NOTE — Assessment & Plan Note (Signed)
BMI 34. Healthy weight loss encouraged.  

## 2023-04-10 NOTE — Patient Instructions (Signed)
Start 5-15 cmH2O CPAP every night, minimum of 4-6 hours a night.  Change equipment every 30 days or as directed by DME. Wash your tubing with warm soap and water daily, hang to dry. Wash humidifier portion weekly.  Be aware of reduced alertness and do not drive or operate heavy machinery if experiencing this or drowsiness.  Exercise encouraged, as tolerated. Notify if persistent daytime sleepiness occurs even with consistent use of CPAP.  We discussed how untreated sleep apnea puts an individual at risk for cardiac arrhthymias, pulm HTN, DM, stroke and increases their risk for daytime accidents. We also briefly reviewed treatment options including weight loss, side sleeping position, oral appliance, CPAP therapy or referral to ENT for possible surgical options  Follow up in 12 weeks with Dr. Wynona Neat or Philis Nettle, or sooner, if needed

## 2023-04-10 NOTE — Assessment & Plan Note (Signed)
Moderate OSA with AHI 20.2/h. Reviewed risks of untreated moderate OSA and potential treatment options. She is willing to move forward with CPAP therapy. Orders placed for new start auto CPAP 5-15 cmH2O, nasal mask of choice and heated humidification. Advised on proper care/management of the device. Risks/benefits reviewed. Cautioned on safe driving practices.  Patient Instructions  Start 5-15 cmH2O CPAP every night, minimum of 4-6 hours a night.  Change equipment every 30 days or as directed by DME. Wash your tubing with warm soap and water daily, hang to dry. Wash humidifier portion weekly.  Be aware of reduced alertness and do not drive or operate heavy machinery if experiencing this or drowsiness.  Exercise encouraged, as tolerated. Notify if persistent daytime sleepiness occurs even with consistent use of CPAP.  We discussed how untreated sleep apnea puts an individual at risk for cardiac arrhthymias, pulm HTN, DM, stroke and increases their risk for daytime accidents. We also briefly reviewed treatment options including weight loss, side sleeping position, oral appliance, CPAP therapy or referral to ENT for possible surgical options  Follow up in 12 weeks with Dr. Wynona Neat or Philis Nettle, or sooner, if needed

## 2023-04-20 ENCOUNTER — Ambulatory Visit (INDEPENDENT_AMBULATORY_CARE_PROVIDER_SITE_OTHER): Payer: BC Managed Care – PPO | Admitting: Adult Health

## 2023-04-20 ENCOUNTER — Encounter: Payer: Self-pay | Admitting: Adult Health

## 2023-04-20 ENCOUNTER — Ambulatory Visit (INDEPENDENT_AMBULATORY_CARE_PROVIDER_SITE_OTHER): Payer: BC Managed Care – PPO

## 2023-04-20 VITALS — BP 138/82 | HR 65 | Temp 98.2°F | Ht 67.0 in | Wt 218.0 lb

## 2023-04-20 DIAGNOSIS — M5442 Lumbago with sciatica, left side: Secondary | ICD-10-CM | POA: Diagnosis not present

## 2023-04-20 DIAGNOSIS — M5441 Lumbago with sciatica, right side: Secondary | ICD-10-CM

## 2023-04-20 DIAGNOSIS — M25551 Pain in right hip: Secondary | ICD-10-CM | POA: Diagnosis not present

## 2023-04-20 DIAGNOSIS — M5136 Other intervertebral disc degeneration, lumbar region: Secondary | ICD-10-CM | POA: Diagnosis not present

## 2023-04-20 MED ORDER — METHYLPREDNISOLONE 4 MG PO TBPK: ORAL_TABLET | ORAL | 0 refills | Status: DC

## 2023-04-20 MED ORDER — METHOCARBAMOL 750 MG PO TABS: 750.0000 mg | ORAL_TABLET | Freq: Three times a day (TID) | ORAL | 0 refills | Status: DC | PRN

## 2023-04-20 NOTE — Progress Notes (Addendum)
Subjective:    Patient ID: Karen Pope, female    DOB: [birth year], 62 y.o.   MRN: [id number]  HPI  62 year old female who  has a past medical history of Allergy, Arthritis, Cataract, Claustrophobia, essential hypertension, Lump or mass in breast, Obesity, unspecified, Other disorder of menstruation and other abnormal bleeding from female genital tract, Other speech disturbance(784.59), Urinary incontinence, and Urticaria, unspecified.  She presents to the office today for low back pain. Two weeks ago she bent over to put on her underwear and she felt her back tighten up. She was improving throughout the week then a week ago she  bent over to put her dogs leash on and her low back seized up again ( second time was worse then the first)  When the second instance happened she had multiple days of urinary incontinence ( resolved) numbness and tingling in legs ( better but not resolved - still in right toes), had to walk with a cane for ambulation but is no longer needing to do so.  She does continue to still have pain and numbness in her lower back and buttocks but no saddle anesthesia.  At home she is using Tylenol and Aleve.  She has not been using a heating pad.  Reports pain today is 6/10 - when the second incident happened it was " 20/10"   Still having pain with walking, sitting , bending/twisting motions, and getting out of bed.   Addittionly, she reports for quite a while she has had pain in her right hip.  The pain seems to be getting somewhat worse as time goes on.  Pain is located on the outside of the hip as well as in the right groin.  She will often feel "catching" sensation with walking.    Review of Systems See HPI   Past Medical History:  Diagnosis Date   Allergy    Arthritis    knees   Cataract    forming both eyes   Claustrophobia    essential hypertension    Lump or mass in breast    Obesity, unspecified    Other disorder of menstruation and other abnormal  bleeding from female genital tract    Other speech disturbance(784.59)    Urinary incontinence    Urticaria, unspecified     Social History   Socioeconomic History   Marital status: Married    Spouse name: Not on file   Number of children: Not on file   Years of education: Not on file   Highest education level: Master's degree (e.g., MA, MS, MEng, MEd, MSW, MBA)  Occupational History   Not on file  Tobacco Use   Smoking status: Never   Smokeless tobacco: Never  Vaping Use   Vaping Use: Never used  Substance and Sexual Activity   Alcohol use: Yes    Comment: rare- 1 x q 4 months   Drug use: No   Sexual activity: Not on file  Other Topics Concern   Not on file  Social History Narrative   Not on file   Social Determinants of Health   Financial Resource Strain: Low Risk  (01/06/2023)   Overall Financial Resource Strain (CARDIA)    Difficulty of Paying Living Expenses: Not very hard  Food Insecurity: No Food Insecurity (01/06/2023)   Hunger Vital Sign    Worried About Running Out of Food in the Last Year: Never true    Ran Out of Food in the Last Year: Never  true  Transportation Needs: No Transportation Needs (01/06/2023)   PRAPARE - Administrator, Civil Service (Medical): No    Lack of Transportation (Non-Medical): No  Physical Activity: Insufficiently Active (01/06/2023)   Exercise Vital Sign    Days of Exercise per Week: 1 day    Minutes of Exercise per Session: 10 min  Stress: No Stress Concern Present (01/06/2023)   [employer] of Occupational Health - Occupational Stress Questionnaire    Feeling of Stress : Not at all  Social Connections: Unknown (01/06/2023)   Social Connection and Isolation Panel [NHANES]    Frequency of Communication with Friends and Family: Three times a week    Frequency of Social Gatherings with Friends and Family: Patient declined    Attends Religious Services: Patient declined    Active Member of Clubs or Organizations: Yes     Attends Engineer, structural: More than 4 times per year    Marital Status: Not on file  Intimate Partner Violence: Not on file    Past Surgical History:  Procedure Laterality Date   BREAST BIOPSY     benign    CHOLECYSTECTOMY N/A 10/07/2018   Procedure: LAPAROSCOPIC CHOLECYSTECTOMY WITH INTRAOPERATIVE CHOLANGIOGRAM;  Surgeon: Darnell Level, MD;  Location: [facility];  Service: General;  Laterality: N/A;   COLONOSCOPY     hysteroscopy  12/02/2022   TONSILLECTOMY  1979   TUBAL LIGATION  1997   WISDOM TOOTH EXTRACTION     1980's    Family History  Problem Relation Age of Onset   Breast cancer Mother 50   Leukemia Maternal Aunt        leukemia- type cancer   Breast cancer Maternal Aunt 87   Prostate cancer Maternal Uncle    Cancer Paternal Aunt        either uterine or cervical cancer   Pancreatic cancer Cousin        60    Prostate cancer Half-Brother    Colon cancer Other        in his 23's   Pancreatic cancer Other        in his 39's   Colon polyps Neg Hx    Esophageal cancer Neg Hx    Rectal cancer Neg Hx    Stomach cancer Neg Hx     Allergies  Allergen Reactions   Lisinopril Swelling   Thimerosal (Thiomersal) Itching    Current Outpatient Medications on File Prior to Visit  Medication Sig Dispense Refill   acetaminophen (TYLENOL) 500 MG tablet Take 1,000 mg by mouth every 6 (six) hours as needed for moderate pain.     albuterol (VENTOLIN HFA) 108 (90 Base) MCG/ACT inhaler INHALE 2 PUFFS BY MOUTH EVERY 6 HOURS AS NEEDED 8.5 each 1   ALPRAZolam (XANAX) 0.25 MG tablet Take 1 tablet (0.25 mg total) by mouth at bedtime. 30 tablet 0   AMBULATORY NON FORMULARY MEDICATION Medication Name: Diltiazem 2 % gel - Using your index finger, apply a small amount of medication inside the rectum up to your first knuckle/joint three times daily x 6-8 weeks. 30 g 0   amLODipine (NORVASC) 5 MG tablet TAKE 1 TABLET (5 MG TOTAL) BY MOUTH DAILY. 90 tablet 0   Cholecalciferol  (VITAMIN D3) 2000 units TABS Take 2,000 Units by mouth every other day.      escitalopram (LEXAPRO) 20 MG tablet Take 1 tablet (20 mg total) by mouth daily. 90 tablet 1   fluticasone (FLONASE) 50 MCG/ACT nasal spray  Place 1 spray into both nostrils daily. 16 g 0   hydrochlorothiazide (HYDRODIURIL) 12.5 MG tablet Take 1 tablet (12.5 mg total) by mouth daily. 90 tablet 3   ibuprofen (ADVIL,MOTRIN) 200 MG tablet Take 200-400 mg by mouth every 6 (six) hours as needed for moderate pain.     levonorgestrel (MIRENA, 52 MG,) 20 MCG/DAY IUD      Multiple Vitamin (MULTIVITAMIN) tablet Take 1 tablet by mouth daily.     nystatin-triamcinolone (MYCOLOG II) cream APP EXT AA BID FOR 7 TO 14 DAYS     oxybutynin (DITROPAN-XL) 5 MG 24 hr tablet Take 1 tablet by mouth daily.     Polyvinyl Alcohol-Povidone (REFRESH OP) Apply to eye.     triamcinolone ointment (KENALOG) 0.1 % APPLY THIN LAYER TOPICALLY TO THE AFFECTED AREA TWICE DAILY FOR 7 DAYS AS NEEDED     Vitamin D, Ergocalciferol, (DRISDOL) 1.25 MG (50000 UNIT) CAPS capsule Take 1 capsule (50,000 Units total) by mouth every 7 (seven) days. 25 capsule 0   No current facility-administered medications on file prior to visit.    BP 138/82   Pulse 65   Temp 98.2 F (36.8 C) (Oral)   Ht 5\' 7"  (1.702 m)   Wt 218 lb (98.9 kg)   LMP 10/27/2019 (Exact Date)   SpO2 98%   BMI 34.14 kg/m       Objective:   Physical Exam Vitals and nursing note reviewed.  Constitutional:      Appearance: Normal appearance.  Cardiovascular:     Rate and Rhythm: Normal rate and regular rhythm.     Pulses: Normal pulses.     Heart sounds: Normal heart sounds.  Pulmonary:     Effort: Pulmonary effort is normal.     Breath sounds: Normal breath sounds.  Musculoskeletal:        General: Tenderness present.     Lumbar back: Tenderness and bony tenderness present. Decreased range of motion.     Right hip: Bony tenderness present. No crepitus. Normal strength.     Left hip:  Normal.     Comments: To have some discomfort in the right groin with knee-to-chest and internal and external rotation.  Skin:    General: Skin is warm and dry.     Capillary Refill: Capillary refill takes less than 2 seconds.  Neurological:     General: No focal deficit present.     Mental Status: She is alert and oriented to person, place, and time.  Psychiatric:        Mood and Affect: Mood normal.        Behavior: Behavior normal.        Thought Content: Thought content normal.        Judgment: Judgment normal.        Assessment & Plan:  1. Acute midline low back pain with bilateral sciatica -Due to urinary incontinence there is concern for cauda equina syndrome.  Will order MRI after lumbar x-ray is complete.  In the meantime we will provide Robaxin and Medrol Dosepak. - DG Lumbar Spine Complete; Future - methocarbamol (ROBAXIN-750) 750 MG tablet; Take 1 tablet (750 mg total) by mouth every 8 (eight) hours as needed for muscle spasms.  Dispense: 20 tablet; Refill: 0 - methylPREDNISolone (MEDROL DOSEPAK) 4 MG TBPK tablet; Take as directed  Dispense: 21 tablet; Refill: 0  2. Right hip pain - Concern for labral tear. Will order xray and will liekly need MRI in the future.  - DG HIP  UNILAT W OR W/O PELVIS 2-3 VIEWS RIGHT; Future - methocarbamol (ROBAXIN-750) 750 MG tablet; Take 1 tablet (750 mg total) by mouth every 8 (eight) hours as needed for muscle spasms.  Dispense: 20 tablet; Refill: 0 - methylPREDNISolone (MEDROL DOSEPAK) 4 MG TBPK tablet; Take as directed  Dispense: 21 tablet; Refill: 0  Shirline Frees, NP  Time spent with patient today was 32 minutes which consisted of chart review, discussing low back pain and right hip pain,  work up, treatment answering questions and documentation.

## 2023-04-26 ENCOUNTER — Telehealth: Payer: Self-pay | Admitting: Adult Health

## 2023-04-26 DIAGNOSIS — M5441 Lumbago with sciatica, right side: Secondary | ICD-10-CM

## 2023-04-26 DIAGNOSIS — M25551 Pain in right hip: Secondary | ICD-10-CM

## 2023-04-26 NOTE — Telephone Encounter (Signed)
Updated patient on her xray results. She continues to have right hip pain and low back pain with numbness and tingling in her toes.   Will order MRI fur further evaluation

## 2023-05-01 ENCOUNTER — Telehealth: Payer: Self-pay | Admitting: Nurse Practitioner

## 2023-05-01 NOTE — Telephone Encounter (Signed)
Patient would like to use husbands previous CPAP machine. Brand is [employer]. Patient phone number is [phone].

## 2023-05-03 ENCOUNTER — Other Ambulatory Visit: Payer: Self-pay | Admitting: Family Medicine

## 2023-05-03 DIAGNOSIS — H6502 Acute serous otitis media, left ear: Secondary | ICD-10-CM

## 2023-05-05 NOTE — Telephone Encounter (Signed)
Called and spoke with pt about her spouse's old cpap machine. Asked her how old his machine is and she stated to me that his old machine is at least 64 years old. I stated to pt with his machine being at least 64 years old, that it is not recommended for her to use his old machine. Pt then said she had sent a mychart message and got a different response then what I just told her. I reviewed her mychart message and told her that the reason why she got a different response in the mychart message than what I just told her was for the reason that she did not state in the mychart message that her husband's old machine was at least 44 years old.  Told pt again that it would not be recommended for her to use her husband's old machine and told her that she would need to go with DME with order that we placed for her and she verbalized understanding. Nothing further needed.

## 2023-05-11 ENCOUNTER — Other Ambulatory Visit: Payer: Self-pay | Admitting: Adult Health

## 2023-05-11 DIAGNOSIS — H6502 Acute serous otitis media, left ear: Secondary | ICD-10-CM

## 2023-05-22 ENCOUNTER — Ambulatory Visit: Payer: BC Managed Care – PPO | Admitting: Nurse Practitioner

## 2023-05-23 ENCOUNTER — Other Ambulatory Visit: Payer: Self-pay | Admitting: Adult Health

## 2023-05-23 DIAGNOSIS — I10 Essential (primary) hypertension: Secondary | ICD-10-CM

## 2023-05-29 DIAGNOSIS — G4733 Obstructive sleep apnea (adult) (pediatric): Secondary | ICD-10-CM | POA: Diagnosis not present

## 2023-06-08 ENCOUNTER — Ambulatory Visit (HOSPITAL_BASED_OUTPATIENT_CLINIC_OR_DEPARTMENT_OTHER)
Admission: RE | Admit: 2023-06-08 | Discharge: 2023-06-08 | Disposition: A | Discharge status: Home / Self Care | Payer: BC Managed Care – PPO | Source: Ambulatory Visit | Attending: Adult Health | Admitting: Adult Health

## 2023-06-08 DIAGNOSIS — S73191A Other sprain of right hip, initial encounter: Secondary | ICD-10-CM | POA: Diagnosis not present

## 2023-06-08 DIAGNOSIS — M4856XA Collapsed vertebra, not elsewhere classified, lumbar region, initial encounter for fracture: Secondary | ICD-10-CM | POA: Diagnosis not present

## 2023-06-08 DIAGNOSIS — M545 Low back pain, unspecified: Secondary | ICD-10-CM | POA: Diagnosis not present

## 2023-06-08 DIAGNOSIS — M25551 Pain in right hip: Secondary | ICD-10-CM | POA: Diagnosis not present

## 2023-06-08 DIAGNOSIS — M5136 Other intervertebral disc degeneration, lumbar region: Secondary | ICD-10-CM | POA: Diagnosis not present

## 2023-06-08 DIAGNOSIS — M5441 Lumbago with sciatica, right side: Secondary | ICD-10-CM | POA: Insufficient documentation

## 2023-06-08 DIAGNOSIS — M5442 Lumbago with sciatica, left side: Secondary | ICD-10-CM | POA: Insufficient documentation

## 2023-06-08 DIAGNOSIS — M160 Bilateral primary osteoarthritis of hip: Secondary | ICD-10-CM | POA: Diagnosis not present

## 2023-06-19 ENCOUNTER — Other Ambulatory Visit: Payer: Self-pay | Admitting: Obstetrics and Gynecology

## 2023-06-19 DIAGNOSIS — Z1231 Encounter for screening mammogram for malignant neoplasm of breast: Secondary | ICD-10-CM

## 2023-06-21 ENCOUNTER — Telehealth: Payer: Self-pay

## 2023-06-21 DIAGNOSIS — M1990 Unspecified osteoarthritis, unspecified site: Secondary | ICD-10-CM

## 2023-06-21 NOTE — Telephone Encounter (Signed)
Pt notified of update of MRI and verbalized understanding.

## 2023-06-26 ENCOUNTER — Ambulatory Visit: Payer: BC Managed Care – PPO

## 2023-06-26 ENCOUNTER — Ambulatory Visit: Payer: BC Managed Care – PPO | Admitting: Nurse Practitioner

## 2023-06-26 ENCOUNTER — Encounter: Payer: Self-pay | Admitting: Nurse Practitioner

## 2023-06-26 VITALS — BP 128/84 | HR 53 | Ht 67.0 in | Wt 225.0 lb

## 2023-06-26 DIAGNOSIS — E669 Obesity, unspecified: Secondary | ICD-10-CM

## 2023-06-26 DIAGNOSIS — G4733 Obstructive sleep apnea (adult) (pediatric): Secondary | ICD-10-CM

## 2023-06-26 NOTE — Assessment & Plan Note (Signed)
BMI 35. Healthy weight loss encouraged.  

## 2023-06-26 NOTE — Patient Instructions (Signed)
Increase CPAP usage to every night, minimum of 4-6 hours a night.  Change equipment every 30 days or as directed by DME. Wash your tubing with warm soap and water daily, hang to dry. Wash humidifier portion weekly. Use bottled, distilled water and change daily  Be aware of reduced alertness and do not drive or operate heavy machinery if experiencing this or drowsiness.  Exercise encouraged, as tolerated. Healthy weight management discussed.  Notify if persistent daytime sleepiness occurs even with consistent use of CPAP.  We discussed how untreated sleep apnea puts an individual at risk for cardiac arrhthymias, pulm HTN, DM, stroke and increases their risk for daytime accidents.  I will adjust your CPAP settings to 8-20 cmH2O and switch you to a full face mask Try setting an alarm on your phone that goes off at the same time every night to remind you to get off the couch and go to the bed  Follow up in 6 weeks with Karen Pope or Karen Addison Ashauna Bertholf,NP. If symptoms do not improve or worsen, please contact office for sooner follow up

## 2023-06-26 NOTE — Assessment & Plan Note (Signed)
Moderate OSA on CPAP. Suboptimal compliance. Discussed measures to increase usage. We will also adjust her CPAP settings to 8-20 cmH2O and switch her to a full face mask. Reviewed risks of untreated moderate OSA. She does receive benefit from use of CPAP so she would like to continue with it. Aware of safe driving practices.   Patient Instructions  Increase CPAP usage to every night, minimum of 4-6 hours a night.  Change equipment every 30 days or as directed by DME. Wash your tubing with warm soap and water daily, hang to dry. Wash humidifier portion weekly. Use bottled, distilled water and change daily  Be aware of reduced alertness and do not drive or operate heavy machinery if experiencing this or drowsiness.  Exercise encouraged, as tolerated. Healthy weight management discussed.  Notify if persistent daytime sleepiness occurs even with consistent use of CPAP.  We discussed how untreated sleep apnea puts an individual at risk for cardiac arrhthymias, pulm HTN, DM, stroke and increases their risk for daytime accidents.  I will adjust your CPAP settings to 8-20 cmH2O and switch you to a full face mask Try setting an alarm on your phone that goes off at the same time every night to remind you to get off the couch and go to the bed  Follow up in 6 weeks with Dr. Wynona Neat or Florentina Addison Amalea Ottey,NP. If symptoms do not improve or worsen, please contact office for sooner follow up

## 2023-06-26 NOTE — Progress Notes (Signed)
@Patient  ID: Karen Pope, female    DOB: 1961/09/04, 62 y.o.   MRN: [id number]  Chief Complaint  Patient presents with   Follow-up    F/U for OSA. States she has not sleeping well at night, thus not using her machine night. When she is using the machine, she is finding she is taking the mask off in her sleep. Denies any increased headaches, nosebleeds or stomach discomfort.     Referring provider: Shirline Frees, NP  HPI: 62 year old female, never smoker followed for moderate OSA. Past medical history significant for HTN, obesity.   TEST/EVENTS:  03/08/2023 HST: AHI 20.2/h, SpO2 low 79%  01/30/2023: OV with Chamille Werntz NP for sleep consult referred by Shirline Frees, NP. She had a gynecologic procedure recently and afterwards, she was advised by the anesthesiologist to seek further evaluation as they were concerned she may have sleep apnea. She had very loud snoring and changes in her breathing pattern. She has also been told by her husband that she snores loudly and has shallow breathing at night. She has slept talked in the past. She feels tired during the day and wakes feeling poorly rested. She wakes with morning headaches and a dry mouth in the morning. She has never fallen asleep while driving but she used to do home visits and when she would park somewhere to chart, she would doze off easily. She also tells me that she's having some trouble with jaw discomfort on the left side. Her ear was bothering her as well. They treated her with augmentin and doxycycline with some improvement. She still notices that her jaw is tight and painful at times. When she opens her mouth, she occasionally notices a popping sensation. She is going to be seeing her dentist soon. She denies sleep walking, sleep paralysis. No history of narcolepsy or symptoms of cataplexy.  She goes to sleep between midnight and 1 am. Falls asleep quickly, within 10 min. Doesn't tend to wake through the night. Wakes around 730-8 am. 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 She is primarily working from home now. Her weight is down 10 lb over the past two years. She has never had a sleep study before.  She has a history of HTN, HLD, and allergies. No history of stroke. She is a never smoker. Rarely drinks alcohol. No sleep aids. 1-2 caffeinated  beverages a day. Lives with her husband. She has 3 children. Family history of heart disease and breast cancer [place] 15  04/10/2023: OV with Durant Scibilia NP for follow up to discuss sleep study results, which revealed moderate OSA. She continues to have trouble with restless sleep and feeling tired during the day. She's more tired now than she was before she started working from home. She is hesitant about CPAP therapy because she has some claustrophobia. Willing to try it.   06/26/2023: Today - follow up Patient presents today for follow up after starting on CPAP. She has had some trouble adjusting to use. She also has poor sleep habits, including falling asleep on the couch and then she ends up not wearing her CPAP much that night. She does have some nights she barely sleeps because of work. She otherwise doesn't feel like she has trouble staying asleep or falling asleep. She does notice that sometimes she takes her mask off part way through the night and will wake up without it on. This got better when she  switched from a nasal pillow mask to a full face but then she started feeling like she wasn't getting enough air at times. When she does get in the bed and wear her CPAP the entire night, she feels better the next day. Doesn't wake up with a headache. Wakes feeling more refreshed. Denies any sleep parasomnias/paralysis. She uses caution when she drives and will pull over if she becomes sleepy.  05/24/2023-06/22/2023: CPAP 5-15 cmH2O 18/30 days; 13% >4 hr; average use 2 hr 49 min Pressure 95th 10.2 Leaks 95th 8.4 AHI 3.5  Allergies  Allergen  Reactions   Lisinopril Swelling   Thimerosal (Thiomersal) Itching    Immunization History  Administered Date(s) Administered   Hep B, Unspecified 06/29/1977, 06/11/1982   Influenza Split 09/11/2013   Influenza Whole 12/13/2007   Influenza,inj,Quad PF,6+ Mos 08/29/2017, 08/30/2018, 10/11/2021, 10/11/2022   PPD Test 07/23/2014   Td 12/12/1998, 07/06/2009   Tdap 07/31/2019   Unspecified SARS-COV-2 Vaccination 12/30/2019, 01/17/2020    Past Medical History:  Diagnosis Date   Allergy    Arthritis    knees   Cataract    forming both eyes   Claustrophobia    essential hypertension    Lump or mass in breast    Obesity, unspecified    Other disorder of menstruation and other abnormal bleeding from female genital tract    Other speech disturbance(784.59)    Urinary incontinence    Urticaria, unspecified     Tobacco History: Social History   Tobacco Use  Smoking Status Never  Smokeless Tobacco Never   Counseling given: Not Answered   Outpatient Medications Prior to Visit  Medication Sig Dispense Refill   acetaminophen (TYLENOL) 500 MG tablet Take 1,000 mg by mouth every 6 (six) hours as needed for moderate pain.     albuterol (VENTOLIN HFA) 108 (90 Base) MCG/ACT inhaler INHALE 2 PUFFS BY MOUTH EVERY 6 HOURS AS NEEDED 8.5 each 1   AMBULATORY NON FORMULARY MEDICATION Medication Name: Diltiazem 2 % gel - Using your index finger, apply a small amount of medication inside the rectum up to your first knuckle/joint three times daily x 6-8 weeks. 30 g 0   amLODipine (NORVASC) 5 MG tablet TAKE 1 TABLET (5 MG TOTAL) BY MOUTH DAILY. 90 tablet 0   Cholecalciferol (VITAMIN D3) 2000 units TABS Take 2,000 Units by mouth every other day.      escitalopram (LEXAPRO) 20 MG tablet Take 1 tablet (20 mg total) by mouth daily. 90 tablet 1   fluticasone (FLONASE) 50 MCG/ACT nasal spray INSTILL 1 SPRAY INTO BOTH NOSTRILS DAILY (Patient taking differently: as needed.) 24 mL 1   hydrochlorothiazide  (HYDRODIURIL) 12.5 MG tablet Take 1 tablet (12.5 mg total) by mouth daily. 90 tablet 3   ibuprofen (ADVIL,MOTRIN) 200 MG tablet Take 200-400 mg by mouth every 6 (six) hours as needed for moderate pain.     levonorgestrel (MIRENA, 52 MG,) 20 MCG/DAY IUD      methocarbamol (ROBAXIN-750) 750 MG tablet Take 1 tablet (750 mg total) by mouth every 8 (eight) hours as needed for muscle spasms. 20 tablet 0   Multiple Vitamin (MULTIVITAMIN) tablet Take 1 tablet by mouth daily.     nystatin-triamcinolone (MYCOLOG II) cream APP EXT AA BID FOR 7 TO 14 DAYS     oxybutynin (DITROPAN-XL) 5 MG 24 hr tablet Take 1 tablet by mouth as needed.     Polyvinyl Alcohol-Povidone (REFRESH OP) Apply to eye.     triamcinolone ointment (KENALOG) 0.1 %  APPLY THIN LAYER TOPICALLY TO THE AFFECTED AREA TWICE DAILY FOR 7 DAYS AS NEEDED     ALPRAZolam (XANAX) 0.25 MG tablet Take 1 tablet (0.25 mg total) by mouth at bedtime. 30 tablet 0   methylPREDNISolone (MEDROL DOSEPAK) 4 MG TBPK tablet Take as directed 21 tablet 0   No facility-administered medications prior to visit.     Review of Systems:   Constitutional: No weight loss or gain, night sweats, fevers, chills, or lassitude. +excessive daytime fatigue (improves with CPAP) HEENT: No difficulty swallowing, tooth/dental problems. No sneezing, itching, nasal congestion, or post nasal drip. +AM dry mouth, headaches, jaw pain/left sided sore throat, left ear ache (improved) CV:  No chest pain, orthopnea, PND, swelling in lower extremities, anasarca, dizziness, palpitations, syncope Resp: +snoring (resolves with CPAP), witnessed apneas, shortness of breath with exertion (baseline), chronic dry cough. No excess mucus or change in color of mucus. No hemoptysis. No wheezing.  No chest wall deformity GI:  No heartburn, indigestion, abdominal pain, nausea, vomiting, diarrhea, change in bowel habits, loss of appetite GU: No dysuria, change in color of urine, urgency or frequency.    Skin: No rash, lesions, ulcerations MSK:  No joint pain or swelling.   Neuro: No dizziness or lightheadedness.  Psych: No depression or anxiety. Mood stable.     Physical Exam:  BP 128/84   Pulse (!) 53   Ht 5\' 7"  (1.702 m)   Wt 225 lb (102.1 kg)   LMP 10/27/2019 (Exact Date)   SpO2 96% Comment: on RA  BMI 35.24 kg/m   GEN: Pleasant, interactive, well-appearing; obese; in no acute distress. HEENT:  Normocephalic and atraumatic. PERRLA. Sclera white. Nasal turbinates pink, moist and patent bilaterally. No rhinorrhea present. Oropharynx pink and moist, without exudate or edema. No lesions, ulcerations, or postnasal drip.  NECK:  Supple w/ fair ROM. No JVD present. Normal carotid impulses w/o bruits. Thyroid symmetrical with no goiter or nodules palpated. No lymphadenopathy.  Mallampati II CV: RRR, no m/r/g, no peripheral edema. Pulses intact, +2 bilaterally. No cyanosis, pallor or clubbing. PULMONARY:  Unlabored, regular breathing. Clear bilaterally A&P w/o wheezes/rales/rhonchi. No accessory muscle use.  GI: BS present and normoactive. Soft, non-tender to palpation. No organomegaly or masses detected.  MSK: No erythema, warmth or tenderness. Cap refil <2 sec all extrem. No deformities or joint swelling noted.  Neuro: A/Ox3. No focal deficits noted.   Skin: Warm, no lesions or rashe Psych: Normal affect and behavior. Judgement and thought content appropriate.     Lab Results:  CBC    Component Value Date/Time   WBC 7.0 12/08/2022 0812   RBC 4.84 12/08/2022 0812   HGB 13.2 12/08/2022 0812   HCT 40.8 12/08/2022 0812   PLT 245.0 12/08/2022 0812   MCV 84.4 12/08/2022 0812   MCH 26.6 10/05/2018 2357   MCHC 32.5 12/08/2022 0812   RDW 14.6 12/08/2022 0812   LYMPHSABS 2.3 12/08/2022 0812   MONOABS 0.6 12/08/2022 0812   EOSABS 0.2 12/08/2022 0812   BASOSABS 0.0 12/08/2022 0812    BMET    Component Value Date/Time   NA 142 12/08/2022 0812   K 4.0 12/08/2022 0812   CL 106  12/08/2022 0812   CO2 30 12/08/2022 0812   GLUCOSE 98 12/08/2022 0812   BUN 19 12/08/2022 0812   CREATININE 0.97 12/08/2022 0812   CALCIUM 10.8 (H) 12/08/2022 0812   GFRNONAA 52 (L) 10/05/2018 2357   GFRAA 60 (L) 10/05/2018 2357    BNP No results found  for: "BNP"   Imaging:  MR HIP RIGHT WO CONTRAST  Result Date: 06/16/2023 CLINICAL DATA:  Chronic right hip pain. EXAM: MR OF THE RIGHT HIP WITHOUT CONTRAST TECHNIQUE: Multiplanar, multisequence MR imaging was performed. No intravenous contrast was administered. COMPARISON:  Radiographs 04/20/2023 FINDINGS: Both hips are normally located. No hip fracture or AVN. Moderate bilateral hip joint degenerative changes with degenerative chondrosis and early joint space narrowing and spurring. Cystic changes near the head neck junction region of the right hip can be seen with femoroacetabular impingement. Degenerated and torn superior anterior labrum. Bilateral peritrochanteric tendinosis without bursitis, right greater than left. The hip and pelvic musculature are grossly normal. No muscle tear, myositis or mass. No significant intrapelvic abnormalities are identified. The uterus is retroverted. An IUD is noted in the endometrial canal. IMPRESSION: 1. Moderate bilateral hip joint degenerative changes but no hip fracture or AVN. 2. Degenerated and torn superior anterior labrum, right hip. 3. Bilateral peritrochanteric tendinosis without bursitis, right greater than left. Electronically Signed   By: Rudie Meyer M.D.   On: 06/16/2023 08:18   MR Lumbar Spine Wo Contrast  Result Date: 06/16/2023 CLINICAL DATA:  Low back pain with cauda equina syndrome suspected EXAM: MRI LUMBAR SPINE WITHOUT CONTRAST TECHNIQUE: Multiplanar, multisequence MR imaging of the lumbar spine was performed. No intravenous contrast was administered. COMPARISON:  None Available. FINDINGS: Segmentation:  Standard. Alignment:  Physiologic. Vertebrae:  No fracture, evidence of discitis, or  bone lesion. Conus medullaris and cauda equina: Conus extends to the T12-L1 level. Conus and cauda equina appear normal. Paraspinal and other soft tissues: Negative for perispinal mass or inflammation Disc levels: T12- L1: Unremarkable. L1-L2: Unremarkable. L2-L3: Mild disc height loss and bulging. L3-L4: Unremarkable. L4-L5: Greatest level of disc height loss with desiccation and bulging. There is posterior annular fissuring and mild facet spurring. Mild triangular narrowing of the thecal sac without static L5 compression. L5-S1:Mild facet spurring. IMPRESSION: L2-3 and L4-5 disc degeneration without neural compression. Electronically Signed   By: Tiburcio Pea M.D.   On: 06/16/2023 04:07    Administration History     None           No data to display          No results found for: "NITRICOXIDE"      Assessment & Plan:   Moderate obstructive sleep apnea Moderate OSA on CPAP. Suboptimal compliance. Discussed measures to increase usage. We will also adjust her CPAP settings to 8-20 cmH2O and switch her to a full face mask. Reviewed risks of untreated moderate OSA. She does receive benefit from use of CPAP so she would like to continue with it. Aware of safe driving practices.   Patient Instructions  Increase CPAP usage to every night, minimum of 4-6 hours a night.  Change equipment every 30 days or as directed by DME. Wash your tubing with warm soap and water daily, hang to dry. Wash humidifier portion weekly. Use bottled, distilled water and change daily  Be aware of reduced alertness and do not drive or operate heavy machinery if experiencing this or drowsiness.  Exercise encouraged, as tolerated. Healthy weight management discussed.  Notify if persistent daytime sleepiness occurs even with consistent use of CPAP.  We discussed how untreated sleep apnea puts an individual at risk for cardiac arrhthymias, pulm HTN, DM, stroke and increases their risk for daytime accidents.  I  will adjust your CPAP settings to 8-20 cmH2O and switch you to a full face mask Try setting an  alarm on your phone that goes off at the same time every night to remind you to get off the couch and go to the bed  Follow up in 6 weeks with Dr. Wynona Neat or Philis Nettle. If symptoms do not improve or worsen, please contact office for sooner follow up    Obesity (BMI 30-39.9) BMI 35. Healthy weight loss encouraged.    I spent 35 minutes of dedicated to the care of this patient on the date of this encounter to include pre-visit review of records, face-to-face time with the patient discussing conditions above, post visit ordering of testing, clinical documentation with the electronic health record, making appropriate referrals as documented, and communicating necessary findings to members of the patients care team.  Noemi Chapel, NP 06/26/2023  Pt aware and understands NP's role.

## 2023-06-28 DIAGNOSIS — G4733 Obstructive sleep apnea (adult) (pediatric): Secondary | ICD-10-CM | POA: Diagnosis not present

## 2023-07-02 ENCOUNTER — Other Ambulatory Visit: Payer: Self-pay | Admitting: Adult Health

## 2023-07-02 DIAGNOSIS — F32A Depression, unspecified: Secondary | ICD-10-CM

## 2023-07-03 ENCOUNTER — Encounter: Payer: Self-pay | Admitting: Physician Assistant

## 2023-07-03 ENCOUNTER — Ambulatory Visit (INDEPENDENT_AMBULATORY_CARE_PROVIDER_SITE_OTHER): Payer: BC Managed Care – PPO | Admitting: Physician Assistant

## 2023-07-03 VITALS — Ht 67.0 in | Wt 225.0 lb

## 2023-07-03 DIAGNOSIS — M1611 Unilateral primary osteoarthritis, right hip: Secondary | ICD-10-CM | POA: Diagnosis not present

## 2023-07-03 DIAGNOSIS — M5116 Intervertebral disc disorders with radiculopathy, lumbar region: Secondary | ICD-10-CM | POA: Diagnosis not present

## 2023-07-03 DIAGNOSIS — M7061 Trochanteric bursitis, right hip: Secondary | ICD-10-CM | POA: Diagnosis not present

## 2023-07-03 MED ORDER — LIDOCAINE HCL 1 % IJ SOLN
3.0000 mL | INTRAMUSCULAR | Status: AC | PRN
Start: 2023-07-03 — End: 2023-07-03
  Administered 2023-07-03: 3 mL

## 2023-07-03 MED ORDER — METHYLPREDNISOLONE ACETATE 40 MG/ML IJ SUSP
40.0000 mg | INTRAMUSCULAR | Status: AC | PRN
  Administered 2023-07-03: 40 mg via INTRA_ARTICULAR

## 2023-07-03 NOTE — Progress Notes (Signed)
Office Visit Note   Patient: Karen Pope           Date of Birth: Jun 13, 1961           MRN: [id number] Visit Date: 07/03/2023              Requested by: Shirline Frees, NP [address] [place],  [place] [address] PCP: Shirline Frees, NP   Assessment & Plan: Visit Diagnoses:  1. Trochanteric bursitis, right hip   2. Primary osteoarthritis of right hip   3. Intervertebral disc disorders with radiculopathy, lumbar region     Plan: Due to her pain primarily being the lateral aspect of the right hip recommended trochanteric injection today.  She is shown IT band stretching exercises.  She may benefit from an intra-articular injection in the right hip.  Will see her back in 2 weeks see what type of response she had at today's injection.  Questions were encouraged and answered at length.  Follow-Up Instructions: Return in about 2 weeks (around 07/17/2023).   Orders:  Orders Placed This Encounter  Procedures   Large Joint Inj   No orders of the defined types were placed in this encounter.     Procedures: Large Joint Inj: R greater trochanter on 07/03/2023 1:09 PM Indications: pain Details: 22 G 1.5 in needle, lateral approach  Arthrogram: No  Medications: 3 mL lidocaine 1 %; 40 mg methylPREDNISolone acetate 40 MG/ML Outcome: tolerated well, no immediate complications Procedure, treatment alternatives, risks and benefits explained, specific risks discussed. Consent was given by the patient. Immediately prior to procedure a time out was called to verify the correct patient, procedure, equipment, support staff and site/side marked as required. Patient was prepped and draped in the usual sterile fashion.       Clinical Data: No additional findings.   Subjective: Chief Complaint  Patient presents with   Right Leg - Pain    HPI Karen Pope 62 year old female were seen for the first time.  She has a right hip and leg pain.  Notes groin pain on the right.  Also pain  lateral aspect of the hip and buttocks region of the right hip.  No pain on the left side.  She been complaining of pain for the last 5 months.  Was initially pulled down by her dog but has been pulled out 2 times due to to the dog pulling on its leash.  Both times she landed on the right side.  She states her right hip pain is 9 out of 10 at worst.  She has had some back pain but this has dissipated.  She states she has no back pain at this point in time.  She denies any weight change.  Denies fevers, chills, bowel or bladder dysfunction currently, saddle anesthesia.  Pain does awaken her at night if she lies on the right hip.  She notes that initially she was having some "locking up" of her back and is having difficulty getting up and down from seated position also lying down.  She did have some numbness down right leg into her calf.  Occasionally still has right second through fourth toe numbness.  She also had some urinary incontinence but this is subsided.  She sleeps with a pillow between her knees.  She notes she walks with a limp and has trouble doing steps due to the right hip pain.  She underwent an MRI of her right hip: 06/08/2023 and this was reviewed with the patient.  Images reviewed.  This shows moderate bilateral right greater than left arthritis.  Right hip degeneration and tear of the superior anterior labrum.  Bilateral trochanteric bursitis right greater than left. Lumbar spine MRI was performed on 06/07/2023.  This shows no spinal cord or foraminal stenosis.  Disc degeneration at L2-3 and L5 4 5.  At L4-5 there is an posterior annular fissuring.   Review of Systems See HPI otherwise negative  Objective: Vital Signs: Ht 5\' 7"  (1.702 m)   Wt 225 lb (102.1 kg)   LMP 10/27/2019 (Exact Date)   BMI 35.24 kg/m   Physical Exam Constitutional:      Appearance: She is not ill-appearing or diaphoretic.  Cardiovascular:     Pulses: Normal pulses.  Neurological:     Mental Status: She is  alert and oriented to person, place, and time.  Psychiatric:        Mood and Affect: Mood normal.        Behavior: Behavior normal.     Ortho Exam Lower extremities 5 out of 5 strength throughout the lower extremities against resistance.  Negative straight leg raise bilaterally.  She has good flexion extension lumbar spine without pain.  Nontender over the paraspinous region lumbar spine.  Bilateral feet dorsal pedal pulses are 2+ and equal symmetric.  Slight decrease sensation first webspace on the right side only with light touch. Bilateral hips: Good range of motion of both hips.  Right hip tenderness over the trochanteric region no tenderness over the left trochanteric region. Specialty Comments:  No specialty comments available.  Imaging: No results found.   PMFS History: Patient Active Problem List   Diagnosis Date Noted   Moderate obstructive sleep apnea 04/10/2023   Obesity (BMI 30-39.9) 04/10/2023   Excessive daytime sleepiness 01/30/2023   TMJ (temporomandibular joint disorder) 01/30/2023   Acute cholecystitis s/p lap cholecystectomy 10/07/2018 10/06/2018   Pelvic pain in female 05/28/2016   Other malaise and fatigue 08/26/2014   Urinary incontinence 02/26/2014   DUB (dysfunctional uterine bleeding) 02/26/2014   Obesity 02/26/2014   Breast lump on right side at 10 o'clock position 02/06/2013   HTN (hypertension) 12/26/2011   Past Medical History:  Diagnosis Date   Allergy    Arthritis    knees   Cataract    forming both eyes   Claustrophobia    essential hypertension    Lump or mass in breast    Obesity, unspecified    Other disorder of menstruation and other abnormal bleeding from female genital tract    Other speech disturbance(784.59)    Urinary incontinence    Urticaria, unspecified     Family History  Problem Relation Age of Onset   Breast cancer Mother 12   Leukemia Maternal Aunt        leukemia- type cancer   Breast cancer Maternal Aunt 87    Prostate cancer Maternal Uncle    Cancer Paternal Aunt        either uterine or cervical cancer   Pancreatic cancer Cousin        66    Prostate cancer Half-Brother    Colon cancer Other        in his 27's   Pancreatic cancer Other        in his 68's   Colon polyps Neg Hx    Esophageal cancer Neg Hx    Rectal cancer Neg Hx    Stomach cancer Neg Hx     Past Surgical History:  Procedure  Laterality Date   BREAST BIOPSY     benign    CHOLECYSTECTOMY N/A 10/07/2018   Procedure: LAPAROSCOPIC CHOLECYSTECTOMY WITH INTRAOPERATIVE CHOLANGIOGRAM;  Surgeon: Darnell Level, MD;  Location: [facility];  Service: General;  Laterality: N/A;   COLONOSCOPY     hysteroscopy  12/02/2022   TONSILLECTOMY  1979   TUBAL LIGATION  1997   WISDOM TOOTH EXTRACTION     1980's   Social History   Occupational History   Not on file  Tobacco Use   Smoking status: Never   Smokeless tobacco: Never  Vaping Use   Vaping status: Never Used  Substance and Sexual Activity   Alcohol use: Yes    Comment: rare- 1 x q 4 months   Drug use: No   Sexual activity: Not on file

## 2023-07-04 ENCOUNTER — Ambulatory Visit: Admission: RE | Admit: 2023-07-04 | Payer: BC Managed Care – PPO | Source: Ambulatory Visit

## 2023-07-04 DIAGNOSIS — Z1231 Encounter for screening mammogram for malignant neoplasm of breast: Secondary | ICD-10-CM | POA: Diagnosis not present

## 2023-07-07 ENCOUNTER — Other Ambulatory Visit: Payer: Self-pay | Admitting: Obstetrics and Gynecology

## 2023-07-07 DIAGNOSIS — R928 Other abnormal and inconclusive findings on diagnostic imaging of breast: Secondary | ICD-10-CM

## 2023-07-10 DIAGNOSIS — Z139 Encounter for screening, unspecified: Secondary | ICD-10-CM | POA: Diagnosis not present

## 2023-07-10 DIAGNOSIS — Z01419 Encounter for gynecological examination (general) (routine) without abnormal findings: Secondary | ICD-10-CM | POA: Diagnosis not present

## 2023-07-14 ENCOUNTER — Ambulatory Visit: Admission: RE | Admit: 2023-07-14 | Payer: BC Managed Care – PPO | Source: Ambulatory Visit

## 2023-07-14 ENCOUNTER — Ambulatory Visit
Admission: RE | Admit: 2023-07-14 | Discharge: 2023-07-14 | Disposition: A | Discharge status: Home / Self Care | Payer: BC Managed Care – PPO | Source: Ambulatory Visit | Attending: Obstetrics and Gynecology | Admitting: Obstetrics and Gynecology

## 2023-07-14 DIAGNOSIS — N6325 Unspecified lump in the left breast, overlapping quadrants: Secondary | ICD-10-CM | POA: Diagnosis not present

## 2023-07-14 DIAGNOSIS — R928 Other abnormal and inconclusive findings on diagnostic imaging of breast: Secondary | ICD-10-CM

## 2023-07-29 DIAGNOSIS — G4733 Obstructive sleep apnea (adult) (pediatric): Secondary | ICD-10-CM | POA: Diagnosis not present

## 2023-07-31 DIAGNOSIS — H2513 Age-related nuclear cataract, bilateral: Secondary | ICD-10-CM | POA: Diagnosis not present

## 2023-07-31 DIAGNOSIS — H5015 Alternating exotropia: Secondary | ICD-10-CM | POA: Diagnosis not present

## 2023-07-31 DIAGNOSIS — H532 Diplopia: Secondary | ICD-10-CM | POA: Diagnosis not present

## 2023-07-31 DIAGNOSIS — H35373 Puckering of macula, bilateral: Secondary | ICD-10-CM | POA: Diagnosis not present

## 2023-07-31 DIAGNOSIS — H5022 Vertical strabismus, left eye: Secondary | ICD-10-CM | POA: Diagnosis not present

## 2023-08-07 ENCOUNTER — Ambulatory Visit: Payer: BC Managed Care – PPO | Admitting: Nurse Practitioner

## 2023-08-07 ENCOUNTER — Encounter: Payer: Self-pay | Admitting: Nurse Practitioner

## 2023-08-07 VITALS — BP 120/70 | HR 56 | Ht 67.0 in | Wt 221.8 lb

## 2023-08-07 DIAGNOSIS — G4733 Obstructive sleep apnea (adult) (pediatric): Secondary | ICD-10-CM

## 2023-08-07 DIAGNOSIS — J069 Acute upper respiratory infection, unspecified: Secondary | ICD-10-CM | POA: Insufficient documentation

## 2023-08-07 DIAGNOSIS — E669 Obesity, unspecified: Secondary | ICD-10-CM

## 2023-08-07 DIAGNOSIS — I10 Essential (primary) hypertension: Secondary | ICD-10-CM | POA: Diagnosis not present

## 2023-08-07 NOTE — Patient Instructions (Addendum)
Increase CPAP usage to every night, minimum of 4-6 hours a night.  Change equipment every 30 days or as directed by DME. Wash your tubing with warm soap and water daily, hang to dry. Wash humidifier portion weekly. Use bottled, distilled water and change daily  Be aware of reduced alertness and do not drive or operate heavy machinery if experiencing this or drowsiness.  Exercise encouraged, as tolerated. Healthy weight management discussed.  Notify if persistent daytime sleepiness occurs even with consistent use of CPAP.   We discussed how untreated sleep apnea puts an individual at risk for cardiac arrhthymias, pulm HTN, DM, stroke and increases their risk for daytime accidents.   I will adjust your CPAP settings to 10-16 cmH2O   Mask fitting ordered   Use saline nasal rinse 1-2 times a day then follow with flonase nasal spray 2 sprays each nostril in AM about 20-30 minutes after your saline rinse You can use saline nasal gel at bedtime before putting your CPAP on    Follow up in 6 weeks with Dr. Wynona Neat or Katie Ashna Dorough,NP. If symptoms do not improve or worsen, please contact office for sooner follow up

## 2023-08-07 NOTE — Assessment & Plan Note (Signed)
Clinically improving with residual postnasal drainage and mild cough. Supportive care measures advised. She will call if symptoms worsen or do not continue to improve

## 2023-08-07 NOTE — Assessment & Plan Note (Signed)
BMI 34. Healthy weight loss encouraged.  

## 2023-08-07 NOTE — Assessment & Plan Note (Addendum)
Moderate OSA on CPAP. Suboptimal compliance. Again discussed measures to increase usage. Tighten settings to 10-16 cmH2O. Will refer her for mask desensitization study. Reviewed risks of untreated moderate OSA. She does receive benefit from use of CPAP so she would like to continue with it. Aware of safe driving practices. Sleep hygiene reviewed.   Patient Instructions  Increase CPAP usage to every night, minimum of 4-6 hours a night.  Change equipment every 30 days or as directed by DME. Wash your tubing with warm soap and water daily, hang to dry. Wash humidifier portion weekly. Use bottled, distilled water and change daily  Be aware of reduced alertness and do not drive or operate heavy machinery if experiencing this or drowsiness.  Exercise encouraged, as tolerated. Healthy weight management discussed.  Notify if persistent daytime sleepiness occurs even with consistent use of CPAP.   We discussed how untreated sleep apnea puts an individual at risk for cardiac arrhthymias, pulm HTN, DM, stroke and increases their risk for daytime accidents.   I will adjust your CPAP settings to 10-16 cmH2O   Mask fitting ordered   Use saline nasal rinse 1-2 times a day then follow with flonase nasal spray 2 sprays each nostril in AM about 20-30 minutes after your saline rinse You can use saline nasal gel at bedtime before putting your CPAP on    Follow up in 6 weeks with Dr. Wynona Neat or Katie Toria Monte,NP. If symptoms do not improve or worsen, please contact office for sooner follow up

## 2023-08-07 NOTE — Progress Notes (Signed)
@Patient  ID: Karen Pope, female    DOB: 17-Jan-1961, 62 y.o.   MRN: [id number]  Chief Complaint  Patient presents with   Follow-up    Pt is here for F/U visit today. Pt states she has no SOB, but has a little dry cough with a little nasal congestion.    Referring provider: Shirline Frees, NP  HPI: 62 year old female, never smoker followed for moderate OSA. Past medical history significant for HTN, obesity.   TEST/EVENTS:  03/08/2023 HST: AHI 20.2/h, SpO2 low 79%  01/30/2023: OV with Joletta Manner NP for sleep consult referred by Shirline Frees, NP. She had a gynecologic procedure recently and afterwards, she was advised by the anesthesiologist to seek further evaluation as they were concerned she may have sleep apnea. She had very loud snoring and changes in her breathing pattern. She has also been told by her husband that she snores loudly and has shallow breathing at night. She has slept talked in the past. She feels tired during the day and wakes feeling poorly rested. She wakes with morning headaches and a dry mouth in the morning. She has never fallen asleep while driving but she used to do home visits and when she would park somewhere to chart, she would doze off easily. She also tells me that she's having some trouble with jaw discomfort on the left side. Her ear was bothering her as well. They treated her with augmentin and doxycycline with some improvement. She still notices that her jaw is tight and painful at times. When she opens her mouth, she occasionally notices a popping sensation. She is going to be seeing her dentist soon. She denies sleep walking, sleep paralysis. No history of narcolepsy or symptoms of cataplexy.  She goes to sleep between midnight and 1 am. Falls asleep quickly, within 10 min. Doesn't tend to wake through the night. Wakes around 730-8 am. 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 She is primarily working from home now.  Her weight is down 10 lb over the past two years. She has never had a sleep study before.  She has a history of HTN, HLD, and allergies. No history of stroke. She is a never smoker. Rarely drinks alcohol. No sleep aids. 1-2 caffeinated  beverages a day. Lives with her husband. She has 3 children. Family history of heart disease and breast cancer [place] 15  04/10/2023: OV with Tiajah Oyster NP for follow up to discuss sleep study results, which revealed moderate OSA. She continues to have trouble with restless sleep and feeling tired during the day. She's more tired now than she was before she started working from home. She is hesitant about CPAP therapy because she has some claustrophobia. Willing to try it.   06/26/2023: OV with Anatalia Kronk NP for follow up after starting on CPAP. She has had some trouble adjusting to use. She also has poor sleep habits, including falling asleep on the couch and then she ends up not wearing her CPAP much that night. She does have some nights she barely sleeps because of work. She otherwise doesn't feel like she has trouble staying asleep or falling asleep. She does notice that sometimes she takes her mask off part way through the night and will wake up without it on. This got better when she switched from a nasal pillow mask to a full face but then she started feeling like she wasn't getting enough air at times.  When she does get in the bed and wear her CPAP the entire night, she feels better the next day. Doesn't wake up with a headache. Wakes feeling more refreshed. Denies any sleep parasomnias/paralysis. She uses caution when she drives and will pull over if she becomes sleepy. 05/24/2023-06/22/2023: CPAP 5-15 cmH2O 18/30 days; 13% >4 hr; average use 2 hr 49 min Pressure 95th 10.2 Leaks 95th 8.4 AHI 3.5  08/07/2023: Today - follow up Patient presents today for follow up. She has still been struggling with poor sleep habits and not using her CPAP enough because of this. She and her  husband have been watching a new show and have been staying up until around 3 am then she wakes up around 7 am for the day. She has been doing better about putting her CPAP on when she goes to bed but it's been making a lot of noise due to leaks and so she ends up taking it off part way through the night. She is wearing a full face mask still. When she has her CPAP on most of the night, she does feel better rested. She denies drowsy driving or morning headaches. No sleep parasomnias/paralysis.  She did have viral symptoms last week. She still has some postnasal drainage with throat clearing. Feels like her sinuses are full in the morning but improves later. Clear drainage. Has a cough in the morning, usually from the drainage. Slowly feeling better. No fevers, chills, hemoptysis, facial tenderness.   07/08/2023-08/06/2023: CPAP 8-20 cmH2O 24/30 days; 13% >4 hr; average use 2 hr 26 min Pressure 95th 13.9 Leaks 95th 9.9  AHI 1.6  Allergies  Allergen Reactions   Lisinopril Swelling   Thimerosal (Thiomersal) Itching    Immunization History  Administered Date(s) Administered   Hep B, Unspecified 06/29/1977, 06/11/1982   Influenza Split 09/11/2013   Influenza Whole 12/13/2007   Influenza,inj,Quad PF,6+ Mos 08/29/2017, 08/30/2018, 10/11/2021, 10/11/2022   PPD Test 07/23/2014   Td 12/12/1998, 07/06/2009   Tdap 07/31/2019   Unspecified SARS-COV-2 Vaccination 12/30/2019, 01/17/2020    Past Medical History:  Diagnosis Date   Allergy    Arthritis    knees   Cataract    forming both eyes   Claustrophobia    essential hypertension    Lump or mass in breast    Obesity, unspecified    Other disorder of menstruation and other abnormal bleeding from female genital tract    Other speech disturbance(784.59)    Urinary incontinence    Urticaria, unspecified     Tobacco History: Social History   Tobacco Use  Smoking Status Never  Smokeless Tobacco Never   Counseling given: Not  Answered   Outpatient Medications Prior to Visit  Medication Sig Dispense Refill   acetaminophen (TYLENOL) 500 MG tablet Take 1,000 mg by mouth every 6 (six) hours as needed for moderate pain.     albuterol (VENTOLIN HFA) 108 (90 Base) MCG/ACT inhaler INHALE 2 PUFFS BY MOUTH EVERY 6 HOURS AS NEEDED 8.5 each 1   amLODipine (NORVASC) 5 MG tablet TAKE 1 TABLET (5 MG TOTAL) BY MOUTH DAILY. 90 tablet 0   Cholecalciferol (VITAMIN D3) 2000 units TABS Take 2,000 Units by mouth every other day.      escitalopram (LEXAPRO) 20 MG tablet TAKE 1 TABLET BY MOUTH EVERY DAY 90 tablet 1   fluticasone (FLONASE) 50 MCG/ACT nasal spray INSTILL 1 SPRAY INTO BOTH NOSTRILS DAILY (Patient taking differently: as needed.) 24 mL 1   hydrochlorothiazide (HYDRODIURIL) 12.5 MG tablet  Take 1 tablet (12.5 mg total) by mouth daily. 90 tablet 3   ibuprofen (ADVIL,MOTRIN) 200 MG tablet Take 200-400 mg by mouth every 6 (six) hours as needed for moderate pain.     levonorgestrel (MIRENA, 52 MG,) 20 MCG/DAY IUD      methocarbamol (ROBAXIN-750) 750 MG tablet Take 1 tablet (750 mg total) by mouth every 8 (eight) hours as needed for muscle spasms. 20 tablet 0   Multiple Vitamin (MULTIVITAMIN) tablet Take 1 tablet by mouth daily.     nystatin-triamcinolone (MYCOLOG II) cream APP EXT AA BID FOR 7 TO 14 DAYS     oxybutynin (DITROPAN-XL) 5 MG 24 hr tablet Take 1 tablet by mouth as needed.     Polyvinyl Alcohol-Povidone (REFRESH OP) Apply to eye.     triamcinolone ointment (KENALOG) 0.1 % APPLY THIN LAYER TOPICALLY TO THE AFFECTED AREA TWICE DAILY FOR 7 DAYS AS NEEDED     AMBULATORY NON FORMULARY MEDICATION Medication Name: Diltiazem 2 % gel - Using your index finger, apply a small amount of medication inside the rectum up to your first knuckle/joint three times daily x 6-8 weeks. 30 g 0   No facility-administered medications prior to visit.     Review of Systems:   Constitutional: No weight loss or gain, night sweats, fevers,  chills, or lassitude. +excessive daytime fatigue (improves with CPAP) HEENT: No difficulty swallowing, tooth/dental problems. No sneezing, itching. +AM dry mouth, headaches (baseline), nasal congestion, post nasal drip CV:  No chest pain, orthopnea, PND, swelling in lower extremities, anasarca, dizziness, palpitations, syncope Resp: +snoring (resolves with CPAP), witnessed apneas, shortness of breath with exertion (baseline), AM cough. No excess mucus or change in color of mucus. No hemoptysis. No wheezing.  No chest wall deformity GI:  No heartburn, indigestion GU: No dysuria, change in color of urine, urgency or frequency.   Skin: No rash, lesions, ulcerations MSK:  No joint pain or swelling.   Neuro: No dizziness or lightheadedness.  Psych: No depression or anxiety. Mood stable.     Physical Exam:  BP 120/70 (BP Location: Left Arm, Cuff Size: Large)   Pulse (!) 56   Ht 5\' 7"  (1.702 m)   Wt 221 lb 12.8 oz (100.6 kg)   LMP 10/27/2019 (Exact Date)   SpO2 97%   BMI 34.74 kg/m   GEN: Pleasant, interactive, well-appearing; obese; in no acute distress. HEENT:  Normocephalic and atraumatic. PERRLA. Sclera white. Nasal turbinates pink, moist and patent bilaterally. Clear rhinorrhea present. Oropharynx pink and moist, without exudate or edema. No lesions, ulcerations, or postnasal drip. Mallampati II NECK:  Supple w/ fair ROM. No JVD present. Normal carotid impulses w/o bruits. Thyroid symmetrical with no goiter or nodules palpated. No lymphadenopathy.   CV: RRR, no m/r/g, no peripheral edema. Pulses intact, +2 bilaterally. No cyanosis, pallor or clubbing. PULMONARY:  Unlabored, regular breathing. Clear bilaterally A&P w/o wheezes/rales/rhonchi. No accessory muscle use.  GI: BS present and normoactive. Soft, non-tender to palpation. No organomegaly or masses detected.  MSK: No erythema, warmth or tenderness. Cap refil <2 sec all extrem. No deformities or joint swelling noted.  Neuro: A/Ox3.  No focal deficits noted.   Skin: Warm, no lesions or rashe Psych: Normal affect and behavior. Judgement and thought content appropriate.     Lab Results:  CBC    Component Value Date/Time   WBC 7.0 12/08/2022 0812   RBC 4.84 12/08/2022 0812   HGB 13.2 12/08/2022 0812   HCT 40.8 12/08/2022 0812   PLT  245.0 12/08/2022 0812   MCV 84.4 12/08/2022 0812   MCH 26.6 10/05/2018 2357   MCHC 32.5 12/08/2022 0812   RDW 14.6 12/08/2022 0812   LYMPHSABS 2.3 12/08/2022 0812   MONOABS 0.6 12/08/2022 0812   EOSABS 0.2 12/08/2022 0812   BASOSABS 0.0 12/08/2022 0812    BMET    Component Value Date/Time   NA 142 12/08/2022 0812   K 4.0 12/08/2022 0812   CL 106 12/08/2022 0812   CO2 30 12/08/2022 0812   GLUCOSE 98 12/08/2022 0812   BUN 19 12/08/2022 0812   CREATININE 0.97 12/08/2022 0812   CALCIUM 10.8 (H) 12/08/2022 0812   GFRNONAA 52 (L) 10/05/2018 2357   GFRAA 60 (L) 10/05/2018 2357    BNP No results found for: "BNP"   Imaging:  MM 3D DIAGNOSTIC MAMMOGRAM UNILATERAL LEFT BREAST  Result Date: 07/14/2023 CLINICAL DATA:  Patient was recalled from screening mammogram for a possible mass in the left breast. EXAM: DIGITAL DIAGNOSTIC UNILATERAL LEFT MAMMOGRAM WITH TOMOSYNTHESIS AND CAD; ULTRASOUND LEFT BREAST LIMITED TECHNIQUE: Left digital diagnostic mammography and breast tomosynthesis was performed. The images were evaluated with computer-aided detection. ; Targeted ultrasound examination of the left breast was performed. COMPARISON:  Previous exam(s). ACR Breast Density Category b: There are scattered areas of fibroglandular density. FINDINGS: Additional imaging of the left breast was performed. There is persistence of a mass in the 12 o'clock region of the breast. There are no malignant type microcalcifications. Targeted ultrasound is performed, showing benign-appearing septated cyst in the left breast at 12 o'clock 9 cm from the nipple measuring 1.6 x 0.5 x 0.9 cm. IMPRESSION: Left  breast cyst.  No evidence of malignancy in the left breast. RECOMMENDATION: Bilateral screening mammogram in July of 2025 is recommended. I have discussed the findings and recommendations with the patient. If applicable, a reminder letter will be sent to the patient regarding the next appointment. BI-RADS CATEGORY  2: Benign. Electronically Signed   By: Baird Lyons M.D.   On: 07/14/2023 12:03   [place] LIMITED ULTRASOUND INCLUDING AXILLA LEFT BREAST   Result Date: 07/14/2023 CLINICAL DATA:  Patient was recalled from screening mammogram for a possible mass in the left breast. EXAM: DIGITAL DIAGNOSTIC UNILATERAL LEFT MAMMOGRAM WITH TOMOSYNTHESIS AND CAD; ULTRASOUND LEFT BREAST LIMITED TECHNIQUE: Left digital diagnostic mammography and breast tomosynthesis was performed. The images were evaluated with computer-aided detection. ; Targeted ultrasound examination of the left breast was performed. COMPARISON:  Previous exam(s). ACR Breast Density Category b: There are scattered areas of fibroglandular density. FINDINGS: Additional imaging of the left breast was performed. There is persistence of a mass in the 12 o'clock region of the breast. There are no malignant type microcalcifications. Targeted ultrasound is performed, showing benign-appearing septated cyst in the left breast at 12 o'clock 9 cm from the nipple measuring 1.6 x 0.5 x 0.9 cm. IMPRESSION: Left breast cyst.  No evidence of malignancy in the left breast. RECOMMENDATION: Bilateral screening mammogram in July of 2025 is recommended. I have discussed the findings and recommendations with the patient. If applicable, a reminder letter will be sent to the patient regarding the next appointment. BI-RADS CATEGORY  2: Benign. Electronically Signed   By: Baird Lyons M.D.   On: 07/14/2023 12:03    lidocaine (XYLOCAINE) 1 % (with pres) injection 3 mL     Date Action Dose Route User   07/03/2023 1309 Given 3 mL Other Richardean Canal W, PA-C      methylPREDNISolone  acetate (DEPO-MEDROL) injection  40 mg     Date Action Dose Route User   07/03/2023 1309 Given 40 mg Intra-articular Kirtland Bouchard, PA-C           No data to display          No results found for: "NITRICOXIDE"      Assessment & Plan:   Moderate obstructive sleep apnea Moderate OSA on CPAP. Suboptimal compliance. Again discussed measures to increase usage. Tighten settings to 10-16 cmH2O. Will refer her for mask desensitization study. Reviewed risks of untreated moderate OSA. She does receive benefit from use of CPAP so she would like to continue with it. Aware of safe driving practices. Sleep hygiene reviewed.   Patient Instructions  Increase CPAP usage to every night, minimum of 4-6 hours a night.  Change equipment every 30 days or as directed by DME. Wash your tubing with warm soap and water daily, hang to dry. Wash humidifier portion weekly. Use bottled, distilled water and change daily  Be aware of reduced alertness and do not drive or operate heavy machinery if experiencing this or drowsiness.  Exercise encouraged, as tolerated. Healthy weight management discussed.  Notify if persistent daytime sleepiness occurs even with consistent use of CPAP.   We discussed how untreated sleep apnea puts an individual at risk for cardiac arrhthymias, pulm HTN, DM, stroke and increases their risk for daytime accidents.   I will adjust your CPAP settings to 10-16 cmH2O   Mask fitting ordered   Use saline nasal rinse 1-2 times a day then follow with flonase nasal spray 2 sprays each nostril in AM about 20-30 minutes after your saline rinse You can use saline nasal gel at bedtime before putting your CPAP on    Follow up in 6 weeks with Dr. Wynona Neat or Katie Leota Maka,NP. If symptoms do not improve or worsen, please contact office for sooner follow up    Obesity (BMI 30-39.9) BMI 34. Healthy weight loss encouraged.   URI (upper respiratory infection) Clinically improving with residual  postnasal drainage and mild cough. Supportive care measures advised. She will call if symptoms worsen or do not continue to improve    I spent 35 minutes of dedicated to the care of this patient on the date of this encounter to include pre-visit review of records, face-to-face time with the patient discussing conditions above, post visit ordering of testing, clinical documentation with the electronic health record, making appropriate referrals as documented, and communicating necessary findings to members of the patients care team.  Noemi Chapel, NP 08/07/2023  Pt aware and understands NP's role.

## 2023-08-28 ENCOUNTER — Ambulatory Visit (INDEPENDENT_AMBULATORY_CARE_PROVIDER_SITE_OTHER): Payer: BC Managed Care – PPO | Admitting: Sports Medicine

## 2023-08-28 ENCOUNTER — Ambulatory Visit: Payer: BC Managed Care – PPO | Admitting: Physician Assistant

## 2023-08-28 ENCOUNTER — Other Ambulatory Visit: Payer: Self-pay

## 2023-08-28 DIAGNOSIS — M25551 Pain in right hip: Secondary | ICD-10-CM | POA: Diagnosis not present

## 2023-08-28 DIAGNOSIS — M7061 Trochanteric bursitis, right hip: Secondary | ICD-10-CM

## 2023-08-28 DIAGNOSIS — M1611 Unilateral primary osteoarthritis, right hip: Secondary | ICD-10-CM

## 2023-08-28 MED ORDER — METHYLPREDNISOLONE ACETATE 40 MG/ML IJ SUSP
80.0000 mg | INTRAMUSCULAR | Status: AC | PRN
Start: 2023-08-28 — End: 2023-08-28
  Administered 2023-08-28: 80 mg via INTRA_ARTICULAR

## 2023-08-28 MED ORDER — LIDOCAINE HCL 1 % IJ SOLN
4.0000 mL | INTRAMUSCULAR | Status: AC | PRN
Start: 2023-08-28 — End: 2023-08-28
  Administered 2023-08-28: 4 mL

## 2023-08-28 NOTE — Progress Notes (Signed)
HPI: Karen Pope returns today for right hip pain.  She states that the trochanteric injection gave her good relief for about a week and a half.  Pain does awaken her at night.  No numbness tingling down the leg but does have some numbness in the right fifth toe.  No real groin pain but does have right buttocks pain.  Right hip MRI showed moderate generative changes but no evidence of fracture or AVN.  Right hip.  Trochanteric tendinosis with no bursitis. States that her pain is 10 out of 10 pain at worst.  Most the time 2-3 out of 10 pain.  She has difficulty sleeping at night due to hip pain.  Tylenol does ease her pain some.  Review of systems: See HPI otherwise negative  Physical exam: General: Well-developed well-nourished female who is able to get on and off the exam table on her own.  Uses no assistive device to ambulate.  Nonantalgic gait.  Bilateral hips: Smooth and fluid range of motion both hips without pain.  Right hip tenderness over the trochanteric region.  Impression: Right hip arthritis Right hip trochanteric bursitis  Plan: Recommend intra articular injection right hip.  Follow-up with Dr. Raye Sorrow 2 weeks after the injection to see how she is doing what type of response she had.  Questions were encouraged and answered at length.

## 2023-08-28 NOTE — Progress Notes (Signed)
Procedure Note  Patient: Karen Pope             Date of Birth: September 16, 1961           MRN: [id number]             Visit Date: 08/28/2023  Procedures: Visit Diagnoses:  1. Pain in right hip    Large Joint Inj: R hip joint on 08/28/2023 2:39 PM Indications: pain Details: 22 G 3.5 in needle, ultrasound-guided anterior approach Medications: 4 mL lidocaine 1 %; 80 mg methylPREDNISolone acetate 40 MG/ML Outcome: tolerated well, no immediate complications  Procedure: US-guided intra-articular hip injection, right  After discussion on risks/benefits/indications and informed verbal consent was obtained, a timeout was performed. Patient was lying supine on exam table. The hip was cleaned with betadine and alcohol swabs. Then utilizing ultrasound guidance, the patient's femoral head and neck junction was identified and subsequently injected with 4:2 lidocaine:depomedrol via an in-plane approach with ultrasound visualization of the injectate administered into the hip joint. Patient tolerated procedure well without immediate complications.  Procedure, treatment alternatives, risks and benefits explained, specific risks discussed. Consent was given by the patient. Immediately prior to procedure a time out was called to verify the correct patient, procedure, equipment, support staff and site/side marked as required. Patient was prepped and draped in the usual sterile fashion.    - I evaluated the patient about 5 minutes post-injection and she was doing well without AE's - follow-up with Dr. Magnus Ivan as indicated; I am happy to see them as needed  Madelyn Brunner, DO Primary Care Sports Medicine Physician  [facility] - [facility]oped[facility]  This note was dictated using Dragon naturally speaking software and may contain errors in syntax, spelling, or content which have not been identified prior to signing this note.

## 2023-09-07 ENCOUNTER — Ambulatory Visit: Payer: BC Managed Care – PPO | Admitting: Adult Health

## 2023-09-07 ENCOUNTER — Ambulatory Visit: Payer: BC Managed Care – PPO

## 2023-09-08 ENCOUNTER — Telehealth (INDEPENDENT_AMBULATORY_CARE_PROVIDER_SITE_OTHER): Payer: BC Managed Care – PPO | Admitting: Adult Health

## 2023-09-08 DIAGNOSIS — F419 Anxiety disorder, unspecified: Secondary | ICD-10-CM | POA: Diagnosis not present

## 2023-09-08 MED ORDER — BUPROPION HCL ER (XL) 150 MG PO TB24
150.0000 mg | ORAL_TABLET | Freq: Every day | ORAL | 0 refills | Status: DC
Start: 2023-09-08 — End: 2023-11-15

## 2023-09-08 NOTE — Progress Notes (Signed)
Virtual Visit via Video Note  I connected with Karen Pope  on 09/08/23 at  4:00 PM EDT by a video enabled telemedicine application and verified that I am speaking with the correct person using two identifiers.  Location patient: home Location provider:work or home office Persons participating in the virtual visit: patient, provider  I discussed the limitations of evaluation and management by telemedicine and the availability of in person appointments. The patient expressed understanding and agreed to proceed.   HPI:  62 year old female who is being evaluated today for chronic issue.  She has known history of anxiety and takes Lexapro 20 mg daily for this.  She has been well-controlled in the past.  As of recently she is reports having breakthrough anxiety.  2 most recent examples he can give me as when she is riding with her husband and he goes through a car wash she has to get out of the car because she will develop a panic attack if she is in the car going to the carwash with him.  If she is alone she can go to the carwash without any issues.  She also reports sitting at the kitchen table having a cup of coffee and started to have a panic attack.  She was able to calm herself down to her deep breathing.  She has found that darkness and not being able to see outside causes anxiety for her.  She feels as though some of her increased anxiety may be coming from stress related to having quite a few family members either passing or in poor health.  She is going on a cruise in January and reports that when she went on a cruise in June she did have a few panic attacks and she wanted to get off the boat.  ROS: See pertinent positives and negatives per HPI.  Past Medical History:  Diagnosis Date   Allergy    Arthritis    knees   Cataract    forming both eyes   Claustrophobia    essential hypertension    Lump or mass in breast    Obesity, unspecified    Other disorder of menstruation and  other abnormal bleeding from female genital tract    Other speech disturbance(784.59)    Urinary incontinence    Urticaria, unspecified     Past Surgical History:  Procedure Laterality Date   BREAST BIOPSY     benign    CHOLECYSTECTOMY N/A 10/07/2018   Procedure: LAPAROSCOPIC CHOLECYSTECTOMY WITH INTRAOPERATIVE CHOLANGIOGRAM;  Surgeon: Darnell Level, MD;  Location: [facility];  Service: General;  Laterality: N/A;   COLONOSCOPY     hysteroscopy  12/02/2022   TONSILLECTOMY  1979   TUBAL LIGATION  1997   WISDOM TOOTH EXTRACTION     1980's    Family History  Problem Relation Age of Onset   Breast cancer Mother 32   Leukemia Maternal Aunt        leukemia- type cancer   Breast cancer Maternal Aunt 87   Prostate cancer Maternal Uncle    Cancer Paternal Aunt        either uterine or cervical cancer   Pancreatic cancer Cousin        37    Prostate cancer Half-Brother    Colon cancer Other        in his 66's   Pancreatic cancer Other        in his 5's   Colon polyps Neg Hx    Esophageal cancer  Neg Hx    Rectal cancer Neg Hx    Stomach cancer Neg Hx        Current Outpatient Medications:    acetaminophen (TYLENOL) 500 MG tablet, Take 1,000 mg by mouth every 6 (six) hours as needed for moderate pain., Disp: , Rfl:    albuterol (VENTOLIN HFA) 108 (90 Base) MCG/ACT inhaler, INHALE 2 PUFFS BY MOUTH EVERY 6 HOURS AS NEEDED, Disp: 8.5 each, Rfl: 1   amLODipine (NORVASC) 5 MG tablet, TAKE 1 TABLET (5 MG TOTAL) BY MOUTH DAILY., Disp: 90 tablet, Rfl: 0   Cholecalciferol (VITAMIN D3) 2000 units TABS, Take 2,000 Units by mouth every other day. , Disp: , Rfl:    escitalopram (LEXAPRO) 20 MG tablet, TAKE 1 TABLET BY MOUTH EVERY DAY, Disp: 90 tablet, Rfl: 1   fluticasone (FLONASE) 50 MCG/ACT nasal spray, INSTILL 1 SPRAY INTO BOTH NOSTRILS DAILY (Patient taking differently: as needed.), Disp: 24 mL, Rfl: 1   hydrochlorothiazide (HYDRODIURIL) 12.5 MG tablet, Take 1 tablet (12.5 mg total) by  mouth daily., Disp: 90 tablet, Rfl: 3   ibuprofen (ADVIL,MOTRIN) 200 MG tablet, Take 200-400 mg by mouth every 6 (six) hours as needed for moderate pain., Disp: , Rfl:    levonorgestrel (MIRENA, 52 MG,) 20 MCG/DAY IUD, , Disp: , Rfl:    methocarbamol (ROBAXIN-750) 750 MG tablet, Take 1 tablet (750 mg total) by mouth every 8 (eight) hours as needed for muscle spasms., Disp: 20 tablet, Rfl: 0   Multiple Vitamin (MULTIVITAMIN) tablet, Take 1 tablet by mouth daily., Disp: , Rfl:    nystatin-triamcinolone (MYCOLOG II) cream, APP EXT AA BID FOR 7 TO 14 DAYS, Disp: , Rfl:    oxybutynin (DITROPAN-XL) 5 MG 24 hr tablet, Take 1 tablet by mouth as needed., Disp: , Rfl:    Polyvinyl Alcohol-Povidone (REFRESH OP), Apply to eye., Disp: , Rfl:    triamcinolone ointment (KENALOG) 0.1 %, APPLY THIN LAYER TOPICALLY TO THE AFFECTED AREA TWICE DAILY FOR 7 DAYS AS NEEDED, Disp: , Rfl:   EXAM:  VITALS per patient if applicable:  GENERAL: alert, oriented, appears well and in no acute distress  HEENT: atraumatic, conjunttiva clear, no obvious abnormalities on inspection of external nose and ears  NECK: normal movements of the head and neck  LUNGS: on inspection no signs of respiratory distress, breathing rate appears normal, no obvious gross SOB, gasping or wheezing  CV: no obvious cyanosis  MS: moves all visible extremities without noticeable abnormality  PSYCH/NEURO: pleasant and cooperative, no obvious depression or anxiety, speech and thought processing grossly intact  ASSESSMENT AND PLAN:  Discussed the following assessment and plan:  1. Anxiety - I am going to add Wellbutrin to her regimen. Continue with Lexapro 20 mg daily - Follow up in 30 days  - buPROPion (WELLBUTRIN XL) 150 MG 24 hr tablet; Take 1 tablet (150 mg total) by mouth daily.  Dispense: 90 tablet; Refill: 0      I discussed the assessment and treatment plan with the patient. The patient was provided an opportunity to ask questions  and all were answered. The patient agreed with the plan and demonstrated an understanding of the instructions.   The patient was advised to call back or seek an in-person evaluation if the symptoms worsen or if the condition fails to improve as anticipated.   Shirline Frees, NP

## 2023-09-13 ENCOUNTER — Encounter: Payer: Self-pay | Admitting: Orthopaedic Surgery

## 2023-09-13 ENCOUNTER — Ambulatory Visit: Payer: BC Managed Care – PPO | Admitting: Orthopaedic Surgery

## 2023-09-13 DIAGNOSIS — M1611 Unilateral primary osteoarthritis, right hip: Secondary | ICD-10-CM

## 2023-09-13 DIAGNOSIS — M25551 Pain in right hip: Secondary | ICD-10-CM | POA: Diagnosis not present

## 2023-09-13 NOTE — Progress Notes (Signed)
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  She comes in for continued follow-up as a relates to an arthritic right hip.  She has tried a trochanteric injection as well as an intra-articular steroid injection and both of these have helped but only temporarily.  She is asymptomatic with her left hip.  Her right hip is more problematic in terms of crossing her leg and putting shoes and socks on.  It is beginning to detrimentally affect her mobility, her quality of life and her actives daily living.  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  She is someone who is also at 2 taking care of her husband who has more debilitating arthritis in his hips and it is quite an issue at this standpoint for him from a mobility standpoint and she does have to help him out quite a bit.  On exam her left hip no smoothly and fluidly.  The right hip has no blocks to rotation but it is a little bit more stiff but has a lot of pain in the groin and around the trochanteric area of the right hip.  Her recent MRI did showed bilateral trochanteric tendinosis around both hips but the right hip definitely has moderate arthritis with superior lateral labral tearing as well as degenerative changes of the cartilage and cystic changes as well.  We had a long and thorough discussion about considering hip replacement surgery at some point.  She has a handout about hip replacement surgery and we discussed what to expect from an intraoperative and postoperative standpoint as well as the risk and benefits of surgery.  This is something she is going to consider but I have told her that I would be happy to see her husband to see what his hips look like because she did show me a photograph of the x-rays of both hips which is severe bone-on-bone wear.  He does weigh around 355 pounds she states.  I would definitely need to see him in the office to take a look and she understands this as well.

## 2023-09-15 NOTE — Telephone Encounter (Signed)
Sleep referral below will not call her back despite all her messages on their VM. Can we use someone else? Her # is [phone]

## 2023-09-18 ENCOUNTER — Ambulatory Visit: Payer: BC Managed Care – PPO | Admitting: Nurse Practitioner

## 2023-09-19 NOTE — Telephone Encounter (Signed)
Patient was scheduled for mask fit by Terri P for 10/16/23

## 2023-09-26 ENCOUNTER — Other Ambulatory Visit: Payer: Self-pay | Admitting: Adult Health

## 2023-09-26 DIAGNOSIS — I10 Essential (primary) hypertension: Secondary | ICD-10-CM

## 2023-09-27 DIAGNOSIS — G4733 Obstructive sleep apnea (adult) (pediatric): Secondary | ICD-10-CM

## 2023-09-28 NOTE — Telephone Encounter (Signed)
Order placed

## 2023-09-29 NOTE — Addendum Note (Signed)
Addended by: Noemi Chapel on: 09/29/2023 09:44 AM   Modules accepted: Orders

## 2023-10-16 ENCOUNTER — Ambulatory Visit (HOSPITAL_BASED_OUTPATIENT_CLINIC_OR_DEPARTMENT_OTHER): Payer: BC Managed Care – PPO | Attending: Nurse Practitioner | Admitting: Radiology

## 2023-10-16 DIAGNOSIS — G4733 Obstructive sleep apnea (adult) (pediatric): Secondary | ICD-10-CM

## 2023-11-08 ENCOUNTER — Ambulatory Visit: Payer: BC Managed Care – PPO | Admitting: Nurse Practitioner

## 2023-11-15 ENCOUNTER — Encounter: Payer: Self-pay | Admitting: Adult Health

## 2023-11-15 ENCOUNTER — Telehealth (INDEPENDENT_AMBULATORY_CARE_PROVIDER_SITE_OTHER): Payer: BC Managed Care – PPO | Admitting: Adult Health

## 2023-11-15 VITALS — Ht 67.0 in | Wt 219.0 lb

## 2023-11-15 DIAGNOSIS — F419 Anxiety disorder, unspecified: Secondary | ICD-10-CM

## 2023-11-15 DIAGNOSIS — R229 Localized swelling, mass and lump, unspecified: Secondary | ICD-10-CM | POA: Diagnosis not present

## 2023-11-15 DIAGNOSIS — M7989 Other specified soft tissue disorders: Secondary | ICD-10-CM

## 2023-11-15 MED ORDER — BUPROPION HCL ER (XL) 150 MG PO TB24: 150.0000 mg | ORAL_TABLET | Freq: Every day | ORAL | 1 refills | Status: DC

## 2023-11-15 MED ORDER — ALPRAZOLAM 0.25 MG PO TABS
0.2500 mg | ORAL_TABLET | Freq: Every evening | ORAL | 0 refills | Status: AC | PRN
Start: 2023-11-15 — End: ?

## 2023-11-15 NOTE — Progress Notes (Signed)
Virtual Visit via Video Note  I connected with Karen Pope  on 11/15/23 at  8:00 AM EST by a video enabled telemedicine application and verified that I am speaking with the correct person using two identifiers.  Location patient: home Location provider:work or home office Persons participating in the virtual visit: patient, provider  I discussed the limitations of evaluation and management by telemedicine and the availability of in person appointments. The patient expressed understanding and agreed to proceed.   HPI:  She is being evaluated today for follow-up regarding anxiety.  She was last seen roughly 3 months ago time she was taking Lexapro 20 mg daily for anxiety.  In the past she had been well-controlled but back in September she was having breakthrough anxiety and panic attacks the preceding few months.  We decided to keep her on Lexapro 20 mg daily and added Wellbutrin 150 mg extended release.  Today she reports that since starting Wellbutrin she has not experienced any side effects. She has not had any panic attacks or episodes of intense anxiety. She feels more calm and focused. She would like to continues with Wellbutrin and Lexapro   She is going on a cruise next month and would like to have a refill of Xanax 0.25 mg to have on hand.   Additionally, for the last 11 months she has had a tender, movable, enlarged suspected lymph node at her right jaw. She did have a tooth extracted but this did not make any difference in the size or tenderness.   ROS: See pertinent positives and negatives per HPI.  Past Medical History:  Diagnosis Date   Allergy    Arthritis    knees   Cataract    forming both eyes   Claustrophobia    essential hypertension    Lump or mass in breast    Obesity, unspecified    Other disorder of menstruation and other abnormal bleeding from female genital tract    Other speech disturbance(784.59)    Urinary incontinence    Urticaria, unspecified      Past Surgical History:  Procedure Laterality Date   BREAST BIOPSY     benign    CHOLECYSTECTOMY N/A 10/07/2018   Procedure: LAPAROSCOPIC CHOLECYSTECTOMY WITH INTRAOPERATIVE CHOLANGIOGRAM;  Surgeon: Darnell Level, MD;  Location: [facility];  Service: General;  Laterality: N/A;   COLONOSCOPY     hysteroscopy  12/02/2022   TONSILLECTOMY  1979   TUBAL LIGATION  1997   WISDOM TOOTH EXTRACTION     1980's    Family History  Problem Relation Age of Onset   Breast cancer Mother 43   Leukemia Maternal Aunt        leukemia- type cancer   Breast cancer Maternal Aunt 87   Prostate cancer Maternal Uncle    Cancer Paternal Aunt        either uterine or cervical cancer   Pancreatic cancer Cousin        63    Prostate cancer Half-Brother    Colon cancer Other        in his 26's   Pancreatic cancer Other        in his 46's   Colon polyps Neg Hx    Esophageal cancer Neg Hx    Rectal cancer Neg Hx    Stomach cancer Neg Hx        Current Outpatient Medications:    acetaminophen (TYLENOL) 500 MG tablet, Take 1,000 mg by mouth every 6 (six) hours as  needed for moderate pain., Disp: , Rfl:    albuterol (VENTOLIN HFA) 108 (90 Base) MCG/ACT inhaler, INHALE 2 PUFFS BY MOUTH EVERY 6 HOURS AS NEEDED, Disp: 8.5 each, Rfl: 1   amLODipine (NORVASC) 5 MG tablet, TAKE 1 TABLET (5 MG TOTAL) BY MOUTH DAILY., Disp: 90 tablet, Rfl: 0   buPROPion (WELLBUTRIN XL) 150 MG 24 hr tablet, Take 1 tablet (150 mg total) by mouth daily., Disp: 90 tablet, Rfl: 0   Cholecalciferol (VITAMIN D3) 2000 units TABS, Take 2,000 Units by mouth every other day. , Disp: , Rfl:    escitalopram (LEXAPRO) 20 MG tablet, TAKE 1 TABLET BY MOUTH EVERY DAY, Disp: 90 tablet, Rfl: 1   fluticasone (FLONASE) 50 MCG/ACT nasal spray, INSTILL 1 SPRAY INTO BOTH NOSTRILS DAILY (Patient taking differently: as needed.), Disp: 24 mL, Rfl: 1   hydrochlorothiazide (HYDRODIURIL) 12.5 MG tablet, Take 1 tablet (12.5 mg total) by mouth daily., Disp: 90  tablet, Rfl: 3   ibuprofen (ADVIL,MOTRIN) 200 MG tablet, Take 200-400 mg by mouth every 6 (six) hours as needed for moderate pain., Disp: , Rfl:    levonorgestrel (MIRENA, 52 MG,) 20 MCG/DAY IUD, , Disp: , Rfl:    methocarbamol (ROBAXIN-750) 750 MG tablet, Take 1 tablet (750 mg total) by mouth every 8 (eight) hours as needed for muscle spasms., Disp: 20 tablet, Rfl: 0   Multiple Vitamin (MULTIVITAMIN) tablet, Take 1 tablet by mouth daily., Disp: , Rfl:    nystatin-triamcinolone (MYCOLOG II) cream, APP EXT AA BID FOR 7 TO 14 DAYS, Disp: , Rfl:    oxybutynin (DITROPAN-XL) 5 MG 24 hr tablet, Take 1 tablet by mouth as needed., Disp: , Rfl:    Polyvinyl Alcohol-Povidone (REFRESH OP), Apply to eye., Disp: , Rfl:    triamcinolone ointment (KENALOG) 0.1 %, APPLY THIN LAYER TOPICALLY TO THE AFFECTED AREA TWICE DAILY FOR 7 DAYS AS NEEDED, Disp: , Rfl:   EXAM:  VITALS per patient if applicable:  GENERAL: alert, oriented, appears well and in no acute distress  HEENT: atraumatic, conjunttiva clear, no obvious abnormalities on inspection of external nose and ears  NECK: normal movements of the head and neck  LUNGS: on inspection no signs of respiratory distress, breathing rate appears normal, no obvious gross SOB, gasping or wheezing  CV: no obvious cyanosis  MS: moves all visible extremities without noticeable abnormality  SKIN: Notable enlarged mass in the preauricular lymph node area   PSYCH/NEURO: pleasant and cooperative, no obvious depression or anxiety, speech and thought processing grossly intact  ASSESSMENT AND PLAN:  Discussed the following assessment and plan:  1. Anxiety - Resolved. Continue with current regimen  - buPROPion (WELLBUTRIN XL) 150 MG 24 hr tablet; Take 1 tablet (150 mg total) by mouth daily.  Dispense: 90 tablet; Refill: 1 - ALPRAZolam (XANAX) 0.25 MG tablet; Take 1 tablet (0.25 mg total) by mouth at bedtime as needed for anxiety.  Dispense: 30 tablet; Refill: 0  2.  Soft tissue swelling - Will order [place] of soft tissue due to time frame and symptoms  - US Soft Tissue Head/Neck (NON-THYROID); Future      I discussed the assessment and treatment plan with the patient. The patient was provided an opportunity to ask questions and all were answered. The patient agreed with the plan and demonstrated an understanding of the instructions.   The patient was advised to call back or seek an in-person evaluation if the symptoms worsen or if the condition fails to improve as anticipated.  Shirline Frees, NP

## 2023-11-27 ENCOUNTER — Ambulatory Visit
Admission: RE | Admit: 2023-11-27 | Discharge: 2023-11-27 | Disposition: A | Discharge status: Home / Self Care | Payer: BC Managed Care – PPO | Source: Ambulatory Visit | Attending: Adult Health

## 2023-11-27 DIAGNOSIS — R221 Localized swelling, mass and lump, neck: Secondary | ICD-10-CM | POA: Diagnosis not present

## 2023-11-27 DIAGNOSIS — R229 Localized swelling, mass and lump, unspecified: Secondary | ICD-10-CM

## 2023-11-29 NOTE — Telephone Encounter (Signed)
Updated patient on [place] results which showed fairly well-defined macrolobulated 0.9 cm hypoechoic nodule.  She would like to proceed with CT scan

## 2023-12-01 NOTE — Telephone Encounter (Signed)
FYI

## 2023-12-04 ENCOUNTER — Encounter: Payer: Self-pay | Admitting: Adult Health

## 2023-12-09 ENCOUNTER — Other Ambulatory Visit: Payer: Self-pay | Admitting: Adult Health

## 2023-12-09 DIAGNOSIS — F32A Anxiety disorder, unspecified: Secondary | ICD-10-CM

## 2023-12-09 DIAGNOSIS — I10 Essential (primary) hypertension: Secondary | ICD-10-CM

## 2023-12-11 ENCOUNTER — Other Ambulatory Visit: Payer: BC Managed Care – PPO

## 2023-12-15 ENCOUNTER — Ambulatory Visit
Admission: RE | Admit: 2023-12-15 | Discharge: 2023-12-15 | Disposition: A | Discharge status: Home / Self Care | Payer: BC Managed Care – PPO | Source: Ambulatory Visit | Attending: Adult Health

## 2023-12-15 DIAGNOSIS — R221 Localized swelling, mass and lump, neck: Secondary | ICD-10-CM | POA: Diagnosis not present

## 2023-12-15 DIAGNOSIS — M7989 Other specified soft tissue disorders: Secondary | ICD-10-CM

## 2023-12-15 MED ORDER — IOPAMIDOL (ISOVUE-300) INJECTION 61%
75.0000 mL | Freq: Once | INTRAVENOUS | Status: AC | PRN
  Administered 2023-12-15: 75 mL via INTRAVENOUS

## 2024-01-02 NOTE — Telephone Encounter (Signed)
Please advise 

## 2024-01-04 ENCOUNTER — Other Ambulatory Visit: Payer: Self-pay | Admitting: Adult Health

## 2024-01-04 DIAGNOSIS — I10 Essential (primary) hypertension: Secondary | ICD-10-CM

## 2024-01-08 ENCOUNTER — Telehealth (INDEPENDENT_AMBULATORY_CARE_PROVIDER_SITE_OTHER): Payer: BC Managed Care – PPO | Admitting: Nurse Practitioner

## 2024-01-08 DIAGNOSIS — G4733 Obstructive sleep apnea (adult) (pediatric): Secondary | ICD-10-CM

## 2024-01-08 NOTE — Progress Notes (Unsigned)
Patient ID: Karen Pope, female     DOB: [birth year], 63 y.o.      MRN: [id number]  Chief Complaint  Patient presents with   Follow-up    Pt state she has been using her Cpap most of the time     Virtual Visit via Video Note  I connected with Sharlyne Cai on 01/09/24 at  9:30 AM EST by a video enabled telemedicine application and verified that I am speaking with the correct person using two identifiers.  Location: Patient: Home Provider: Office    I discussed the limitations of evaluation and management by telemedicine and the availability of in person appointments. The patient expressed understanding and agreed to proceed.  History of Present Illness: 63 year old female, never smoker followed for moderate OSA. Past medical history significant for HTN, obesity.    TEST/EVENTS:  03/08/2023 HST: AHI 20.2/h, SpO2 low 79%   01/30/2023: OV with Amalya Salmons NP for sleep consult referred by Shirline Frees, NP. She had a gynecologic procedure recently and afterwards, she was advised by the anesthesiologist to seek further evaluation as they were concerned she may have sleep apnea. She had very loud snoring and changes in her breathing pattern. She has also been told by her husband that she snores loudly and has shallow breathing at night. She has slept talked in the past. She feels tired during the day and wakes feeling poorly rested. She wakes with morning headaches and a dry mouth in the morning. She has never fallen asleep while driving but she used to do home visits and when she would park somewhere to chart, she would doze off easily. She also tells me that she's having some trouble with jaw discomfort on the left side. Her ear was bothering her as well. They treated her with augmentin and doxycycline with some improvement. She still notices that her jaw is tight and painful at times. When she opens her mouth, she occasionally notices a popping sensation. She is going to be seeing her dentist soon.  She denies sleep walking, sleep paralysis. No history of narcolepsy or symptoms of cataplexy.  She goes to sleep between midnight and 1 am. Falls asleep quickly, within 10 min. Doesn't tend to wake through the night. Wakes around 730-8 am. 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 She is primarily working from home now. Her weight is down 10 lb over the past two years. She has never had a sleep study before.  She has a history of HTN, HLD, and allergies. No history of stroke. She is a never smoker. Rarely drinks alcohol. No sleep aids. 1-2 caffeinated  beverages a day. Lives with her husband. She has 3 children. Family history of heart disease and breast cancer [place]worth 15   04/10/2023: OV with Natoshia Souter NP for follow up to discuss sleep study results, which revealed moderate OSA. She continues to have trouble with restless sleep and feeling tired during the day. She's more tired now than she was before she started working from home. She is hesitant about CPAP therapy because she has some claustrophobia. Willing to try it.    06/26/2023: OV with Kameryn Davern NP for follow up after starting on CPAP. She has had some trouble adjusting to use. She also has poor sleep habits, including falling asleep on the couch and then she ends up not wearing her CPAP much that night. She does have some nights she barely  sleeps because of work. She otherwise doesn't feel like she has trouble staying asleep or falling asleep. She does notice that sometimes she takes her mask off part way through the night and will wake up without it on. This got better when she switched from a nasal pillow mask to a full face but then she started feeling like she wasn't getting enough air at times. When she does get in the bed and wear her CPAP the entire night, she feels better the next day. Doesn't wake up with a headache. Wakes feeling more refreshed. Denies any sleep parasomnias/paralysis. She uses caution  when she drives and will pull over if she becomes sleepy. 05/24/2023-06/22/2023: CPAP 5-15 cmH2O 18/30 days; 13% >4 hr; average use 2 hr 49 min Pressure 95th 10.2 Leaks 95th 8.4 AHI 3.5   08/07/2023: OV with Camri Molloy NP for follow up. She has still been struggling with poor sleep habits and not using her CPAP enough because of this. She and her husband have been watching a new show and have been staying up until around 3 am then she wakes up around 7 am for the day. She has been doing better about putting her CPAP on when she goes to bed but it's been making a lot of noise due to leaks and so she ends up taking it off part way through the night. She is wearing a full face mask still. When she has her CPAP on most of the night, she does feel better rested. She denies drowsy driving or morning headaches. No sleep parasomnias/paralysis.  She did have viral symptoms last week. She still has some postnasal drainage with throat clearing. Feels like her sinuses are full in the morning but improves later. Clear drainage. Has a cough in the morning, usually from the drainage. Slowly feeling better. No fevers, chills, hemoptysis, facial tenderness.  07/08/2023-08/06/2023: CPAP 8-20 cmH2O 24/30 days; 13% >4 hr; average use 2 hr 26 min Pressure 95th 13.9 Leaks 95th 9.9  AHI 1.6  01/08/2024: Today - follow up Patient presents today for follow up. She has still been having difficulties using her CPAP consistently. She falls asleep on the cough most nights and sometimes stays there. She does feel like when she wears the CPAP, she sleeps better. Her PCP started her on Wellbutrin recently and she feels like this has helped her mood. She denies any drowsy driving, morning headaches, sleep parasomnias/paralysis. She ended up purchasing her CPAP out of pocket from the DME because she was not compliant but wanted to continue to use it. She wants to keep working on increasing her use of CPAP.  12/08/2023-01/06/2024: CPAP 8-20  cmH2O 15/30 days; 20% >4 hr; average use 3 hr 26 min Pressure 95th 14.8 Leaks 95th 15.3 AHI 1.1  Allergies  Allergen Reactions   Lisinopril Swelling   Thimerosal (Thiomersal) Itching   Immunization History  Administered Date(s) Administered   Hep B, Unspecified 06/29/1977, 06/11/1982   Influenza Split 09/11/2013   Influenza Whole 12/13/2007   Influenza,inj,Quad PF,6+ Mos 08/29/2017, 08/30/2018, 10/11/2021, 10/11/2022   PPD Test 07/23/2014   Td 12/12/1998, 07/06/2009   Tdap 07/31/2019   Unspecified SARS-COV-2 Vaccination 12/30/2019, 01/17/2020   Past Medical History:  Diagnosis Date   Allergy    Arthritis    knees   Cataract    forming both eyes   Claustrophobia    essential hypertension    Lump or mass in breast    Obesity, unspecified    Other disorder of menstruation and  other abnormal bleeding from female genital tract    Other speech disturbance(784.59)    Urinary incontinence    Urticaria, unspecified     Tobacco History: Social History   Tobacco Use  Smoking Status Never  Smokeless Tobacco Never   Counseling given: Not Answered   Outpatient Medications Prior to Visit  Medication Sig Dispense Refill   acetaminophen (TYLENOL) 500 MG tablet Take 1,000 mg by mouth every 6 (six) hours as needed for moderate pain.     albuterol (VENTOLIN HFA) 108 (90 Base) MCG/ACT inhaler INHALE 2 PUFFS BY MOUTH EVERY 6 HOURS AS NEEDED 8.5 each 1   ALPRAZolam (XANAX) 0.25 MG tablet Take 1 tablet (0.25 mg total) by mouth at bedtime as needed for anxiety. 30 tablet 0   amLODipine (NORVASC) 5 MG tablet TAKE 1 TABLET (5 MG TOTAL) BY MOUTH DAILY. 90 tablet 0   buPROPion (WELLBUTRIN XL) 150 MG 24 hr tablet Take 1 tablet (150 mg total) by mouth daily. 90 tablet 1   Cholecalciferol (VITAMIN D3) 2000 units TABS Take 2,000 Units by mouth every other day.      escitalopram (LEXAPRO) 20 MG tablet TAKE 1 TABLET BY MOUTH EVERY DAY 90 tablet 1   fluticasone (FLONASE) 50 MCG/ACT nasal spray  INSTILL 1 SPRAY INTO BOTH NOSTRILS DAILY (Patient taking differently: as needed.) 24 mL 1   hydrochlorothiazide (HYDRODIURIL) 12.5 MG tablet TAKE 1 TABLET BY MOUTH EVERY DAY 90 tablet 3   ibuprofen (ADVIL,MOTRIN) 200 MG tablet Take 200-400 mg by mouth every 6 (six) hours as needed for moderate pain.     levonorgestrel (MIRENA, 52 MG,) 20 MCG/DAY IUD      methocarbamol (ROBAXIN-750) 750 MG tablet Take 1 tablet (750 mg total) by mouth every 8 (eight) hours as needed for muscle spasms. 20 tablet 0   Multiple Vitamin (MULTIVITAMIN) tablet Take 1 tablet by mouth daily.     nystatin-triamcinolone (MYCOLOG II) cream APP EXT AA BID FOR 7 TO 14 DAYS     oxybutynin (DITROPAN-XL) 5 MG 24 hr tablet Take 1 tablet by mouth as needed.     Polyvinyl Alcohol-Povidone (REFRESH OP) Apply to eye.     triamcinolone ointment (KENALOG) 0.1 % APPLY THIN LAYER TOPICALLY TO THE AFFECTED AREA TWICE DAILY FOR 7 DAYS AS NEEDED     Vitamin D, Ergocalciferol, (DRISDOL) 1.25 MG (50000 UNIT) CAPS capsule TAKE 1 CAPSULE (50,000 UNITS TOTAL) BY MOUTH EVERY 7 (SEVEN) DAYS 12 capsule 2   No facility-administered medications prior to visit.     Review of Systems:   Constitutional: No weight loss or gain, night sweats, fevers, chills, or lassitude. +excessive daytime fatigue (improves with CPAP) HEENT: No difficulty swallowing, tooth/dental problems. No sneezing, itching. +AM dry mouth, headaches (baseline), nasal congestion, post nasal drip CV:  No chest pain, orthopnea, PND, swelling in lower extremities, anasarca, dizziness, palpitations, syncope Resp: +snoring (resolves with CPAP), witnessed apneas, shortness of breath with exertion (baseline), AM cough. No excess mucus or change in color of mucus. No hemoptysis. No wheezing.  No chest wall deformity GI:  No heartburn, indigestion MSK:  No joint pain or swelling.   Neuro: No dizziness or lightheadedness.  Psych: No depression or anxiety. Mood stable.    Observations/Objective: Patient is well-developed, well-nourished in no acute distress. A&Ox3. Resting comfortably at home. Unlabored breathing. Speech is clear and coherent with logical content.    Assessment and Plan: Moderate obstructive sleep apnea Moderate OSA on CPAP. Suboptimal compliance. Aware of risks of untreated OSA.  She does receive benefit from use. Encouraged to increase compliance. Mask fits well and pressures feel appropriate. Aware of proper care/use of device. Safe driving practices reviewed.   Patient Instructions  Increase CPAP usage to every night, minimum of 4-6 hours a night.  Change equipment every 30 days or as directed by DME. Wash your tubing with warm soap and water daily, hang to dry. Wash humidifier portion weekly. Use bottled, distilled water and change daily  Be aware of reduced alertness and do not drive or operate heavy machinery if experiencing this or drowsiness.  Exercise encouraged, as tolerated. Healthy weight management discussed.  Notify if persistent daytime sleepiness occurs even with consistent use of CPAP.   We discussed how untreated sleep apnea puts an individual at risk for cardiac arrhthymias, pulm HTN, DM, stroke and increases their risk for daytime accidents.   Follow up in 6 months with Dr. Wynona Neat or Philis Nettle. If symptoms do not improve or worsen, please contact office for sooner follow up      I discussed the assessment and treatment plan with the patient. The patient was provided an opportunity to ask questions and all were answered. The patient agreed with the plan and demonstrated an understanding of the instructions.   The patient was advised to call back or seek an in-person evaluation if the symptoms worsen or if the condition fails to improve as anticipated.  I provided 22 minutes of non-face-to-face time during this encounter.   Noemi Chapel, NP

## 2024-01-09 ENCOUNTER — Encounter: Payer: Self-pay | Admitting: Nurse Practitioner

## 2024-01-09 NOTE — Patient Instructions (Signed)
Increase CPAP usage to every night, minimum of 4-6 hours a night.  Change equipment every 30 days or as directed by DME. Wash your tubing with warm soap and water daily, hang to dry. Wash humidifier portion weekly. Use bottled, distilled water and change daily  Be aware of reduced alertness and do not drive or operate heavy machinery if experiencing this or drowsiness.  Exercise encouraged, as tolerated. Healthy weight management discussed.  Notify if persistent daytime sleepiness occurs even with consistent use of CPAP.   We discussed how untreated sleep apnea puts an individual at risk for cardiac arrhthymias, pulm HTN, DM, stroke and increases their risk for daytime accidents.   Follow up in 6 months with Dr. Wynona Neat or Philis Nettle. If symptoms do not improve or worsen, please contact office for sooner follow up

## 2024-01-09 NOTE — Assessment & Plan Note (Addendum)
Moderate OSA on CPAP. Suboptimal compliance. Aware of risks of untreated OSA. She does receive benefit from use. Encouraged to increase compliance. Mask fits well and pressures feel appropriate. Aware of proper care/use of device. Safe driving practices reviewed.   Patient Instructions  Increase CPAP usage to every night, minimum of 4-6 hours a night.  Change equipment every 30 days or as directed by DME. Wash your tubing with warm soap and water daily, hang to dry. Wash humidifier portion weekly. Use bottled, distilled water and change daily  Be aware of reduced alertness and do not drive or operate heavy machinery if experiencing this or drowsiness.  Exercise encouraged, as tolerated. Healthy weight management discussed.  Notify if persistent daytime sleepiness occurs even with consistent use of CPAP.   We discussed how untreated sleep apnea puts an individual at risk for cardiac arrhthymias, pulm HTN, DM, stroke and increases their risk for daytime accidents.   Follow up in 6 months with Dr. Wynona Neat or Philis Nettle. If symptoms do not improve or worsen, please contact office for sooner follow up

## 2024-01-11 ENCOUNTER — Other Ambulatory Visit: Payer: Self-pay | Admitting: Adult Health

## 2024-01-11 ENCOUNTER — Encounter: Payer: Self-pay | Admitting: Adult Health

## 2024-01-11 ENCOUNTER — Ambulatory Visit (INDEPENDENT_AMBULATORY_CARE_PROVIDER_SITE_OTHER): Payer: BC Managed Care – PPO | Admitting: Adult Health

## 2024-01-11 VITALS — BP 130/72 | HR 75 | Temp 98.6°F | Ht 66.0 in | Wt 222.0 lb

## 2024-01-11 DIAGNOSIS — F419 Anxiety disorder, unspecified: Secondary | ICD-10-CM

## 2024-01-11 DIAGNOSIS — E559 Vitamin D deficiency, unspecified: Secondary | ICD-10-CM | POA: Diagnosis not present

## 2024-01-11 DIAGNOSIS — Z0000 Encounter for general adult medical examination without abnormal findings: Secondary | ICD-10-CM

## 2024-01-11 DIAGNOSIS — G4733 Obstructive sleep apnea (adult) (pediatric): Secondary | ICD-10-CM

## 2024-01-11 DIAGNOSIS — F32A Depression, unspecified: Secondary | ICD-10-CM

## 2024-01-11 DIAGNOSIS — I10 Essential (primary) hypertension: Secondary | ICD-10-CM | POA: Diagnosis not present

## 2024-01-11 DIAGNOSIS — R7303 Prediabetes: Secondary | ICD-10-CM

## 2024-01-11 LAB — COMPREHENSIVE METABOLIC PANEL
ALT: 16 U/L (ref 0–35)
AST: 19 U/L (ref 0–37)
Albumin: 4.4 g/dL (ref 3.5–5.2)
Alkaline Phosphatase: 66 U/L (ref 39–117)
BUN: 14 mg/dL (ref 6–23)
CO2: 30 meq/L (ref 19–32)
Calcium: 10.4 mg/dL (ref 8.4–10.5)
Chloride: 107 meq/L (ref 96–112)
Creatinine, Ser: 0.99 mg/dL (ref 0.40–1.20)
GFR: 60.19 mL/min (ref >60.00)
Glucose, Bld: 98 mg/dL (ref 70–99)
Potassium: 3.9 meq/L (ref 3.5–5.1)
Sodium: 142 meq/L (ref 135–145)
Total Bilirubin: 0.5 mg/dL (ref 0.2–1.2)
Total Protein: 7.3 g/dL (ref 6.0–8.3)

## 2024-01-11 LAB — CBC WITH DIFFERENTIAL/PLATELET
Basophils Absolute: 0 10*3/uL (ref 0.0–0.1)
Basophils Relative: 0.6 % (ref 0.0–3.0)
Eosinophils Absolute: 0.2 10*3/uL (ref 0.0–0.7)
Eosinophils Relative: 3.3 % (ref 0.0–5.0)
HCT: 41.4 % (ref 36.0–46.0)
Hemoglobin: 13.2 g/dL (ref 12.0–15.0)
Lymphocytes Relative: 30 % (ref 12.0–46.0)
Lymphs Abs: 1.8 10*3/uL (ref 0.7–4.0)
MCHC: 31.8 g/dL (ref 30.0–36.0)
MCV: 85.8 fL (ref 78.0–100.0)
Monocytes Absolute: 0.5 10*3/uL (ref 0.1–1.0)
Monocytes Relative: 8.1 % (ref 3.0–12.0)
Neutro Abs: 3.4 10*3/uL (ref 1.4–7.7)
Neutrophils Relative %: 58 % (ref 43.0–77.0)
Platelets: 246 10*3/uL (ref 150.0–400.0)
RBC: 4.82 Mil/uL (ref 3.87–5.11)
RDW: 15 % (ref 11.5–15.5)
WBC: 5.9 10*3/uL (ref 4.0–10.5)

## 2024-01-11 LAB — LIPID PANEL
Cholesterol: 183 mg/dL (ref 0–200)
HDL: 76.1 mg/dL (ref >39.00)
LDL Cholesterol: 97 mg/dL (ref 0–99)
NonHDL: 106.88
Total CHOL/HDL Ratio: 2
Triglycerides: 48 mg/dL (ref 0.0–149.0)
VLDL: 9.6 mg/dL (ref 0.0–40.0)

## 2024-01-11 LAB — VITAMIN D 25 HYDROXY (VIT D DEFICIENCY, FRACTURES): VITD: 22.2 ng/mL — ABNORMAL LOW (ref 30.00–100.00)

## 2024-01-11 LAB — TSH: TSH: 1.04 u[IU]/mL (ref 0.35–5.50)

## 2024-01-11 LAB — HEMOGLOBIN A1C: Hgb A1c MFr Bld: 6.2 % (ref 4.6–6.5)

## 2024-01-11 MED ORDER — VITAMIN D (ERGOCALCIFEROL) 1.25 MG (50000 UNIT) PO CAPS: 50000.0000 [IU] | ORAL_CAPSULE | ORAL | 2 refills | Status: AC

## 2024-01-11 NOTE — Progress Notes (Signed)
Subjective:    Patient ID: Karen Pope, female    DOB: 1961-07-17, 63 y.o.   MRN: [id number]  HPI Patient presents for yearly preventative medicine examination. She is a pleasant 63 year old female who  has a past medical history of Allergy, Arthritis, Cataract, Claustrophobia, essential hypertension, Lump or mass in breast, Obesity, unspecified, Other disorder of menstruation and other abnormal bleeding from female genital tract, Other speech disturbance(784.59), Urinary incontinence, and Urticaria, unspecified.  Hypertension-managed with Norvasc 2.5 mg twice daily and hydrochlorothiazide 25 mg as needed for fluid retention.  She denies dizziness, lightheadedness, chest pain, or shortness of breath BP Readings from Last 3 Encounters:  01/11/24 130/72  08/07/23 120/70  06/26/23 128/84   Anxiety and depression-managed with Lexapro 20 mg daily and Wellbutrin 150mg  XR daily. .  She feels controlled on this medication.  Take Xanax 0.25 mg infrequently  Vitamin D Deficiency - takes vitamin D supplement periodically  Last vitamin D Lab Results  Component Value Date   VD25OH 19.93 (L) 12/08/2022   OSA -she does use a CPAP but has difficulties using consistently.  When she does wear her CPAP she feels as though she sleeps better.  Prediabetes - not currently on medication  Lab Results  Component Value Date   HGBA1C 5.9 12/08/2022   HGBA1C 6.0 12/01/2021   HGBA1C 5.8 08/26/2014   Double Vision - she reports that for the last year she has been experiencing double vision. Her eye doctor has made a referral to Neuro ophthalmologist   Submandibular mass - CT scan showed 12 mm nodule near the right mandibular angle, indeterminate. She does not feel as though this has grown but is tender to touch. She has an upcoming appointment with ENT.   All immunizations and health maintenance protocols were reviewed with the patient and needed orders were placed.  Appropriate screening laboratory  values were ordered for the patient including screening of hyperlipidemia, renal function and hepatic function.  Medication reconciliation,  past medical history, social history, problem list and allergies were reviewed in detail with the patient  Goals were established with regard to weight loss, exercise, and  diet in compliance with medications Wt Readings from Last 3 Encounters:  01/11/24 222 lb (100.7 kg)  11/15/23 219 lb (99.3 kg)  10/16/23 221 lb (100.2 kg)   She will call and set up her GYN appointment   Review of Systems  Constitutional: Negative.   HENT: Negative.    Eyes:  Positive for visual disturbance.  Respiratory: Negative.    Cardiovascular: Negative.   Gastrointestinal: Negative.   Endocrine: Negative.   Genitourinary: Negative.   Musculoskeletal:  Positive for arthralgias.  Skin: Negative.   Allergic/Immunologic: Negative.   Neurological: Negative.   Hematological: Negative.   Psychiatric/Behavioral: Negative.     Past Medical History:  Diagnosis Date   Allergy    Arthritis    knees   Cataract    forming both eyes   Claustrophobia    essential hypertension    Lump or mass in breast    Obesity, unspecified    Other disorder of menstruation and other abnormal bleeding from female genital tract    Other speech disturbance(784.59)    Urinary incontinence    Urticaria, unspecified     Social History   Socioeconomic History   Marital status: Married    Spouse name: Not on file   Number of children: Not on file   Years of education: Not on file  Highest education level: Master's degree (e.g., MA, MS, MEng, MEd, MSW, MBA)  Occupational History   Not on file  Tobacco Use   Smoking status: Never   Smokeless tobacco: Never  Vaping Use   Vaping status: Never Used  Substance and Sexual Activity   Alcohol use: Yes    Comment: rare- 1 x q 4 months   Drug use: No   Sexual activity: Not on file  Other Topics Concern   Not on file  Social History  Narrative   Not on file   Social Drivers of Health   Financial Resource Strain: Low Risk  (01/06/2023)   Overall Financial Resource Strain (CARDIA)    Difficulty of Paying Living Expenses: Not very hard  Food Insecurity: No Food Insecurity (01/06/2023)   Hunger Vital Sign    Worried About Running Out of Food in the Last Year: Never true    Ran Out of Food in the Last Year: Never true  Transportation Needs: No Transportation Needs (01/06/2023)   PRAPARE - Administrator, Civil Service (Medical): No    Lack of Transportation (Non-Medical): No  Physical Activity: Insufficiently Active (01/06/2023)   Exercise Vital Sign    Days of Exercise per Week: 1 day    Minutes of Exercise per Session: 10 min  Stress: No Stress Concern Present (01/06/2023)   [employer] of Occupational Health - Occupational Stress Questionnaire    Feeling of Stress : Not at all  Social Connections: Unknown (01/06/2023)   Social Connection and Isolation Panel [NHANES]    Frequency of Communication with Friends and Family: Three times a week    Frequency of Social Gatherings with Friends and Family: Patient declined    Attends Religious Services: Patient declined    Active Member of Clubs or Organizations: Yes    Attends Engineer, structural: More than 4 times per year    Marital Status: Not on file  Intimate Partner Violence: Not on file    Past Surgical History:  Procedure Laterality Date   BREAST BIOPSY     benign    CHOLECYSTECTOMY N/A 10/07/2018   Procedure: LAPAROSCOPIC CHOLECYSTECTOMY WITH INTRAOPERATIVE CHOLANGIOGRAM;  Surgeon: Darnell Level, MD;  Location: [facility];  Service: General;  Laterality: N/A;   COLONOSCOPY     hysteroscopy  12/02/2022   TONSILLECTOMY  1979   TUBAL LIGATION  1997   WISDOM TOOTH EXTRACTION     1980's    Family History  Problem Relation Age of Onset   Breast cancer Mother 62   Leukemia Maternal Aunt        leukemia- type cancer   Breast cancer  Maternal Aunt 87   Prostate cancer Maternal Uncle    Cancer Paternal Aunt        either uterine or cervical cancer   Pancreatic cancer Cousin        70    Prostate cancer Half-Brother    Colon cancer Other        in his 24's   Pancreatic cancer Other        in his 72's   Colon polyps Neg Hx    Esophageal cancer Neg Hx    Rectal cancer Neg Hx    Stomach cancer Neg Hx     Allergies  Allergen Reactions   Lisinopril Swelling   Thimerosal (Thiomersal) Itching    Current Outpatient Medications on File Prior to Visit  Medication Sig Dispense Refill   acetaminophen (TYLENOL) 500 MG tablet  Take 1,000 mg by mouth every 6 (six) hours as needed for moderate pain.     albuterol (VENTOLIN HFA) 108 (90 Base) MCG/ACT inhaler INHALE 2 PUFFS BY MOUTH EVERY 6 HOURS AS NEEDED 8.5 each 1   ALPRAZolam (XANAX) 0.25 MG tablet Take 1 tablet (0.25 mg total) by mouth at bedtime as needed for anxiety. 30 tablet 0   amLODipine (NORVASC) 5 MG tablet TAKE 1 TABLET (5 MG TOTAL) BY MOUTH DAILY. 90 tablet 0   buPROPion (WELLBUTRIN XL) 150 MG 24 hr tablet Take 1 tablet (150 mg total) by mouth daily. 90 tablet 1   Cholecalciferol (VITAMIN D3) 2000 units TABS Take 2,000 Units by mouth every other day.      escitalopram (LEXAPRO) 20 MG tablet TAKE 1 TABLET BY MOUTH EVERY DAY 90 tablet 1   fluticasone (FLONASE) 50 MCG/ACT nasal spray INSTILL 1 SPRAY INTO BOTH NOSTRILS DAILY (Patient taking differently: as needed.) 24 mL 1   hydrochlorothiazide (HYDRODIURIL) 12.5 MG tablet TAKE 1 TABLET BY MOUTH EVERY DAY 90 tablet 3   ibuprofen (ADVIL,MOTRIN) 200 MG tablet Take 200-400 mg by mouth every 6 (six) hours as needed for moderate pain.     levonorgestrel (MIRENA, 52 MG,) 20 MCG/DAY IUD      methocarbamol (ROBAXIN-750) 750 MG tablet Take 1 tablet (750 mg total) by mouth every 8 (eight) hours as needed for muscle spasms. 20 tablet 0   Multiple Vitamin (MULTIVITAMIN) tablet Take 1 tablet by mouth daily.      nystatin-triamcinolone (MYCOLOG II) cream APP EXT AA BID FOR 7 TO 14 DAYS     Polyvinyl Alcohol-Povidone (REFRESH OP) Apply to eye.     triamcinolone ointment (KENALOG) 0.1 % APPLY THIN LAYER TOPICALLY TO THE AFFECTED AREA TWICE DAILY FOR 7 DAYS AS NEEDED     Vitamin D, Ergocalciferol, (DRISDOL) 1.25 MG (50000 UNIT) CAPS capsule TAKE 1 CAPSULE (50,000 UNITS TOTAL) BY MOUTH EVERY 7 (SEVEN) DAYS 12 capsule 2   No current facility-administered medications on file prior to visit.    BP 130/72   Pulse 75   Temp 98.6 F (37 C) (Oral)   Ht 5\' 6"  (1.676 m)   Wt 222 lb (100.7 kg)   LMP 10/27/2019 (Exact Date)   SpO2 96%   BMI 35.83 kg/m       Objective:   Physical Exam Vitals and nursing note reviewed.  Constitutional:      General: She is not in acute distress.    Appearance: Normal appearance. She is not ill-appearing.  HENT:     Head: Normocephalic and atraumatic.      Right Ear: Tympanic membrane, ear canal and external ear normal. There is no impacted cerumen.     Left Ear: Tympanic membrane, ear canal and external ear normal. There is no impacted cerumen.     Nose: Nose normal. No congestion or rhinorrhea.     Mouth/Throat:     Mouth: Mucous membranes are moist.     Pharynx: Oropharynx is clear.  Eyes:     Extraocular Movements: Extraocular movements intact.     Conjunctiva/sclera: Conjunctivae normal.     Pupils: Pupils are equal, round, and reactive to light.  Neck:     Vascular: No carotid bruit.  Cardiovascular:     Rate and Rhythm: Normal rate and regular rhythm.     Pulses: Normal pulses.     Heart sounds: No murmur heard.    No friction rub. No gallop.  Pulmonary:  Effort: Pulmonary effort is normal.     Breath sounds: Normal breath sounds.  Abdominal:     General: Abdomen is flat. Bowel sounds are normal. There is no distension.     Palpations: Abdomen is soft. There is no mass.     Tenderness: There is no abdominal tenderness. There is no guarding or  rebound.     Hernia: No hernia is present.  Musculoskeletal:        General: Normal range of motion.     Cervical back: Normal range of motion and neck supple.  Lymphadenopathy:     Cervical: No cervical adenopathy.  Skin:    General: Skin is warm and dry.     Capillary Refill: Capillary refill takes less than 2 seconds.  Neurological:     General: No focal deficit present.     Mental Status: She is alert and oriented to person, place, and time.  Psychiatric:        Mood and Affect: Mood normal.        Behavior: Behavior normal.        Thought Content: Thought content normal.        Judgment: Judgment normal.       Assessment & Plan:  1. Routine general medical examination at a health care facility (Primary) Today patient counseled on age appropriate routine health concerns for screening and prevention, each reviewed and up to date or declined. Immunizations reviewed and up to date or declined. Labs ordered and reviewed. Risk factors for depression reviewed and negative. Hearing function and visual acuity are intact. ADLs screened and addressed as needed. Functional ability and level of safety reviewed and appropriate. Education, counseling and referrals performed based on assessed risks today. Patient provided with a copy of personalized plan for preventive services. - Follow up in one year or sooner If needed - Work on weight loss with lifestyle modifications.   2. Primary hypertension - Well controlled. No change in medication  - CBC with Differential/Platelet; Future - Comprehensive metabolic panel; Future - Lipid panel; Future - TSH; Future  3. Anxiety and depression - Continue with Wellbutrin and Celexa  - CBC with Differential/Platelet; Future - Comprehensive metabolic panel; Future - Lipid panel; Future - TSH; Future  4. Vitamin D deficiency - Consider high dose replacement  - VITAMIN D 25 Hydroxy (Vit-D Deficiency, Fractures); Future  5. OSA (obstructive sleep  apnea) - Encouraged to wear CPAP nightly.  - CBC with Differential/Platelet; Future - Comprehensive metabolic panel; Future - Lipid panel; Future - TSH; Future - Hemoglobin A1c; Future  6. Prediabetes - Consider metformin  - CBC with Differential/Platelet; Future - Comprehensive metabolic panel; Future - Lipid panel; Future - TSH; Future - Hemoglobin A1c; Future   Shirline Frees, NP

## 2024-01-11 NOTE — Patient Instructions (Signed)
It was great seeing you today   We will follow up with you regarding your lab work   Please let me know if you need anything

## 2024-01-29 DIAGNOSIS — R0989 Other specified symptoms and signs involving the circulatory and respiratory systems: Secondary | ICD-10-CM | POA: Diagnosis not present

## 2024-02-11 ENCOUNTER — Encounter: Payer: Self-pay | Admitting: Emergency Medicine

## 2024-02-11 ENCOUNTER — Ambulatory Visit
Admission: EM | Admit: 2024-02-11 | Discharge: 2024-02-11 | Disposition: A | Discharge status: Home / Self Care | Attending: Internal Medicine | Admitting: Internal Medicine

## 2024-02-11 DIAGNOSIS — M62838 Other muscle spasm: Secondary | ICD-10-CM | POA: Diagnosis not present

## 2024-02-11 DIAGNOSIS — M545 Low back pain, unspecified: Secondary | ICD-10-CM

## 2024-02-11 MED ORDER — DEXAMETHASONE SODIUM PHOSPHATE 10 MG/ML IJ SOLN
10.0000 mg | Freq: Once | INTRAMUSCULAR | Status: AC
  Administered 2024-02-11: 10 mg via INTRAMUSCULAR

## 2024-02-11 MED ORDER — BACLOFEN 10 MG PO TABS
10.0000 mg | ORAL_TABLET | Freq: Three times a day (TID) | ORAL | 0 refills | Status: DC
Start: 2024-02-11 — End: 2024-07-12
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
## 2024-02-23 ENCOUNTER — Other Ambulatory Visit: Payer: Self-pay

## 2024-02-23 ENCOUNTER — Encounter (HOSPITAL_BASED_OUTPATIENT_CLINIC_OR_DEPARTMENT_OTHER): Payer: Self-pay | Admitting: Emergency Medicine

## 2024-02-23 ENCOUNTER — Ambulatory Visit: Payer: Self-pay | Admitting: Adult Health

## 2024-02-23 ENCOUNTER — Emergency Department (HOSPITAL_BASED_OUTPATIENT_CLINIC_OR_DEPARTMENT_OTHER)

## 2024-02-23 DIAGNOSIS — Z79899 Other long term (current) drug therapy: Secondary | ICD-10-CM | POA: Insufficient documentation

## 2024-02-23 DIAGNOSIS — M4802 Spinal stenosis, cervical region: Secondary | ICD-10-CM | POA: Diagnosis not present

## 2024-02-23 DIAGNOSIS — M47812 Spondylosis without myelopathy or radiculopathy, cervical region: Secondary | ICD-10-CM | POA: Diagnosis not present

## 2024-02-23 DIAGNOSIS — M47816 Spondylosis without myelopathy or radiculopathy, lumbar region: Secondary | ICD-10-CM | POA: Diagnosis not present

## 2024-02-23 DIAGNOSIS — I10 Essential (primary) hypertension: Secondary | ICD-10-CM | POA: Diagnosis not present

## 2024-02-23 DIAGNOSIS — M542 Cervicalgia: Secondary | ICD-10-CM | POA: Diagnosis not present

## 2024-02-23 DIAGNOSIS — M545 Low back pain, unspecified: Secondary | ICD-10-CM | POA: Diagnosis not present

## 2024-02-23 DIAGNOSIS — Y92410 Unspecified street and highway as the place of occurrence of the external cause: Secondary | ICD-10-CM | POA: Insufficient documentation

## 2024-02-23 DIAGNOSIS — Z041 Encounter for examination and observation following transport accident: Secondary | ICD-10-CM | POA: Diagnosis not present

## 2024-02-23 DIAGNOSIS — M549 Dorsalgia, unspecified: Secondary | ICD-10-CM | POA: Diagnosis not present

## 2024-02-23 DIAGNOSIS — M25551 Pain in right hip: Secondary | ICD-10-CM | POA: Insufficient documentation

## 2024-02-23 DIAGNOSIS — R7309 Other abnormal glucose: Secondary | ICD-10-CM | POA: Insufficient documentation

## 2024-02-23 DIAGNOSIS — R0789 Other chest pain: Secondary | ICD-10-CM | POA: Diagnosis not present

## 2024-02-23 DIAGNOSIS — R079 Chest pain, unspecified: Secondary | ICD-10-CM | POA: Diagnosis not present

## 2024-02-23 LAB — BASIC METABOLIC PANEL
Anion gap: 8 (ref 5–15)
BUN: 12 mg/dL (ref 8–23)
CO2: 25 mmol/L (ref 22–32)
Calcium: 10.5 mg/dL — ABNORMAL HIGH (ref 8.9–10.3)
Chloride: 106 mmol/L (ref 98–111)
Creatinine, Ser: 0.81 mg/dL (ref 0.44–1.00)
GFR, Estimated: >60 mL/min (ref >60)
Glucose, Bld: 110 mg/dL — ABNORMAL HIGH (ref 70–99)
Potassium: 3.8 mmol/L (ref 3.5–5.1)
Sodium: 139 mmol/L (ref 135–145)

## 2024-02-23 LAB — CBC
HCT: 42.5 % (ref 36.0–46.0)
Hemoglobin: 13.5 g/dL (ref 12.0–15.0)
MCH: 27.3 pg (ref 26.0–34.0)
MCHC: 31.8 g/dL (ref 30.0–36.0)
MCV: 85.9 fL (ref 80.0–100.0)
Platelets: 221 10*3/uL (ref 150–400)
RBC: 4.95 MIL/uL (ref 3.87–5.11)
RDW: 14.6 % (ref 11.5–15.5)
WBC: 6.6 10*3/uL (ref 4.0–10.5)
nRBC: 0 % (ref 0.0–0.2)

## 2024-02-23 LAB — TROPONIN I (HIGH SENSITIVITY): Troponin I (High Sensitivity): 3 ng/L (ref <18)
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
## 2024-02-23 NOTE — Telephone Encounter (Signed)
 Spoke to pt and she stated she is otw to ED.

## 2024-02-24 ENCOUNTER — Other Ambulatory Visit: Payer: Self-pay | Admitting: Adult Health

## 2024-02-24 ENCOUNTER — Emergency Department (HOSPITAL_BASED_OUTPATIENT_CLINIC_OR_DEPARTMENT_OTHER)
Admission: EM | Admit: 2024-02-24 | Discharge: 2024-02-24 | Disposition: A | Discharge status: Home / Self Care | Attending: Emergency Medicine | Admitting: Emergency Medicine

## 2024-02-24 DIAGNOSIS — R079 Chest pain, unspecified: Secondary | ICD-10-CM

## 2024-02-24 DIAGNOSIS — I10 Essential (primary) hypertension: Secondary | ICD-10-CM

## 2024-02-24 DIAGNOSIS — R739 Hyperglycemia, unspecified: Secondary | ICD-10-CM

## 2024-02-24 MED ORDER — HYDROCHLOROTHIAZIDE 12.5 MG PO TABS: 25.0000 mg | ORAL_TABLET | Freq: Every day | ORAL | Status: DC
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
## 2024-02-24 NOTE — Discharge Instructions (Addendum)
 Apply ice to areas that are sore.  Ice should be applied for 30 minutes at a time, 4 times a day.  Continue to take acetaminophen and/or ibuprofen as needed for pain.  Please increase your hydrochlorothiazide dose to 25 mg a day.  Try to stay on a low-salt diet.  Please check your blood pressure once a day, every day.  Keep a record of it and take that record with you whenever you see your primary care provider.

## 2024-03-04 ENCOUNTER — Ambulatory Visit: Admitting: Physician Assistant

## 2024-03-11 DIAGNOSIS — H3530 Unspecified macular degeneration: Secondary | ICD-10-CM | POA: Diagnosis not present

## 2024-03-11 DIAGNOSIS — H532 Diplopia: Secondary | ICD-10-CM | POA: Diagnosis not present

## 2024-03-11 DIAGNOSIS — H35371 Puckering of macula, right eye: Secondary | ICD-10-CM | POA: Diagnosis not present

## 2024-03-13 ENCOUNTER — Ambulatory Visit (INDEPENDENT_AMBULATORY_CARE_PROVIDER_SITE_OTHER): Admitting: Adult Health

## 2024-03-13 ENCOUNTER — Ambulatory Visit: Admitting: Physician Assistant

## 2024-03-13 VITALS — BP 130/88 | HR 71 | Temp 98.0°F | Ht 67.0 in | Wt 226.0 lb

## 2024-03-13 DIAGNOSIS — S161XXD Strain of muscle, fascia and tendon at neck level, subsequent encounter: Secondary | ICD-10-CM

## 2024-03-13 DIAGNOSIS — I10 Essential (primary) hypertension: Secondary | ICD-10-CM

## 2024-03-13 DIAGNOSIS — T148XXA Other injury of unspecified body region, initial encounter: Secondary | ICD-10-CM | POA: Diagnosis not present

## 2024-03-13 DIAGNOSIS — M5416 Radiculopathy, lumbar region: Secondary | ICD-10-CM

## 2024-03-13 DIAGNOSIS — R202 Paresthesia of skin: Secondary | ICD-10-CM

## 2024-03-13 MED ORDER — AMLODIPINE BESYLATE 2.5 MG PO TABS: 2.5000 mg | ORAL_TABLET | Freq: Every day | ORAL | 0 refills | Status: DC

## 2024-03-13 MED ORDER — METHYLPREDNISOLONE 4 MG PO TABS: ORAL_TABLET | ORAL | 0 refills | Status: DC

## 2024-03-13 NOTE — Patient Instructions (Signed)
 It was great seeing you today   Follow up Monday for labs at [facility] office.   Increase Norvasc to 7.5 mg
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
## 2024-03-18 ENCOUNTER — Other Ambulatory Visit (INDEPENDENT_AMBULATORY_CARE_PROVIDER_SITE_OTHER)

## 2024-03-18 DIAGNOSIS — I10 Essential (primary) hypertension: Secondary | ICD-10-CM | POA: Diagnosis not present

## 2024-03-18 LAB — BASIC METABOLIC PANEL WITH GFR
BUN: 18 mg/dL (ref 6–23)
CO2: 29 meq/L (ref 19–32)
Calcium: 10.6 mg/dL — ABNORMAL HIGH (ref 8.4–10.5)
Chloride: 104 meq/L (ref 96–112)
Creatinine, Ser: 1.04 mg/dL (ref 0.40–1.20)
GFR: 56.66 mL/min — ABNORMAL LOW (ref >60.00)
Glucose, Bld: 107 mg/dL — ABNORMAL HIGH (ref 70–99)
Potassium: 3.9 meq/L (ref 3.5–5.1)
Sodium: 142 meq/L (ref 135–145)

## 2024-03-19 ENCOUNTER — Encounter: Payer: Self-pay | Admitting: Adult Health

## 2024-04-08 ENCOUNTER — Ambulatory Visit: Attending: Physician Assistant

## 2024-04-08 ENCOUNTER — Encounter: Payer: Self-pay | Admitting: Orthopaedic Surgery

## 2024-04-08 ENCOUNTER — Ambulatory Visit: Admitting: Orthopaedic Surgery

## 2024-04-08 DIAGNOSIS — M5416 Radiculopathy, lumbar region: Secondary | ICD-10-CM | POA: Diagnosis not present

## 2024-04-08 MED ORDER — GABAPENTIN 300 MG PO CAPS: 300.0000 mg | ORAL_CAPSULE | Freq: Every day | ORAL | 1 refills | Status: DC
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
## 2024-04-12 ENCOUNTER — Ambulatory Visit (INDEPENDENT_AMBULATORY_CARE_PROVIDER_SITE_OTHER): Admitting: Ophthalmology

## 2024-04-12 ENCOUNTER — Encounter (INDEPENDENT_AMBULATORY_CARE_PROVIDER_SITE_OTHER): Payer: Self-pay | Admitting: Ophthalmology

## 2024-04-12 DIAGNOSIS — H35373 Puckering of macula, bilateral: Secondary | ICD-10-CM | POA: Diagnosis not present

## 2024-04-12 DIAGNOSIS — H25813 Combined forms of age-related cataract, bilateral: Secondary | ICD-10-CM | POA: Diagnosis not present

## 2024-04-12 DIAGNOSIS — H35033 Hypertensive retinopathy, bilateral: Secondary | ICD-10-CM | POA: Diagnosis not present

## 2024-04-12 DIAGNOSIS — I10 Essential (primary) hypertension: Secondary | ICD-10-CM

## 2024-04-12 NOTE — Progress Notes (Signed)
 [facility] - Clinic Note  04/12/2024   CHIEF COMPLAINT Patient presents for Retina Evaluation  HISTORY OF PRESENT ILLNESS: Karen Pope is a 63 y.o. female who presents to the clinic today for:  HPI     Retina Evaluation   In both eyes.  I, the attending physician,  performed the HPI with the patient and updated documentation appropriately.        Comments   New pt ret eval referred by Dr. Jolena Nay for ARMD OU. Pt reports for a few years she has experienced double [place], pt has been given prism  rx in the past that did not help. Pt has diplopia OU per Dr. Jolena Nay. She does wear rx specs. She reports some waviness in straight lines. She was told by Dr. Jolena Nay she had ARMD. Since Monday she does report some blurrier vision OD, floaters in OU but that is not a new symptom. Some aching pain in OD for the last few days. Pt does use Refresh gtts PRN. Hx of HTN. Pt does report being pre diabetic, she is not medicated for it.       Last edited by Ronelle Coffee, MD on 04/14/2024 11:08 PM.    Pt is here on the referral of Dr. Jolena Nay for concern of ERM OU, pt states she was referred to Dr. Jolena Nay by Dr. Allison Ivory due to double vision x2 years, she was told by Dr. Jolena Nay that she has diplopia in each eye individually and both eyes together, cataracts and macular degeneration, pt states she noticed a change in her vision this week, she states with or without glasses, her vision is blurry, she sees a little distortion in straight lines with her right eye, pt states she sees floaters that look like dark dots in both eyes   Referring physician: Lorena Rolling, MD [address] [place],  [place] [address]  HISTORICAL INFORMATION:  Selected notes from the MEDICAL RECORD NUMBER Referred by Dr. Jolena Nay for retina eval LEE:  Ocular Hx- PMH-   CURRENT MEDICATIONS: Current Outpatient Medications (Ophthalmic Drugs)  Medication Sig   Polyvinyl Alcohol-Povidone (REFRESH  OP) Apply to eye.   No current facility-administered medications for this visit. (Ophthalmic Drugs)   Current Outpatient Medications (Other)  Medication Sig   acetaminophen  (TYLENOL ) 500 MG tablet Take 1,000 mg by mouth every 6 (six) hours as needed for moderate pain.   albuterol  (VENTOLIN  HFA) 108 (90 Base) MCG/ACT inhaler INHALE 2 PUFFS BY MOUTH EVERY 6 HOURS AS NEEDED   ALPRAZolam  (XANAX ) 0.25 MG tablet Take 1 tablet (0.25 mg total) by mouth at bedtime as needed for anxiety.   amLODipine  (NORVASC ) 2.5 MG tablet Take 1 tablet (2.5 mg total) by mouth daily.   baclofen  (LIORESAL ) 10 MG tablet Take 1 tablet (10 mg total) by mouth 3 (three) times daily.   buPROPion  (WELLBUTRIN  XL) 150 MG 24 hr tablet Take 1 tablet (150 mg total) by mouth daily.   Cholecalciferol (VITAMIN D3) 2000 units TABS Take 2,000 Units by mouth every other day.    escitalopram  (LEXAPRO ) 20 MG tablet TAKE 1 TABLET BY MOUTH EVERY DAY   fluticasone  (FLONASE ) 50 MCG/ACT nasal spray INSTILL 1 SPRAY INTO BOTH NOSTRILS DAILY (Patient taking differently: as needed.)   gabapentin  (NEURONTIN ) 300 MG capsule Take 1 capsule (300 mg total) by mouth at bedtime.   hydrochlorothiazide  (HYDRODIURIL ) 12.5 MG tablet Take 2 tablets (25 mg total) by mouth daily.   ibuprofen (ADVIL,MOTRIN) 200 MG tablet Take  200-400 mg by mouth every 6 (six) hours as needed for moderate pain.   levonorgestrel  (MIRENA , 52 MG,) 20 MCG/DAY IUD    methocarbamol  (ROBAXIN -750) 750 MG tablet Take 1 tablet (750 mg total) by mouth every 8 (eight) hours as needed for muscle spasms.   methylPREDNISolone  (MEDROL ) 4 MG tablet Take as directed   Multiple Vitamin (MULTIVITAMIN) tablet Take 1 tablet by mouth daily.   Multiple Vitamins-Minerals (PRESERVISION AREDS PO) Take by mouth.   nystatin-triamcinolone (MYCOLOG II) cream APP EXT AA BID FOR 7 TO 14 DAYS   triamcinolone ointment (KENALOG) 0.1 % APPLY THIN LAYER TOPICALLY TO THE AFFECTED AREA TWICE DAILY FOR 7 DAYS AS NEEDED    Vitamin D , Ergocalciferol , (DRISDOL ) 1.25 MG (50000 UNIT) CAPS capsule Take 1 capsule (50,000 Units total) by mouth every 7 (seven) days.   No current facility-administered medications for this visit. (Other)   REVIEW OF SYSTEMS: ROS   Positive for: Endocrine, Cardiovascular, Eyes, Psychiatric Negative for: Constitutional, Gastrointestinal, Neurological, Skin, Genitourinary, Musculoskeletal, HENT, Respiratory, Allergic/Imm, Heme/Lymph Last edited by Anthony Bateman, COT on 04/12/2024  8:30 AM.     ALLERGIES Allergies  Allergen Reactions   Lisinopril  Swelling   Paremyd [Hydroxyamphetamine-Tropicamide]     Chest pain   Phenylephrine      Chest pain   Thimerosal (Thiomersal) Itching   PAST MEDICAL HISTORY Past Medical History:  Diagnosis Date   Allergy    Arthritis    knees   Cataract    forming both eyes   Claustrophobia    Diplopia    essential hypertension    Lump or mass in breast    Obesity, unspecified    Other disorder of menstruation and other abnormal bleeding from female genital tract    Other speech disturbance(784.59)    Urinary incontinence    Urticaria, unspecified    Past Surgical History:  Procedure Laterality Date   BREAST BIOPSY     benign    CHOLECYSTECTOMY N/A 10/07/2018   Procedure: LAPAROSCOPIC CHOLECYSTECTOMY WITH INTRAOPERATIVE CHOLANGIOGRAM;  Surgeon: Oralee Billow, MD;  Location: [facility];  Service: General;  Laterality: N/A;   COLONOSCOPY     hysteroscopy  12/02/2022   TONSILLECTOMY  1979   TUBAL LIGATION  1997   WISDOM TOOTH EXTRACTION     1980's   FAMILY HISTORY Family History  Problem Relation Age of Onset   Breast cancer Mother 15   Leukemia Maternal Aunt        leukemia- type cancer   Breast cancer Maternal Aunt 87   Prostate cancer Maternal Uncle    Cancer Paternal Aunt        either uterine or cervical cancer   Pancreatic cancer Cousin        63    Prostate cancer Half-Brother    Colon cancer Other        in his 68's    Pancreatic cancer Other        in his 36's   Colon polyps Neg Hx    Esophageal cancer Neg Hx    Rectal cancer Neg Hx    Stomach cancer Neg Hx    SOCIAL HISTORY Social History   Tobacco Use   Smoking status: Never   Smokeless tobacco: Never  Vaping Use   Vaping status: Never Used  Substance Use Topics   Alcohol use: Yes    Comment: rare- 1 x q 4 months   Drug use: No       OPHTHALMIC EXAM:  Base Eye Exam  Visual Acuity (Snellen - Linear)       Right Left   Dist cc 20/25 -2 20/25 +1   Dist ph cc NI NI    Correction: Glasses         Tonometry (Tonopen, 8:41 AM)       Right Left   Pressure 21 21         Pupils       Pupils Dark Light Shape React APD   Right PERRL 5 4 Round Brisk None   Left PERRL 6 5 Round Brisk None         Visual Fields (Counting fingers)       Left Right    Full Full         Extraocular Movement       Right Left    Full Full         Neuro/Psych     Oriented x3: Yes   Mood/Affect: Normal         Dilation     Both eyes: 1.0% Mydriacyl, 2.5% Phenylephrine  @ 8:42 AM           Slit Lamp and Fundus Exam     Slit Lamp Exam       Right Left   Lids/Lashes Dermatochalasis - upper lid Dermatochalasis - upper lid   Conjunctiva/Sclera mild melanosis mild melanosis   Cornea trace PEE arcus, trace PEE   Anterior Chamber deep and clear deep and clear   Iris Round and dilated Round and dilated   Lens 2+ Nuclear sclerosis, 2+ Cortical cataract 2+ Nuclear sclerosis, 2+ Cortical cataract   Anterior Vitreous mild syneresis, Posterior vitreous detachment mild syneresis, Posterior vitreous detachment         Fundus Exam       Right Left   Disc Pink and Sharp Pink and Sharp   C/D Ratio 0.5 0.6   Macula Good foveal reflex, ERM with striae, no heme Flat, Good foveal reflex, mild ERM with mild striae inferiorly   Vessels mild attenuation, mild tortuosity mild attenuation, mild tortuosity, mild copper wiring    Periphery Attached, mild white without pressure temporal periphery, No heme Attached, mild white without pressure temporal periphery, No heme           Refraction     Wearing Rx       Sphere Cylinder Axis Add   Right -1.25 +0.50 071 +2.50   Left -1.50 +0.75 145 +2.50           IMAGING AND PROCEDURES  Imaging and Procedures for 04/12/2024  OCT, Retina - OU - Both Eyes       Right Eye Quality was good. Central Foveal Thickness: 447. Progression has no prior data. Findings include no IRF, no SRF, abnormal foveal contour, epiretinal membrane, macular pucker, preretinal fibrosis (ERM with pucker and PRF greatest superior macula).   Left Eye Quality was good. Central Foveal Thickness: 287. Progression has no prior data. Findings include normal foveal contour, no IRF, no SRF, epiretinal membrane, macular pucker (Mild ERM with early pucker inferior macula).   Notes *Images captured and stored on drive  Diagnosis / Impression:  ERM OU OD: ERM with pucker and PRF greatest superior macula OS: Mild ERM with early pucker inferior macula  Clinical management:  See below  Abbreviations: NFP - Normal foveal profile. CME - cystoid macular edema. PED - pigment epithelial detachment. IRF - intraretinal fluid. SRF - subretinal fluid. EZ - ellipsoid zone. ERM - epiretinal membrane.  ORA - outer retinal atrophy. ORT - outer retinal tubulation. SRHM - subretinal hyper-reflective material. IRHM - intraretinal hyper-reflective material           ASSESSMENT/PLAN:   ICD-10-CM   1. Epiretinal membrane (ERM) of both eyes  H35.373 OCT, Retina - OU - Both Eyes    2. Essential hypertension  I10     3. Hypertensive retinopathy of both eyes  H35.033     4. Combined forms of age-related cataract of both eyes  H25.813      Epiretinal membrane, both eyes  - The natural history, anatomy, potential for loss of vision, and treatment options including vitrectomy techniques and the complications of  endophthalmitis, retinal detachment, vitreous hemorrhage, cataract progression and permanent vision loss discussed with the patient. - mild ERM OU - BCVA 20/25 OU - OD: +metamorphopsia - OS: asymptomatic, no metamorphopsia - OCT shows OD: ERM with pucker and PRF greatest superior macula, OS: Mild ERM with early pucker inferior macula - no indication for surgery at this time - monitor for now - f/u 3-4 mos -- DFE/OCT  2,3. Hypertensive retinopathy OU - discussed importance of tight BP control - monitor  4. Mixed Cataract OU - The symptoms of cataract, surgical options, and treatments and risks were discussed with patient. - discussed diagnosis and progression - monitor  Ophthalmic Meds Ordered this visit:  No orders of the defined types were placed in this encounter.    Return for f/u 3-4 months, ERM OU, DFE, OCT.  There are no Patient Instructions on file for this visit.  Explained the diagnoses, plan, and follow up with the patient and they expressed understanding.  Patient expressed understanding of the importance of proper follow up care.   This document serves as a record of services personally performed by Jeanice Millard, MD, PhD. It was created on their behalf by Morley Arabia. Bevin Bucks, OA an ophthalmic technician. The creation of this record is the provider's dictation and/or activities during the visit.    Electronically signed by: Morley Arabia. Bevin Bucks, OA 04/14/24 11:09 PM  Jeanice Millard, M.D., Ph.D. Diseases & Surgery of the Retina and Vitreous [facility] 04/12/2024  I have reviewed the above documentation for accuracy and completeness, and I agree with the above. Jeanice Millard, M.D., Ph.D. 04/14/24 11:12 PM   Abbreviations: M myopia (nearsighted); A astigmatism; H hyperopia (farsighted); P presbyopia; Mrx spectacle prescription;  CTL contact lenses; OD right eye; OS left eye; OU both eyes  XT exotropia; ET esotropia; PEK punctate epithelial  keratitis; PEE punctate epithelial erosions; DES dry eye syndrome; MGD meibomian gland dysfunction; ATs artificial tears; PFAT's preservative free artificial tears; NSC nuclear sclerotic cataract; PSC posterior subcapsular cataract; ERM epi-retinal membrane; PVD posterior vitreous detachment; RD retinal detachment; DM diabetes mellitus; DR diabetic retinopathy; NPDR non-proliferative diabetic retinopathy; PDR proliferative diabetic retinopathy; CSME clinically significant macular edema; DME diabetic macular edema; dbh dot blot hemorrhages; CWS cotton wool spot; POAG primary open angle glaucoma; C/D cup-to-disc ratio; HVF humphrey visual field; GVF goldmann visual field; OCT optical coherence tomography; IOP intraocular pressure; BRVO Branch retinal vein occlusion; CRVO central retinal vein occlusion; CRAO central retinal artery occlusion; BRAO branch retinal artery occlusion; RT retinal tear; SB scleral buckle; PPV pars plana vitrectomy; VH Vitreous hemorrhage; PRP panretinal laser photocoagulation; IVK intravitreal kenalog; VMT vitreomacular traction; MH Macular hole;  NVD neovascularization of the disc; NVE neovascularization elsewhere; AREDS age related eye disease study; ARMD age related macular degeneration; POAG primary  open angle glaucoma; EBMD epithelial/anterior basement membrane dystrophy; ACIOL anterior chamber intraocular lens; IOL intraocular lens; PCIOL posterior chamber intraocular lens; Phaco/IOL phacoemulsification with intraocular lens placement; PRK photorefractive keratectomy; LASIK laser assisted in situ keratomileusis; HTN hypertension; DM diabetes mellitus; COPD chronic obstructive pulmonary disease

## 2024-04-14 ENCOUNTER — Encounter (INDEPENDENT_AMBULATORY_CARE_PROVIDER_SITE_OTHER): Payer: Self-pay | Admitting: Ophthalmology

## 2024-04-15 ENCOUNTER — Encounter (INDEPENDENT_AMBULATORY_CARE_PROVIDER_SITE_OTHER): Admitting: Ophthalmology

## 2024-04-22 DIAGNOSIS — H35371 Puckering of macula, right eye: Secondary | ICD-10-CM | POA: Diagnosis not present

## 2024-04-22 DIAGNOSIS — H532 Diplopia: Secondary | ICD-10-CM | POA: Diagnosis not present

## 2024-04-22 DIAGNOSIS — H2511 Age-related nuclear cataract, right eye: Secondary | ICD-10-CM | POA: Diagnosis not present

## 2024-05-20 ENCOUNTER — Ambulatory Visit: Admitting: Orthopaedic Surgery

## 2024-05-20 ENCOUNTER — Encounter: Payer: Self-pay | Admitting: Orthopaedic Surgery

## 2024-05-20 DIAGNOSIS — M5416 Radiculopathy, lumbar region: Secondary | ICD-10-CM

## 2024-05-20 DIAGNOSIS — S161XXD Strain of muscle, fascia and tendon at neck level, subsequent encounter: Secondary | ICD-10-CM
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
## 2024-05-25 ENCOUNTER — Other Ambulatory Visit: Payer: Self-pay | Admitting: Adult Health

## 2024-05-25 DIAGNOSIS — F419 Anxiety disorder, unspecified: Secondary | ICD-10-CM

## 2024-05-26 ENCOUNTER — Other Ambulatory Visit: Payer: Self-pay | Admitting: Adult Health

## 2024-05-26 DIAGNOSIS — I10 Essential (primary) hypertension: Secondary | ICD-10-CM

## 2024-05-27 NOTE — Telephone Encounter (Signed)
  The original prescription was discontinued on 03/13/2024 by Alto Atta, NP. Renewing this prescription may not be appropriate.   Does pt need to start back on 5mg  or keep it at 7.5 mg.

## 2024-05-28 ENCOUNTER — Other Ambulatory Visit: Payer: Self-pay | Admitting: Adult Health

## 2024-05-28 DIAGNOSIS — I10 Essential (primary) hypertension: Secondary | ICD-10-CM

## 2024-06-12 NOTE — Progress Notes (Signed)
 [facility] Quality Team Note  Name: Karen Pope Date of Birth: 22-Oct-1961 MRN: [id number] Date: 06/12/2024  [facility] Quality Team has reviewed this patient's chart, please see recommendations below:  [facility] Quality Other; (CHART REVIEWED. ABSTRACTED MOST RECENT CBP.)

## 2024-07-04 NOTE — Progress Notes (Shared)
 [facility] - Clinic Note  07/15/2024   CHIEF COMPLAINT Patient presents for No chief complaint on file.  HISTORY OF PRESENT ILLNESS: Karen Pope is a 63 y.o. female who presents to the clinic today for:   Pt is here on the referral of Dr. Jacques for concern of ERM OU, pt states she was referred to Dr. Jacques by Dr. Marcey due to double vision x2 years, she was told by Dr. Jacques that she has diplopia in each eye individually and both eyes together, cataracts and macular degeneration, pt states she noticed a change in her vision this week, she states with or without glasses, her vision is blurry, she sees a little distortion in straight lines with her right eye, pt states she sees floaters that look like dark dots in both eyes   Referring physician: Merna Huxley, NP [address] [place],  [place] [address]  HISTORICAL INFORMATION:  Selected notes from the MEDICAL RECORD NUMBER Referred by Dr. Jacques for retina eval LEE:  Ocular Hx- PMH-   CURRENT MEDICATIONS: Current Outpatient Medications (Ophthalmic Drugs)  Medication Sig   Polyvinyl Alcohol-Povidone (REFRESH OP) Apply to eye.   No current facility-administered medications for this visit. (Ophthalmic Drugs)   Current Outpatient Medications (Other)  Medication Sig   acetaminophen  (TYLENOL ) 500 MG tablet Take 1,000 mg by mouth every 6 (six) hours as needed for moderate pain.   albuterol  (VENTOLIN  HFA) 108 (90 Base) MCG/ACT inhaler INHALE 2 PUFFS BY MOUTH EVERY 6 HOURS AS NEEDED   ALPRAZolam  (XANAX ) 0.25 MG tablet Take 1 tablet (0.25 mg total) by mouth at bedtime as needed for anxiety.   amLODipine  (NORVASC ) 2.5 MG tablet TAKE 1 TABLET BY MOUTH EVERY DAY   amLODipine  (NORVASC ) 5 MG tablet TAKE 1 TABLET (5 MG TOTAL) BY MOUTH DAILY. TAKE WITH 2.5 MG DOSE   baclofen  (LIORESAL ) 10 MG tablet Take 1 tablet (10 mg total) by mouth 3 (three) times daily.   buPROPion  (WELLBUTRIN  XL) 150 MG 24 hr tablet  TAKE 1 TABLET BY MOUTH EVERY DAY   Cholecalciferol (VITAMIN D3) 2000 units TABS Take 2,000 Units by mouth every other day.    escitalopram  (LEXAPRO ) 20 MG tablet TAKE 1 TABLET BY MOUTH EVERY DAY   fluticasone  (FLONASE ) 50 MCG/ACT nasal spray INSTILL 1 SPRAY INTO BOTH NOSTRILS DAILY (Patient taking differently: as needed.)   gabapentin  (NEURONTIN ) 300 MG capsule Take 1 capsule (300 mg total) by mouth at bedtime.   hydrochlorothiazide  (HYDRODIURIL ) 12.5 MG tablet Take 2 tablets (25 mg total) by mouth daily.   ibuprofen (ADVIL,MOTRIN) 200 MG tablet Take 200-400 mg by mouth every 6 (six) hours as needed for moderate pain.   levonorgestrel  (MIRENA , 52 MG,) 20 MCG/DAY IUD    methocarbamol  (ROBAXIN -750) 750 MG tablet Take 1 tablet (750 mg total) by mouth every 8 (eight) hours as needed for muscle spasms.   methylPREDNISolone  (MEDROL ) 4 MG tablet Take as directed   Multiple Vitamin (MULTIVITAMIN) tablet Take 1 tablet by mouth daily.   Multiple Vitamins-Minerals (PRESERVISION AREDS PO) Take by mouth.   nystatin-triamcinolone (MYCOLOG II) cream APP EXT AA BID FOR 7 TO 14 DAYS   triamcinolone ointment (KENALOG) 0.1 % APPLY THIN LAYER TOPICALLY TO THE AFFECTED AREA TWICE DAILY FOR 7 DAYS AS NEEDED   Vitamin D , Ergocalciferol , (DRISDOL ) 1.25 MG (50000 UNIT) CAPS capsule Take 1 capsule (50,000 Units total) by mouth every 7 (seven) days.   No current facility-administered medications for this visit. (Other)  REVIEW OF SYSTEMS:   ALLERGIES Allergies  Allergen Reactions   Lisinopril  Swelling   Paremyd [Hydroxyamphetamine-Tropicamide]     Chest pain   Phenylephrine      Chest pain   Thimerosal (Thiomersal) Itching   PAST MEDICAL HISTORY Past Medical History:  Diagnosis Date   Allergy    Arthritis    knees   Cataract    forming both eyes   Claustrophobia    Diplopia    essential hypertension    Lump or mass in breast    Obesity, unspecified    Other disorder of menstruation and other  abnormal bleeding from female genital tract    Other speech disturbance(784.59)    Urinary incontinence    Urticaria, unspecified    Past Surgical History:  Procedure Laterality Date   BREAST BIOPSY     benign    CHOLECYSTECTOMY N/A 10/07/2018   Procedure: LAPAROSCOPIC CHOLECYSTECTOMY WITH INTRAOPERATIVE CHOLANGIOGRAM;  Surgeon: Eletha Boas, MD;  Location: [facility];  Service: General;  Laterality: N/A;   COLONOSCOPY     hysteroscopy  12/02/2022   TONSILLECTOMY  1979   TUBAL LIGATION  1997   WISDOM TOOTH EXTRACTION     1980's   FAMILY HISTORY Family History  Problem Relation Age of Onset   Breast cancer Mother 84   Leukemia Maternal Aunt        leukemia- type cancer   Breast cancer Maternal Aunt 87   Prostate cancer Maternal Uncle    Cancer Paternal Aunt        either uterine or cervical cancer   Pancreatic cancer Cousin        52    Prostate cancer Half-Brother    Colon cancer Other        in his 87's   Pancreatic cancer Other        in his 92's   Colon polyps Neg Hx    Esophageal cancer Neg Hx    Rectal cancer Neg Hx    Stomach cancer Neg Hx    SOCIAL HISTORY Social History   Tobacco Use   Smoking status: Never   Smokeless tobacco: Never  Vaping Use   Vaping status: Never Used  Substance Use Topics   Alcohol use: Yes    Comment: rare- 1 x q 4 months   Drug use: No       OPHTHALMIC EXAM:  Not recorded    IMAGING AND PROCEDURES  Imaging and Procedures for 07/15/2024         ASSESSMENT/PLAN:   ICD-10-CM   1. Epiretinal membrane (ERM) of both eyes  H35.373     2. Essential hypertension  I10     3. Hypertensive retinopathy of both eyes  H35.033     4. Combined forms of age-related cataract of both eyes  H25.813       Epiretinal membrane, both eyes  - The natural history, anatomy, potential for loss of vision, and treatment options including vitrectomy techniques and the complications of endophthalmitis, retinal detachment, vitreous hemorrhage,  cataract progression and permanent vision loss discussed with the patient. - mild ERM OU - BCVA 20/25 OU - OD: +metamorphopsia - OS: asymptomatic, no metamorphopsia - OCT shows OD: ERM with pucker and PRF greatest superior macula, OS: Mild ERM with early pucker inferior macula - no indication for surgery at this time - monitor for now - f/u 3-4 mos -- DFE/OCT  2,3. Hypertensive retinopathy OU - discussed importance of tight BP control - monitor  4. Mixed  Cataract OU - The symptoms of cataract, surgical options, and treatments and risks were discussed with patient. - discussed diagnosis and progression - monitor  Ophthalmic Meds Ordered this visit:  No orders of the defined types were placed in this encounter.    No follow-ups on file.  There are no Patient Instructions on file for this visit.  Explained the diagnoses, plan, and follow up with the patient and they expressed understanding.  Patient expressed understanding of the importance of proper follow up care.   This document serves as a record of services personally performed by Redell JUDITHANN Hans, MD, PhD. It was created on their behalf by Avelina Pereyra, COA an ophthalmic technician. The creation of this record is the provider's dictation and/or activities during the visit.   Electronically signed by: Avelina GORMAN Pereyra, COT  07/04/24  2:31 PM    Redell JUDITHANN Hans, M.D., Ph.D. Diseases & Surgery of the Retina and Vitreous [facility] 07/15/2024     Abbreviations: M myopia (nearsighted); A astigmatism; H hyperopia (farsighted); P presbyopia; Mrx spectacle prescription;  CTL contact lenses; OD right eye; OS left eye; OU both eyes  XT exotropia; ET esotropia; PEK punctate epithelial keratitis; PEE punctate epithelial erosions; DES dry eye syndrome; MGD meibomian gland dysfunction; ATs artificial tears; PFAT's preservative free artificial tears; NSC nuclear sclerotic cataract; PSC posterior subcapsular  cataract; ERM epi-retinal membrane; PVD posterior vitreous detachment; RD retinal detachment; DM diabetes mellitus; DR diabetic retinopathy; NPDR non-proliferative diabetic retinopathy; PDR proliferative diabetic retinopathy; CSME clinically significant macular edema; DME diabetic macular edema; dbh dot blot hemorrhages; CWS cotton wool spot; POAG primary open angle glaucoma; C/D cup-to-disc ratio; HVF humphrey visual field; GVF goldmann visual field; OCT optical coherence tomography; IOP intraocular pressure; BRVO Branch retinal vein occlusion; CRVO central retinal vein occlusion; CRAO central retinal artery occlusion; BRAO branch retinal artery occlusion; RT retinal tear; SB scleral buckle; PPV pars plana vitrectomy; VH Vitreous hemorrhage; PRP panretinal laser photocoagulation; IVK intravitreal kenalog; VMT vitreomacular traction; MH Macular hole;  NVD neovascularization of the disc; NVE neovascularization elsewhere; AREDS age related eye disease study; ARMD age related macular degeneration; POAG primary open angle glaucoma; EBMD epithelial/anterior basement membrane dystrophy; ACIOL anterior chamber intraocular lens; IOL intraocular lens; PCIOL posterior chamber intraocular lens; Phaco/IOL phacoemulsification with intraocular lens placement; PRK photorefractive keratectomy; LASIK laser assisted in situ keratomileusis; HTN hypertension; DM diabetes mellitus; COPD chronic obstructive pulmonary disease

## 2024-07-05 ENCOUNTER — Other Ambulatory Visit: Payer: Self-pay | Admitting: Adult Health

## 2024-07-05 DIAGNOSIS — F32A Depression, unspecified: Secondary | ICD-10-CM

## 2024-07-10 DIAGNOSIS — Z9049 Acquired absence of other specified parts of digestive tract: Secondary | ICD-10-CM | POA: Diagnosis not present

## 2024-07-10 DIAGNOSIS — S199XXA Unspecified injury of neck, initial encounter: Secondary | ICD-10-CM | POA: Diagnosis not present

## 2024-07-10 DIAGNOSIS — Y92415 Exit ramp or entrance ramp of street or highway as the place of occurrence of the external cause: Secondary | ICD-10-CM | POA: Diagnosis not present

## 2024-07-10 DIAGNOSIS — M4802 Spinal stenosis, cervical region: Secondary | ICD-10-CM | POA: Diagnosis not present

## 2024-07-10 DIAGNOSIS — G9389 Other specified disorders of brain: Secondary | ICD-10-CM | POA: Diagnosis not present

## 2024-07-10 DIAGNOSIS — R220 Localized swelling, mass and lump, head: Secondary | ICD-10-CM | POA: Diagnosis not present

## 2024-07-10 DIAGNOSIS — N3289 Other specified disorders of bladder: Secondary | ICD-10-CM | POA: Diagnosis not present

## 2024-07-10 DIAGNOSIS — M542 Cervicalgia: Secondary | ICD-10-CM | POA: Diagnosis not present

## 2024-07-10 DIAGNOSIS — R519 Headache, unspecified: Secondary | ICD-10-CM | POA: Diagnosis not present

## 2024-07-10 DIAGNOSIS — S0990XA Unspecified injury of head, initial encounter: Secondary | ICD-10-CM | POA: Diagnosis not present

## 2024-07-10 DIAGNOSIS — R1010 Upper abdominal pain, unspecified: Secondary | ICD-10-CM | POA: Diagnosis not present

## 2024-07-12 ENCOUNTER — Encounter: Payer: Self-pay | Admitting: Adult Health

## 2024-07-12 ENCOUNTER — Ambulatory Visit (INDEPENDENT_AMBULATORY_CARE_PROVIDER_SITE_OTHER): Admitting: Adult Health

## 2024-07-12 VITALS — BP 120/80 | HR 78 | Temp 98.1°F | Ht 67.0 in | Wt 223.0 lb

## 2024-07-12 DIAGNOSIS — I10 Essential (primary) hypertension: Secondary | ICD-10-CM

## 2024-07-12 DIAGNOSIS — S060X0A Concussion without loss of consciousness, initial encounter: Secondary | ICD-10-CM

## 2024-07-12 DIAGNOSIS — M542 Cervicalgia: Secondary | ICD-10-CM

## 2024-07-12 DIAGNOSIS — R52 Pain, unspecified: Secondary | ICD-10-CM

## 2024-07-12 MED ORDER — SPIRONOLACTONE 25 MG PO TABS: 25.0000 mg | ORAL_TABLET | Freq: Every day | ORAL | 3 refills | Status: AC
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
## 2024-07-13 LAB — BASIC METABOLIC PANEL WITH GFR
BUN/Creatinine Ratio: 15 (ref 12–28)
BUN: 16 mg/dL (ref 8–27)
CO2: 23 mmol/L (ref 20–29)
Calcium: 10.9 mg/dL — ABNORMAL HIGH (ref 8.7–10.3)
Chloride: 107 mmol/L — ABNORMAL HIGH (ref 96–106)
Creatinine, Ser: 1.07 mg/dL — ABNORMAL HIGH (ref 0.57–1.00)
Glucose: 87 mg/dL (ref 70–99)
Potassium: 3.9 mmol/L (ref 3.5–5.2)
Sodium: 144 mmol/L (ref 134–144)
eGFR: 58 mL/min/1.73 — ABNORMAL LOW (ref >59)

## 2024-07-15 ENCOUNTER — Encounter (INDEPENDENT_AMBULATORY_CARE_PROVIDER_SITE_OTHER): Payer: Self-pay

## 2024-07-15 ENCOUNTER — Other Ambulatory Visit (HOSPITAL_BASED_OUTPATIENT_CLINIC_OR_DEPARTMENT_OTHER): Payer: Self-pay | Admitting: Obstetrics and Gynecology

## 2024-07-15 ENCOUNTER — Encounter (INDEPENDENT_AMBULATORY_CARE_PROVIDER_SITE_OTHER): Admitting: Ophthalmology

## 2024-07-15 DIAGNOSIS — H25813 Combined forms of age-related cataract, bilateral: Secondary | ICD-10-CM

## 2024-07-15 DIAGNOSIS — Z01419 Encounter for gynecological examination (general) (routine) without abnormal findings: Secondary | ICD-10-CM | POA: Diagnosis not present

## 2024-07-15 DIAGNOSIS — Z124 Encounter for screening for malignant neoplasm of cervix: Secondary | ICD-10-CM | POA: Diagnosis not present

## 2024-07-15 DIAGNOSIS — Z1330 Encounter for screening examination for mental health and behavioral disorders, unspecified: Secondary | ICD-10-CM | POA: Diagnosis not present

## 2024-07-15 DIAGNOSIS — I10 Essential (primary) hypertension: Secondary | ICD-10-CM

## 2024-07-15 DIAGNOSIS — H35373 Puckering of macula, bilateral: Secondary | ICD-10-CM

## 2024-07-15 DIAGNOSIS — H35033 Hypertensive retinopathy, bilateral: Secondary | ICD-10-CM

## 2024-07-15 DIAGNOSIS — Z13820 Encounter for screening for osteoporosis: Secondary | ICD-10-CM

## 2024-07-15 LAB — HM PAP SMEAR

## 2024-07-16 ENCOUNTER — Ambulatory Visit: Payer: Self-pay | Admitting: Adult Health

## 2024-07-22 NOTE — Progress Notes (Shared)
 [facility] - Clinic Note  08/05/2024   CHIEF COMPLAINT Patient presents for No chief complaint on file.  HISTORY OF PRESENT ILLNESS: Karen Pope is a 63 y.o. female who presents to the clinic today for:   Pt is here on the referral of Dr. Jacques for concern of ERM OU, pt states she was referred to Dr. Jacques by Dr. Marcey due to double vision x2 years, she was told by Dr. Jacques that she has diplopia in each eye individually and both eyes together, cataracts and macular degeneration, pt states she noticed a change in her vision this week, she states with or without glasses, her vision is blurry, she sees a little distortion in straight lines with her right eye, pt states she sees floaters that look like dark dots in both eyes   Referring physician: Merna Huxley, NP [address] [place],  [place] [address]  HISTORICAL INFORMATION:  Selected notes from the MEDICAL RECORD NUMBER Referred by Dr. Jacques for retina eval LEE:  Ocular Hx- PMH-   CURRENT MEDICATIONS: Current Outpatient Medications (Ophthalmic Drugs)  Medication Sig   Polyvinyl Alcohol-Povidone (REFRESH OP) Apply to eye.   No current facility-administered medications for this visit. (Ophthalmic Drugs)   Current Outpatient Medications (Other)  Medication Sig   acetaminophen  (TYLENOL ) 500 MG tablet Take 1,000 mg by mouth every 6 (six) hours as needed for moderate pain.   albuterol  (VENTOLIN  HFA) 108 (90 Base) MCG/ACT inhaler INHALE 2 PUFFS BY MOUTH EVERY 6 HOURS AS NEEDED   ALPRAZolam  (XANAX ) 0.25 MG tablet Take 1 tablet (0.25 mg total) by mouth at bedtime as needed for anxiety.   amLODipine  (NORVASC ) 5 MG tablet TAKE 1 TABLET (5 MG TOTAL) BY MOUTH DAILY. TAKE WITH 2.5 MG DOSE   buPROPion  (WELLBUTRIN  XL) 150 MG 24 hr tablet TAKE 1 TABLET BY MOUTH EVERY DAY   Cholecalciferol (VITAMIN D3) 2000 units TABS Take 2,000 Units by mouth every other day.    escitalopram  (LEXAPRO ) 20 MG tablet TAKE  1 TABLET BY MOUTH EVERY DAY   fluticasone  (FLONASE ) 50 MCG/ACT nasal spray INSTILL 1 SPRAY INTO BOTH NOSTRILS DAILY (Patient taking differently: as needed.)   gabapentin  (NEURONTIN ) 300 MG capsule Take 1 capsule (300 mg total) by mouth at bedtime.   ibuprofen (ADVIL,MOTRIN) 200 MG tablet Take 200-400 mg by mouth every 6 (six) hours as needed for moderate pain.   levonorgestrel  (MIRENA , 52 MG,) 20 MCG/DAY IUD    Multiple Vitamin (MULTIVITAMIN) tablet Take 1 tablet by mouth daily.   Multiple Vitamins-Minerals (PRESERVISION AREDS PO) Take by mouth.   nystatin-triamcinolone (MYCOLOG II) cream APP EXT AA BID FOR 7 TO 14 DAYS   spironolactone  (ALDACTONE ) 25 MG tablet Take 1 tablet (25 mg total) by mouth daily.   triamcinolone ointment (KENALOG) 0.1 % APPLY THIN LAYER TOPICALLY TO THE AFFECTED AREA TWICE DAILY FOR 7 DAYS AS NEEDED   No current facility-administered medications for this visit. (Other)   REVIEW OF SYSTEMS:   ALLERGIES Allergies  Allergen Reactions   Lisinopril  Swelling   Paremyd [Hydroxyamphetamine-Tropicamide]     Chest pain   Phenylephrine      Chest pain   Thimerosal (Thiomersal) Itching   PAST MEDICAL HISTORY Past Medical History:  Diagnosis Date   Allergy    Arthritis    knees   Cataract    forming both eyes   Claustrophobia    Diplopia    essential hypertension    Lump or mass in breast  Obesity, unspecified    Other disorder of menstruation and other abnormal bleeding from female genital tract    Other speech disturbance(784.59)    Urinary incontinence    Urticaria, unspecified    Past Surgical History:  Procedure Laterality Date   BREAST BIOPSY     benign    CHOLECYSTECTOMY N/A 10/07/2018   Procedure: LAPAROSCOPIC CHOLECYSTECTOMY WITH INTRAOPERATIVE CHOLANGIOGRAM;  Surgeon: Eletha Boas, MD;  Location: [facility];  Service: General;  Laterality: N/A;   COLONOSCOPY     hysteroscopy  12/02/2022   TONSILLECTOMY  1979   TUBAL LIGATION  1997   WISDOM  TOOTH EXTRACTION     1980's   FAMILY HISTORY Family History  Problem Relation Age of Onset   Breast cancer Mother 61   Leukemia Maternal Aunt        leukemia- type cancer   Breast cancer Maternal Aunt 87   Prostate cancer Maternal Uncle    Cancer Paternal Aunt        either uterine or cervical cancer   Pancreatic cancer Cousin        2    Prostate cancer Half-Brother    Colon cancer Other        in his 31's   Pancreatic cancer Other        in his 85's   Colon polyps Neg Hx    Esophageal cancer Neg Hx    Rectal cancer Neg Hx    Stomach cancer Neg Hx    SOCIAL HISTORY Social History   Tobacco Use   Smoking status: Never   Smokeless tobacco: Never  Vaping Use   Vaping status: Never Used  Substance Use Topics   Alcohol use: Yes    Comment: rare- 1 x q 4 months   Drug use: No       OPHTHALMIC EXAM:  Not recorded    IMAGING AND PROCEDURES  Imaging and Procedures for 08/05/2024         ASSESSMENT/PLAN:   ICD-10-CM   1. Epiretinal membrane (ERM) of both eyes  H35.373     2. Essential hypertension  I10     3. Hypertensive retinopathy of both eyes  H35.033     4. Combined forms of age-related cataract of both eyes  H25.813        Epiretinal membrane, both eyes  - The natural history, anatomy, potential for loss of vision, and treatment options including vitrectomy techniques and the complications of endophthalmitis, retinal detachment, vitreous hemorrhage, cataract progression and permanent vision loss discussed with the patient. - mild ERM OU - BCVA 20/25 OU - OD: +metamorphopsia - OS: asymptomatic, no metamorphopsia - OCT shows OD: ERM with pucker and PRF greatest superior macula, OS: Mild ERM with early pucker inferior macula - no indication for surgery at this time - monitor for now - f/u 3-4 mos -- DFE/OCT  2,3. Hypertensive retinopathy OU - discussed importance of tight BP control - monitor  4. Mixed Cataract OU - The symptoms of cataract,  surgical options, and treatments and risks were discussed with patient. - discussed diagnosis and progression - monitor  Ophthalmic Meds Ordered this visit:  No orders of the defined types were placed in this encounter.    No follow-ups on file.  There are no Patient Instructions on file for this visit.  Explained the diagnoses, plan, and follow up with the patient and they expressed understanding.  Patient expressed understanding of the importance of proper follow up care.   This document  serves as a record of services personally performed by Redell JUDITHANN Hans, MD, PhD. It was created on their behalf by Avelina Pereyra, COA an ophthalmic technician. The creation of this record is the provider's dictation and/or activities during the visit.   Electronically signed by: Avelina GORMAN Pereyra, COT  07/22/24  12:00 PM    Redell JUDITHANN Hans, M.D., Ph.D. Diseases & Surgery of the Retina and Vitreous [facility] 08/05/2024     Abbreviations: M myopia (nearsighted); A astigmatism; H hyperopia (farsighted); P presbyopia; Mrx spectacle prescription;  CTL contact lenses; OD right eye; OS left eye; OU both eyes  XT exotropia; ET esotropia; PEK punctate epithelial keratitis; PEE punctate epithelial erosions; DES dry eye syndrome; MGD meibomian gland dysfunction; ATs artificial tears; PFAT's preservative free artificial tears; NSC nuclear sclerotic cataract; PSC posterior subcapsular cataract; ERM epi-retinal membrane; PVD posterior vitreous detachment; RD retinal detachment; DM diabetes mellitus; DR diabetic retinopathy; NPDR non-proliferative diabetic retinopathy; PDR proliferative diabetic retinopathy; CSME clinically significant macular edema; DME diabetic macular edema; dbh dot blot hemorrhages; CWS cotton wool spot; POAG primary open angle glaucoma; C/D cup-to-disc ratio; HVF humphrey visual field; GVF goldmann visual field; OCT optical coherence tomography; IOP intraocular pressure;  BRVO Branch retinal vein occlusion; CRVO central retinal vein occlusion; CRAO central retinal artery occlusion; BRAO branch retinal artery occlusion; RT retinal tear; SB scleral buckle; PPV pars plana vitrectomy; VH Vitreous hemorrhage; PRP panretinal laser photocoagulation; IVK intravitreal kenalog; VMT vitreomacular traction; MH Macular hole;  NVD neovascularization of the disc; NVE neovascularization elsewhere; AREDS age related eye disease study; ARMD age related macular degeneration; POAG primary open angle glaucoma; EBMD epithelial/anterior basement membrane dystrophy; ACIOL anterior chamber intraocular lens; IOL intraocular lens; PCIOL posterior chamber intraocular lens; Phaco/IOL phacoemulsification with intraocular lens placement; PRK photorefractive keratectomy; LASIK laser assisted in situ keratomileusis; HTN hypertension; DM diabetes mellitus; COPD chronic obstructive pulmonary disease

## 2024-07-26 ENCOUNTER — Encounter: Payer: Self-pay | Admitting: Adult Health

## 2024-07-26 ENCOUNTER — Telehealth (INDEPENDENT_AMBULATORY_CARE_PROVIDER_SITE_OTHER): Admitting: Adult Health

## 2024-07-26 VITALS — BP 151/85 | HR 72 | Temp 99.5°F | Ht 67.0 in

## 2024-07-26 DIAGNOSIS — U071 COVID-19: Secondary | ICD-10-CM

## 2024-07-26 MED ORDER — NIRMATRELVIR/RITONAVIR (PAXLOVID)TABLET: 3.0000 | ORAL_TABLET | Freq: Two times a day (BID) | ORAL | 0 refills | Status: AC

## 2024-07-26 MED ORDER — ONDANSETRON HCL 4 MG PO TABS: 4.0000 mg | ORAL_TABLET | Freq: Three times a day (TID) | ORAL | 0 refills | Status: AC | PRN

## 2024-07-26 MED ORDER — HYDROCODONE BIT-HOMATROP MBR 5-1.5 MG/5ML PO SOLN: 5.0000 mL | Freq: Three times a day (TID) | ORAL | 0 refills | Status: DC | PRN

## 2024-07-26 MED ORDER — BENZONATATE 200 MG PO CAPS: 200.0000 mg | ORAL_CAPSULE | Freq: Three times a day (TID) | ORAL | 1 refills | Status: DC | PRN

## 2024-07-26 MED ORDER — PREDNISONE 10 MG PO TABS
10.0000 mg | ORAL_TABLET | Freq: Every day | ORAL | 0 refills | Status: DC
Start: 2024-07-26 — End: 2024-10-03

## 2024-07-26 NOTE — Progress Notes (Signed)
 Virtual Visit via Video Note  I connected with Karen Pope  on 07/26/24 at  4:00 PM EDT by a video enabled telemedicine application and verified that I am speaking with the correct person using two identifiers.  Location patient: home Location provider:work or home office Persons participating in the virtual visit: patient, provider  I discussed the limitations of evaluation and management by telemedicine and the availability of in person appointments. The patient expressed understanding and agreed to proceed.   HPI: 63 year old female who  has a past medical history of Allergy, Arthritis, Cataract, Claustrophobia, Diplopia, essential hypertension, Lump or mass in breast, Obesity, unspecified, Other disorder of menstruation and other abnormal bleeding from female genital tract, Other speech disturbance(784.59), Urinary incontinence, and Urticaria, unspecified.  She is being evaluated today for an acute visit. She reports testing positive for Covid 19 about 2 days ago. Her symptoms started the day prior.   Her symptoms include that of semi productive cough, headache, fevers up 103, chills, nausea, sweats, diarrhea, SOB, body aches, joint pain and fatigue.   Her husband also has Covid but his symptoms are not as bad.    ROS: See pertinent positives and negatives per HPI.  Past Medical History:  Diagnosis Date   Allergy    Arthritis    knees   Cataract    forming both eyes   Claustrophobia    Diplopia    essential hypertension    Lump or mass in breast    Obesity, unspecified    Other disorder of menstruation and other abnormal bleeding from female genital tract    Other speech disturbance(784.59)    Urinary incontinence    Urticaria, unspecified     Past Surgical History:  Procedure Laterality Date   BREAST BIOPSY     benign    CHOLECYSTECTOMY N/A 10/07/2018   Procedure: LAPAROSCOPIC CHOLECYSTECTOMY WITH INTRAOPERATIVE CHOLANGIOGRAM;  Surgeon: Eletha Boas, MD;  Location:  [facility];  Service: General;  Laterality: N/A;   COLONOSCOPY     hysteroscopy  12/02/2022   TONSILLECTOMY  1979   TUBAL LIGATION  1997   WISDOM TOOTH EXTRACTION     1980's    Family History  Problem Relation Age of Onset   Breast cancer Mother 10   Leukemia Maternal Aunt        leukemia- type cancer   Breast cancer Maternal Aunt 87   Prostate cancer Maternal Uncle    Cancer Paternal Aunt        either uterine or cervical cancer   Pancreatic cancer Cousin        31    Prostate cancer Half-Brother    Colon cancer Other        in his 21's   Pancreatic cancer Other        in his 54's   Colon polyps Neg Hx    Esophageal cancer Neg Hx    Rectal cancer Neg Hx    Stomach cancer Neg Hx        Current Outpatient Medications:    acetaminophen  (TYLENOL ) 500 MG tablet, Take 1,000 mg by mouth every 6 (six) hours as needed for moderate pain., Disp: , Rfl:    albuterol  (VENTOLIN  HFA) 108 (90 Base) MCG/ACT inhaler, INHALE 2 PUFFS BY MOUTH EVERY 6 HOURS AS NEEDED, Disp: 8.5 each, Rfl: 1   ALPRAZolam  (XANAX ) 0.25 MG tablet, Take 1 tablet (0.25 mg total) by mouth at bedtime as needed for anxiety., Disp: 30 tablet, Rfl: 0  amLODipine  (NORVASC ) 5 MG tablet, TAKE 1 TABLET (5 MG TOTAL) BY MOUTH DAILY. TAKE WITH 2.5 MG DOSE, Disp: 90 tablet, Rfl: 1   buPROPion  (WELLBUTRIN  XL) 150 MG 24 hr tablet, TAKE 1 TABLET BY MOUTH EVERY DAY, Disp: 90 tablet, Rfl: 1   Cholecalciferol (VITAMIN D3) 2000 units TABS, Take 2,000 Units by mouth every other day. , Disp: , Rfl:    escitalopram  (LEXAPRO ) 20 MG tablet, TAKE 1 TABLET BY MOUTH EVERY DAY, Disp: 90 tablet, Rfl: 1   fluticasone  (FLONASE ) 50 MCG/ACT nasal spray, INSTILL 1 SPRAY INTO BOTH NOSTRILS DAILY (Patient taking differently: as needed.), Disp: 24 mL, Rfl: 1   gabapentin  (NEURONTIN ) 300 MG capsule, Take 1 capsule (300 mg total) by mouth at bedtime., Disp: 30 capsule, Rfl: 1   ibuprofen (ADVIL,MOTRIN) 200 MG tablet, Take 200-400 mg by mouth every 6 (six)  hours as needed for moderate pain., Disp: , Rfl:    levonorgestrel  (MIRENA , 52 MG,) 20 MCG/DAY IUD, , Disp: , Rfl:    Multiple Vitamin (MULTIVITAMIN) tablet, Take 1 tablet by mouth daily., Disp: , Rfl:    Multiple Vitamins-Minerals (PRESERVISION AREDS PO), Take by mouth., Disp: , Rfl:    nystatin-triamcinolone (MYCOLOG II) cream, APP EXT AA BID FOR 7 TO 14 DAYS, Disp: , Rfl:    Polyvinyl Alcohol-Povidone (REFRESH OP), Apply to eye., Disp: , Rfl:    spironolactone  (ALDACTONE ) 25 MG tablet, Take 1 tablet (25 mg total) by mouth daily., Disp: 90 tablet, Rfl: 3   triamcinolone ointment (KENALOG) 0.1 %, APPLY THIN LAYER TOPICALLY TO THE AFFECTED AREA TWICE DAILY FOR 7 DAYS AS NEEDED, Disp: , Rfl:   EXAM:  VITALS per patient if applicable:  GENERAL: alert, oriented, appears tired.   HEENT: atraumatic, conjunttiva clear, no obvious abnormalities on inspection of external nose and ears  NECK: normal movements of the head and neck  LUNGS: on inspection no signs of respiratory distress, breathing rate appears normal, no obvious gross SOB, gasping or wheezing  CV: no obvious cyanosis  MS: moves all visible extremities without noticeable abnormality  PSYCH/NEURO: pleasant and cooperative, no obvious depression or anxiety, speech and thought processing grossly intact  ASSESSMENT AND PLAN:  Discussed the following assessment and plan:  1. COVID-19 virus infection (Primary) -  - nirmatrelvir /ritonavir  (PAXLOVID ) 20 x 150 MG & 10 x 100MG  TABS; Take 3 tablets by mouth 2 (two) times daily for 5 days. (Take nirmatrelvir  150 mg two tablets twice daily for 5 days and ritonavir  100 mg one tablet twice daily for 5 days) Patient GFR is >60  Dispense: 30 tablet; Refill: 0 - ondansetron  (ZOFRAN ) 4 MG tablet; Take 1 tablet (4 mg total) by mouth every 8 (eight) hours as needed for nausea or vomiting.  Dispense: 20 tablet; Refill: 0 - benzonatate  (TESSALON ) 200 MG capsule; Take 1 capsule (200 mg total) by mouth  3 (three) times daily as needed.  Dispense: 30 capsule; Refill: 1 - HYDROcodone  bit-homatropine (HYCODAN) 5-1.5 MG/5ML syrup; Take 5 mLs by mouth every 8 (eight) hours as needed for cough.  Dispense: 120 mL; Refill: 0 - predniSONE  (DELTASONE ) 10 MG tablet; Take 1 tablet (10 mg total) by mouth daily with breakfast.  Dispense: 5 tablet; Refill: 0   - Follow up if not improving in the next 2-3 days      I discussed the assessment and treatment plan with the patient. The patient was provided an opportunity to ask questions and all were answered. The patient agreed with the plan and  demonstrated an understanding of the instructions.   The patient was advised to call back or seek an in-person evaluation if the symptoms worsen or if the condition fails to improve as anticipated.   Trystan Akhtar, NP

## 2024-08-02 ENCOUNTER — Ambulatory Visit: Admitting: Adult Health

## 2024-08-05 ENCOUNTER — Encounter (INDEPENDENT_AMBULATORY_CARE_PROVIDER_SITE_OTHER): Admitting: Ophthalmology

## 2024-08-05 ENCOUNTER — Encounter (INDEPENDENT_AMBULATORY_CARE_PROVIDER_SITE_OTHER): Payer: Self-pay

## 2024-08-05 DIAGNOSIS — H35033 Hypertensive retinopathy, bilateral: Secondary | ICD-10-CM

## 2024-08-05 DIAGNOSIS — H35373 Puckering of macula, bilateral: Secondary | ICD-10-CM

## 2024-08-05 DIAGNOSIS — H25813 Combined forms of age-related cataract, bilateral: Secondary | ICD-10-CM

## 2024-08-05 DIAGNOSIS — I10 Essential (primary) hypertension: Secondary | ICD-10-CM
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
## 2024-08-19 ENCOUNTER — Ambulatory Visit (INDEPENDENT_AMBULATORY_CARE_PROVIDER_SITE_OTHER): Admitting: Ophthalmology

## 2024-08-19 ENCOUNTER — Encounter (INDEPENDENT_AMBULATORY_CARE_PROVIDER_SITE_OTHER): Payer: Self-pay | Admitting: Ophthalmology

## 2024-08-19 DIAGNOSIS — H35373 Puckering of macula, bilateral: Secondary | ICD-10-CM | POA: Diagnosis not present

## 2024-08-19 DIAGNOSIS — H25813 Combined forms of age-related cataract, bilateral: Secondary | ICD-10-CM

## 2024-08-19 DIAGNOSIS — I10 Essential (primary) hypertension: Secondary | ICD-10-CM | POA: Diagnosis not present

## 2024-08-19 DIAGNOSIS — H35033 Hypertensive retinopathy, bilateral: Secondary | ICD-10-CM

## 2024-08-22 ENCOUNTER — Encounter (INDEPENDENT_AMBULATORY_CARE_PROVIDER_SITE_OTHER): Payer: Self-pay | Admitting: Ophthalmology

## 2024-08-29 ENCOUNTER — Other Ambulatory Visit: Payer: Self-pay | Admitting: Adult Health

## 2024-08-29 DIAGNOSIS — I10 Essential (primary) hypertension: Secondary | ICD-10-CM

## 2024-08-29 NOTE — Telephone Encounter (Signed)
  The original prescription was discontinued on 07/12/2024 by Merna Huxley, NP. Renewing this prescription may not be appropriate.

## 2024-09-03 ENCOUNTER — Other Ambulatory Visit: Payer: Self-pay | Admitting: Obstetrics and Gynecology

## 2024-09-03 DIAGNOSIS — Z0000 Encounter for general adult medical examination without abnormal findings: Secondary | ICD-10-CM

## 2024-09-16 ENCOUNTER — Ambulatory Visit
Admission: RE | Admit: 2024-09-16 | Discharge: 2024-09-16 | Disposition: A | Discharge status: Home / Self Care | Source: Ambulatory Visit | Attending: Obstetrics and Gynecology | Admitting: Obstetrics and Gynecology

## 2024-09-16 DIAGNOSIS — Z0000 Encounter for general adult medical examination without abnormal findings: Secondary | ICD-10-CM

## 2024-09-16 DIAGNOSIS — Z1231 Encounter for screening mammogram for malignant neoplasm of breast: Secondary | ICD-10-CM | POA: Diagnosis not present

## 2024-09-21 ENCOUNTER — Other Ambulatory Visit: Payer: Self-pay | Admitting: Adult Health

## 2024-10-03 ENCOUNTER — Ambulatory Visit (INDEPENDENT_AMBULATORY_CARE_PROVIDER_SITE_OTHER): Admitting: Adult Health

## 2024-10-03 ENCOUNTER — Other Ambulatory Visit: Payer: Self-pay | Admitting: Adult Health

## 2024-10-03 VITALS — BP 120/70 | HR 75 | Temp 98.1°F | Ht 67.0 in | Wt 221.0 lb

## 2024-10-03 DIAGNOSIS — I10 Essential (primary) hypertension: Secondary | ICD-10-CM | POA: Diagnosis not present

## 2024-10-03 DIAGNOSIS — J069 Acute upper respiratory infection, unspecified: Secondary | ICD-10-CM

## 2024-10-03 DIAGNOSIS — Z23 Encounter for immunization: Secondary | ICD-10-CM

## 2024-10-03 NOTE — Progress Notes (Unsigned)
 Subjective:    Patient ID: Karen Pope, female    DOB: 09-16-1961, 63 y.o.   MRN: [id number]  HPI 63 year old female who  has a past medical history of Allergy, Arthritis, Cataract, Claustrophobia, Diplopia, essential hypertension, Lump or mass in breast, Obesity, unspecified, Other disorder of menstruation and other abnormal bleeding from female genital tract, Other speech disturbance(784.59), Urinary incontinence, and Urticaria, unspecified.  She presents to the office today for follow-up regarding hypertension.  She was last seen in August 2025 at which time she had increased lower extremity edema after we increased her Norvasc  from 5 to 7.5 mg daily.  I decreased her dose back to 5 mg daily, added spironolactone  25 mg daily and DC'd hydrochlorothiazide .  Today she reports that she is taking her Norvasc  5 mg daily and does not have any significant lower extremity edema unless she falls asleep in her easy chair.  She is not taking her spironolactone  every day but when she does take it she does not have any lower extremity edema.  She has been eating healthier, but work has been stressful and she has not been able to exercise.  She is not checking her blood pressure at home.  Additionally she reports that last 3 to 4 months she has had 3 separate URIs.  She had a upper respiratory infection in early August with chest congestion and a productive cough, then caught COVID-19 and her symptoms lasted roughly 2 weeks and then a few weeks later she ended up with another URI with a productive cough and chest congestion.  She has been symptom-free for about a week and a half for a little bit longer and is feeling back to baseline.  She is wondering why she has been having so many upper respiratory infections   Review of Systems See HPI   Past Medical History:  Diagnosis Date   Allergy    Arthritis    knees   Cataract    forming both eyes   Claustrophobia    Diplopia    essential hypertension     Lump or mass in breast    Obesity, unspecified    Other disorder of menstruation and other abnormal bleeding from female genital tract    Other speech disturbance(784.59)    Urinary incontinence    Urticaria, unspecified     Social History   Socioeconomic History   Marital status: Married    Spouse name: Not on file   Number of children: Not on file   Years of education: Not on file   Highest education level: Master's degree (e.g., MA, MS, MEng, MEd, MSW, MBA)  Occupational History   Not on file  Tobacco Use   Smoking status: Never   Smokeless tobacco: Never  Vaping Use   Vaping status: Never Used  Substance and Sexual Activity   Alcohol use: Yes    Comment: rare- 1 x q 4 months   Drug use: No   Sexual activity: Not on file  Other Topics Concern   Not on file  Social History Narrative   Not on file   Social Drivers of Health   Financial Resource Strain: Low Risk  (01/06/2023)   Overall Financial Resource Strain (CARDIA)    Difficulty of Paying Living Expenses: Not very hard  Food Insecurity: No Food Insecurity (01/06/2023)   Hunger Vital Sign    Worried About Running Out of Food in the Last Year: Never true    Ran Out of Food  in the Last Year: Never true  Transportation Needs: No Transportation Needs (01/06/2023)   PRAPARE - Administrator, Civil Service (Medical): No    Lack of Transportation (Non-Medical): No  Physical Activity: Insufficiently Active (01/06/2023)   Exercise Vital Sign    Days of Exercise per Week: 1 day    Minutes of Exercise per Session: 10 min  Stress: No Stress Concern Present (01/06/2023)   [employer] of Occupational Health - Occupational Stress Questionnaire    Feeling of Stress : Not at all  Social Connections: Unknown (01/06/2023)   Social Connection and Isolation Panel    Frequency of Communication with Friends and Family: Three times a week    Frequency of Social Gatherings with Friends and Family: Patient declined     Attends Religious Services: Patient declined    Active Member of Clubs or Organizations: Yes    Attends Engineer, structural: More than 4 times per year    Marital Status: Not on file  Intimate Partner Violence: Not on file    Past Surgical History:  Procedure Laterality Date   BREAST BIOPSY     benign    CHOLECYSTECTOMY N/A 10/07/2018   Procedure: LAPAROSCOPIC CHOLECYSTECTOMY WITH INTRAOPERATIVE CHOLANGIOGRAM;  Surgeon: Eletha Boas, MD;  Location: [facility];  Service: General;  Laterality: N/A;   COLONOSCOPY     hysteroscopy  12/02/2022   TONSILLECTOMY  1979   TUBAL LIGATION  1997   WISDOM TOOTH EXTRACTION     1980's    Family History  Problem Relation Age of Onset   Breast cancer Mother 74   Leukemia Maternal Aunt        leukemia- type cancer   Breast cancer Maternal Aunt 87   Prostate cancer Maternal Uncle    Cancer Paternal Aunt        either uterine or cervical cancer   Pancreatic cancer Cousin        49    Prostate cancer Half-Brother    Colon cancer Other        in his 70's   Pancreatic cancer Other        in his 17's   Colon polyps Neg Hx    Esophageal cancer Neg Hx    Rectal cancer Neg Hx    Stomach cancer Neg Hx     Allergies  Allergen Reactions   Lisinopril  Swelling   Paremyd [Hydroxyamphetamine-Tropicamide]     Chest pain   Phenylephrine      Chest pain   Thimerosal (Thiomersal) Itching    Current Outpatient Medications on File Prior to Visit  Medication Sig Dispense Refill   acetaminophen  (TYLENOL ) 500 MG tablet Take 1,000 mg by mouth every 6 (six) hours as needed for moderate pain.     albuterol  (VENTOLIN  HFA) 108 (90 Base) MCG/ACT inhaler INHALE 2 PUFFS BY MOUTH EVERY 6 HOURS AS NEEDED 8.5 each 1   ALPRAZolam  (XANAX ) 0.25 MG tablet Take 1 tablet (0.25 mg total) by mouth at bedtime as needed for anxiety. 30 tablet 0   benzonatate  (TESSALON ) 200 MG capsule Take 1 capsule (200 mg total) by mouth 3 (three) times daily as needed. 30 capsule 1    buPROPion  (WELLBUTRIN  XL) 150 MG 24 hr tablet TAKE 1 TABLET BY MOUTH EVERY DAY 90 tablet 1   Cholecalciferol (VITAMIN D3) 2000 units TABS Take 2,000 Units by mouth every other day.      escitalopram  (LEXAPRO ) 20 MG tablet TAKE 1 TABLET BY MOUTH EVERY DAY 90  tablet 1   fluticasone  (FLONASE ) 50 MCG/ACT nasal spray INSTILL 1 SPRAY INTO BOTH NOSTRILS DAILY 24 mL 1   ibuprofen (ADVIL,MOTRIN) 200 MG tablet Take 200-400 mg by mouth every 6 (six) hours as needed for moderate pain.     levonorgestrel  (MIRENA , 52 MG,) 20 MCG/DAY IUD      Multiple Vitamin (MULTIVITAMIN) tablet Take 1 tablet by mouth daily.     Multiple Vitamins-Minerals (PRESERVISION AREDS PO) Take by mouth.     nystatin-triamcinolone (MYCOLOG II) cream APP EXT AA BID FOR 7 TO 14 DAYS     ondansetron  (ZOFRAN ) 4 MG tablet Take 1 tablet (4 mg total) by mouth every 8 (eight) hours as needed for nausea or vomiting. 20 tablet 0   Polyvinyl Alcohol-Povidone (REFRESH OP) Apply to eye.     spironolactone  (ALDACTONE ) 25 MG tablet Take 1 tablet (25 mg total) by mouth daily. 90 tablet 3   triamcinolone ointment (KENALOG) 0.1 % APPLY THIN LAYER TOPICALLY TO THE AFFECTED AREA TWICE DAILY FOR 7 DAYS AS NEEDED     gabapentin  (NEURONTIN ) 300 MG capsule Take 1 capsule (300 mg total) by mouth at bedtime. (Patient not taking: Reported on 10/03/2024) 30 capsule 1   HYDROcodone  bit-homatropine (HYCODAN) 5-1.5 MG/5ML syrup Take 5 mLs by mouth every 8 (eight) hours as needed for cough. (Patient not taking: Reported on 08/19/2024) 120 mL 0   predniSONE  (DELTASONE ) 10 MG tablet Take 1 tablet (10 mg total) by mouth daily with breakfast. (Patient not taking: Reported on 08/19/2024) 5 tablet 0   No current facility-administered medications on file prior to visit.    BP 120/70   Pulse 75   Temp 98.1 F (36.7 C) (Oral)   Ht 5' 7 (1.702 m)   Wt 221 lb (100.2 kg)   SpO2 95%   BMI 34.61 kg/m       Objective:   Physical Exam Vitals and nursing note reviewed.   Constitutional:      Appearance: Normal appearance.  HENT:     Right Ear: Tympanic membrane, ear canal and external ear normal. There is no impacted cerumen.     Left Ear: Tympanic membrane, ear canal and external ear normal. There is no impacted cerumen.     Mouth/Throat:     Mouth: Mucous membranes are moist.     Pharynx: Oropharynx is clear.  Cardiovascular:     Rate and Rhythm: Normal rate and regular rhythm.     Pulses: Normal pulses.     Heart sounds: Normal heart sounds.  Pulmonary:     Effort: Pulmonary effort is normal.     Breath sounds: Normal breath sounds.  Musculoskeletal:        General: Normal range of motion.     Right lower leg: Edema (trace) present.     Left lower leg: Edema (trace) present.  Skin:    General: Skin is warm and dry.  Neurological:     General: No focal deficit present.     Mental Status: She is alert and oriented to person, place, and time.  Psychiatric:        Mood and Affect: Mood normal.        Behavior: Behavior normal.        Thought Content: Thought content normal.        Judgment: Judgment normal.       Assessment & Plan:  1. Primary hypertension (Primary) - Her BP is at goal today.  - Encouraged to take Spirolactone more frequently.  -  Basic Metabolic Panel; Future  2. 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  - Encouraged to sleep in bed, take time for her self, eat healthy and exercise  3. Need for influenza vaccination  - Flu vaccine HIGH DOSE PF(Fluzone Trivalent)  Shruthi Northrup, NP  I personally spent a total of 36 minutes in the care of the patient today including preparing to see the patient, getting/reviewing separately obtained history, performing a medically appropriate exam/evaluation, counseling and educating, placing orders,  and documenting clinical information in the EHR.

## 2024-10-07 ENCOUNTER — Other Ambulatory Visit

## 2024-10-07 DIAGNOSIS — I10 Essential (primary) hypertension: Secondary | ICD-10-CM | POA: Diagnosis not present

## 2024-10-07 LAB — BASIC METABOLIC PANEL WITH GFR
BUN: 19 mg/dL (ref 6–23)
CO2: 30 meq/L (ref 19–32)
Calcium: 10.3 mg/dL (ref 8.4–10.5)
Chloride: 103 meq/L (ref 96–112)
Creatinine, Ser: 1.26 mg/dL — ABNORMAL HIGH (ref 0.40–1.20)
GFR: 44.83 mL/min — ABNORMAL LOW (ref >60.00)
Glucose, Bld: 82 mg/dL (ref 70–99)
Potassium: 4.5 meq/L (ref 3.5–5.1)
Sodium: 138 meq/L (ref 135–145)

## 2024-10-08 ENCOUNTER — Ambulatory Visit: Payer: Self-pay | Admitting: Adult Health

## 2024-10-08 DIAGNOSIS — N289 Disorder of kidney and ureter, unspecified: Secondary | ICD-10-CM

## 2024-10-10 ENCOUNTER — Encounter: Payer: Self-pay | Admitting: Adult Health

## 2024-10-11 NOTE — Telephone Encounter (Signed)
 FYI

## 2024-10-14 ENCOUNTER — Encounter: Payer: Self-pay | Admitting: Radiology

## 2024-10-15 ENCOUNTER — Other Ambulatory Visit: Payer: Self-pay | Admitting: Adult Health

## 2024-10-15 DIAGNOSIS — N289 Disorder of kidney and ureter, unspecified: Secondary | ICD-10-CM

## 2024-10-15 DIAGNOSIS — R519 Headache, unspecified: Secondary | ICD-10-CM

## 2024-10-16 NOTE — Telephone Encounter (Unsigned)
 Copied from CRM [id number]. Topic: Clinical - Lab/Test Results >> Oct 10, 2024  4:13 PM Burnard DEL wrote: Reason for CRM: Patient returned call regarding lab results.Message from provider was relayed to patient .Patient verbalized understanding and is okay with referral.

## 2024-10-21 ENCOUNTER — Ambulatory Visit
Admission: RE | Admit: 2024-10-21 | Discharge: 2024-10-21 | Disposition: A | Source: Ambulatory Visit | Attending: Adult Health

## 2024-10-21 DIAGNOSIS — N189 Chronic kidney disease, unspecified: Secondary | ICD-10-CM | POA: Diagnosis not present

## 2024-10-21 DIAGNOSIS — N289 Disorder of kidney and ureter, unspecified: Secondary | ICD-10-CM

## 2024-10-23 ENCOUNTER — Ambulatory Visit: Payer: Self-pay | Admitting: Adult Health

## 2024-11-12 ENCOUNTER — Other Ambulatory Visit: Payer: Self-pay | Admitting: Adult Health

## 2024-11-12 DIAGNOSIS — R519 Headache, unspecified: Secondary | ICD-10-CM

## 2024-11-18 ENCOUNTER — Ambulatory Visit: Admitting: Sports Medicine

## 2024-11-18 VITALS — BP 140/70 | Ht 67.0 in | Wt 221.0 lb

## 2024-11-18 DIAGNOSIS — M542 Cervicalgia: Secondary | ICD-10-CM

## 2024-11-18 DIAGNOSIS — S46811A Strain of other muscles, fascia and tendons at shoulder and upper arm level, right arm, initial encounter: Secondary | ICD-10-CM

## 2024-11-18 DIAGNOSIS — G4486 Cervicogenic headache: Secondary | ICD-10-CM | POA: Diagnosis not present

## 2024-11-18 MED ORDER — MELOXICAM 15 MG PO TABS: 15.0000 mg | ORAL_TABLET | Freq: Every day | ORAL | 0 refills | Status: AC

## 2024-11-18 NOTE — Patient Instructions (Addendum)
-   Start meloxicam  15 mg daily x2 weeks.  If still having pain after 2 weeks, complete 3rd-week of NSAID. May use remaining NSAID as needed once daily for pain control.  Do not to use additional over-the-counter NSAIDs (ibuprofen, naproxen, Advil, Aleve, etc.) while taking prescription NSAIDs.  May use Tylenol  [phone] mg 2 to 3 times a day for breakthrough pain.  Discuss medication with nephrologist before starting meloxicam .   Neck and trap HEP. Follow up in 4 weeks
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
## 2024-11-25 LAB — LAB REPORT - SCANNED: EGFR: 60

## 2024-12-01 ENCOUNTER — Other Ambulatory Visit: Payer: Self-pay | Admitting: Adult Health

## 2024-12-01 DIAGNOSIS — F419 Anxiety disorder, unspecified: Secondary | ICD-10-CM
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
## 2024-12-16 ENCOUNTER — Ambulatory Visit: Admitting: Sports Medicine

## 2024-12-16 VITALS — HR 74 | Ht 67.0 in | Wt 220.0 lb

## 2024-12-16 DIAGNOSIS — G4486 Cervicogenic headache: Secondary | ICD-10-CM | POA: Diagnosis not present

## 2024-12-16 DIAGNOSIS — M542 Cervicalgia: Secondary | ICD-10-CM | POA: Diagnosis not present

## 2024-12-16 DIAGNOSIS — S46811D Strain of other muscles, fascia and tendons at shoulder and upper arm level, right arm, subsequent encounter: Secondary | ICD-10-CM

## 2024-12-16 MED ORDER — CELECOXIB 200 MG PO CAPS: 200.0000 mg | ORAL_CAPSULE | Freq: Two times a day (BID) | ORAL | 0 refills | Status: AC

## 2024-12-16 NOTE — Patient Instructions (Signed)
 MRI cervical   - Start Celebrex  200 mg 2x daily for 2 weeks .  If still having pain after 2 weeks, complete 3rd-week of NSAID. May use remaining NSAID as needed once daily for pain control.  Do not to use additional over-the-counter NSAIDs (ibuprofen, naproxen, Advil, Aleve, etc.) while taking prescription NSAIDs.  May use Tylenol  [phone] mg 2 to 3 times a day for breakthrough pain.  Follow up 1 week after to discuss results

## 2024-12-28 ENCOUNTER — Encounter: Payer: Self-pay | Admitting: Sports Medicine

## 2024-12-29 ENCOUNTER — Ambulatory Visit
Admission: RE | Admit: 2024-12-29 | Discharge: 2024-12-29 | Disposition: A | Source: Ambulatory Visit | Attending: Sports Medicine

## 2024-12-29 DIAGNOSIS — M542 Cervicalgia: Secondary | ICD-10-CM

## 2024-12-29 DIAGNOSIS — G4486 Cervicogenic headache: Secondary | ICD-10-CM

## 2024-12-29 DIAGNOSIS — S46811D Strain of other muscles, fascia and tendons at shoulder and upper arm level, right arm, subsequent encounter: Secondary | ICD-10-CM

## 2024-12-31 ENCOUNTER — Ambulatory Visit: Payer: Self-pay | Admitting: Sports Medicine

## 2024-12-31 ENCOUNTER — Ambulatory Visit: Admitting: Sports Medicine

## 2024-12-31 VITALS — HR 72 | Ht 67.0 in | Wt 223.0 lb

## 2024-12-31 DIAGNOSIS — M542 Cervicalgia: Secondary | ICD-10-CM | POA: Diagnosis not present

## 2024-12-31 DIAGNOSIS — S46811D Strain of other muscles, fascia and tendons at shoulder and upper arm level, right arm, subsequent encounter: Secondary | ICD-10-CM

## 2024-12-31 DIAGNOSIS — G4486 Cervicogenic headache: Secondary | ICD-10-CM

## 2024-12-31 NOTE — Patient Instructions (Addendum)
-   Use Celebrex  200 mg daily as needed for breakthrough pain.  Recommend limiting chronic NSAIDs to 1-2 doses per week to prevent long-term side effects. Use Tylenol  500 to 1000 mg tablets 2-3 times a day as needed for day-to-day pain relief.    Epidural right c6-7  Follow up 2 weeks after to discuss result   C-spine MRI 12/29/2024 IMPRESSION: 1. Small right paracentral disc protrusion at C4-5 without significant stenosis. The ventral right C5 nerve root could potentially be affected. 2. Right-sided facet arthrosis at C4-5 with associated mild reactive marrow edema. Finding could serve as a source for neck pain and referred right-sided symptoms. 3. Small central disc protrusion at C3-4 with resultant mild spinal stenosis. 4. Mild to moderate left C4, C5, and C7 stenosis related to disc bulge and uncovertebral disease.
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
## 2024-12-31 NOTE — Telephone Encounter (Signed)
 Spoke to patient and scheduled

## 2025-01-08 ENCOUNTER — Other Ambulatory Visit: Payer: Self-pay | Admitting: Adult Health

## 2025-01-08 DIAGNOSIS — F419 Anxiety disorder, unspecified: Secondary | ICD-10-CM

## 2025-01-14 ENCOUNTER — Encounter: Admitting: Adult Health

## 2025-03-24 ENCOUNTER — Encounter (INDEPENDENT_AMBULATORY_CARE_PROVIDER_SITE_OTHER): Admitting: Ophthalmology
